# Patient Record
Sex: Female | Born: 1970 | Race: White | Hispanic: No | Marital: Married | Smoking: Current every day smoker
Health system: Southern US, Community
[De-identification: ages and names within clinical notes are randomized; demographics above are authoritative.]

## PROBLEM LIST (undated history)

## (undated) DIAGNOSIS — F329 Major depressive disorder, single episode, unspecified: Secondary | ICD-10-CM

## (undated) DIAGNOSIS — K219 Gastro-esophageal reflux disease without esophagitis: Secondary | ICD-10-CM

## (undated) DIAGNOSIS — J45909 Unspecified asthma, uncomplicated: Secondary | ICD-10-CM

## (undated) DIAGNOSIS — Z720 Tobacco use: Secondary | ICD-10-CM

## (undated) DIAGNOSIS — I10 Essential (primary) hypertension: Secondary | ICD-10-CM

## (undated) DIAGNOSIS — D649 Anemia, unspecified: Secondary | ICD-10-CM

## (undated) DIAGNOSIS — F419 Anxiety disorder, unspecified: Secondary | ICD-10-CM

## (undated) DIAGNOSIS — F32A Depression, unspecified: Secondary | ICD-10-CM

## (undated) HISTORY — DX: Anemia, unspecified: D64.9

## (undated) HISTORY — DX: Essential (primary) hypertension: I10

## (undated) HISTORY — DX: Tobacco use: Z72.0

---

## 2003-09-30 HISTORY — PX: OTHER SURGICAL HISTORY: SHX169

## 2007-04-22 ENCOUNTER — Ambulatory Visit: Payer: Self-pay | Admitting: Family Medicine

## 2007-04-22 DIAGNOSIS — R946 Abnormal results of thyroid function studies: Secondary | ICD-10-CM | POA: Insufficient documentation

## 2007-04-23 ENCOUNTER — Encounter: Payer: Self-pay | Admitting: Family Medicine

## 2007-04-24 ENCOUNTER — Emergency Department (HOSPITAL_COMMUNITY): Admission: EM | Admit: 2007-04-24 | Discharge: 2007-04-24 | Payer: Self-pay | Admitting: Emergency Medicine

## 2007-05-06 ENCOUNTER — Encounter: Payer: Self-pay | Admitting: Family Medicine

## 2007-05-07 ENCOUNTER — Ambulatory Visit: Payer: Self-pay | Admitting: Family Medicine

## 2007-05-08 ENCOUNTER — Encounter: Payer: Self-pay | Admitting: Family Medicine

## 2007-05-12 ENCOUNTER — Encounter: Payer: Self-pay | Admitting: Family Medicine

## 2007-05-12 LAB — CONVERTED CEMR LAB
AST: 14 units/L (ref 0–37)
BUN: 13 mg/dL (ref 6–23)
CO2: 22 meq/L (ref 19–32)
Calcium: 9 mg/dL (ref 8.4–10.5)
Chloride: 107 meq/L (ref 96–112)
Cholesterol: 170 mg/dL (ref 0–200)
Creatinine, Ser: 0.77 mg/dL (ref 0.40–1.20)
Glucose, Bld: 93 mg/dL (ref 70–99)
HDL goal, serum: 40 mg/dL
HDL: 40 mg/dL (ref >39)
Total CHOL/HDL Ratio: 4.3
Triglycerides: 192 mg/dL — ABNORMAL HIGH (ref <150)

## 2007-05-14 ENCOUNTER — Encounter: Payer: Self-pay | Admitting: Family Medicine

## 2007-05-14 ENCOUNTER — Other Ambulatory Visit: Admission: RE | Admit: 2007-05-14 | Discharge: 2007-05-14 | Payer: Self-pay | Admitting: Family Medicine

## 2007-05-14 ENCOUNTER — Ambulatory Visit: Payer: Self-pay | Admitting: Family Medicine

## 2007-05-15 ENCOUNTER — Encounter: Payer: Self-pay | Admitting: Family Medicine

## 2007-05-22 ENCOUNTER — Encounter: Payer: Self-pay | Admitting: Family Medicine

## 2007-07-01 ENCOUNTER — Ambulatory Visit: Payer: Self-pay | Admitting: Family Medicine

## 2007-07-01 DIAGNOSIS — F17200 Nicotine dependence, unspecified, uncomplicated: Secondary | ICD-10-CM | POA: Insufficient documentation

## 2007-07-01 DIAGNOSIS — K219 Gastro-esophageal reflux disease without esophagitis: Secondary | ICD-10-CM | POA: Insufficient documentation

## 2007-07-07 ENCOUNTER — Telehealth: Payer: Self-pay | Admitting: Family Medicine

## 2007-07-22 ENCOUNTER — Encounter: Admission: RE | Admit: 2007-07-22 | Discharge: 2007-07-22 | Payer: Self-pay | Admitting: Family Medicine

## 2007-07-22 ENCOUNTER — Ambulatory Visit: Payer: Self-pay | Admitting: Family Medicine

## 2007-07-22 DIAGNOSIS — M549 Dorsalgia, unspecified: Secondary | ICD-10-CM | POA: Insufficient documentation

## 2007-07-22 DIAGNOSIS — K644 Residual hemorrhoidal skin tags: Secondary | ICD-10-CM | POA: Insufficient documentation

## 2007-08-26 ENCOUNTER — Ambulatory Visit: Payer: Self-pay | Admitting: Family Medicine

## 2007-10-07 ENCOUNTER — Telehealth: Payer: Self-pay | Admitting: Family Medicine

## 2007-11-04 ENCOUNTER — Ambulatory Visit: Payer: Self-pay | Admitting: Family Medicine

## 2007-11-04 DIAGNOSIS — R5383 Other fatigue: Secondary | ICD-10-CM | POA: Insufficient documentation

## 2007-11-04 DIAGNOSIS — R5381 Other malaise: Secondary | ICD-10-CM | POA: Insufficient documentation

## 2007-11-04 DIAGNOSIS — E781 Pure hyperglyceridemia: Secondary | ICD-10-CM | POA: Insufficient documentation

## 2007-12-09 ENCOUNTER — Ambulatory Visit: Payer: Self-pay | Admitting: Family Medicine

## 2007-12-24 ENCOUNTER — Encounter: Payer: Self-pay | Admitting: Family Medicine

## 2007-12-26 LAB — CONVERTED CEMR LAB
Eosinophils Absolute: 0.3 10*3/uL (ref 0.0–0.7)
Eosinophils Relative: 4 % (ref 0–5)
HCT: 35.9 % — ABNORMAL LOW (ref 36.0–46.0)
Lymphocytes Relative: 28 % (ref 12–46)
Lymphs Abs: 2.2 10*3/uL (ref 0.7–4.0)
MCV: 84.3 fL (ref 78.0–100.0)
Monocytes Relative: 11 % (ref 3–12)
RBC: 4.26 M/uL (ref 3.87–5.11)
WBC: 7.8 10*3/uL (ref 4.0–10.5)

## 2008-02-03 ENCOUNTER — Ambulatory Visit: Payer: Self-pay | Admitting: Family Medicine

## 2008-02-06 ENCOUNTER — Encounter: Payer: Self-pay | Admitting: Family Medicine

## 2008-02-16 ENCOUNTER — Telehealth: Payer: Self-pay | Admitting: Family Medicine

## 2008-03-25 ENCOUNTER — Ambulatory Visit: Payer: Self-pay | Admitting: Family Medicine

## 2008-05-25 ENCOUNTER — Ambulatory Visit: Payer: Self-pay | Admitting: Family Medicine

## 2008-05-25 DIAGNOSIS — R7309 Other abnormal glucose: Secondary | ICD-10-CM | POA: Insufficient documentation

## 2008-05-25 LAB — CONVERTED CEMR LAB: Hgb A1c MFr Bld: 5.9 %

## 2008-06-07 ENCOUNTER — Telehealth: Payer: Self-pay | Admitting: Family Medicine

## 2008-06-09 ENCOUNTER — Ambulatory Visit: Payer: Self-pay | Admitting: Family Medicine

## 2008-06-09 LAB — CONVERTED CEMR LAB
Bilirubin Urine: NEGATIVE
Blood in Urine, dipstick: NEGATIVE
Glucose, Urine, Semiquant: 250
Nitrite: NEGATIVE
Specific Gravity, Urine: 1.02
Urobilinogen, UA: 0.2

## 2008-06-21 ENCOUNTER — Telehealth: Payer: Self-pay | Admitting: Family Medicine

## 2008-08-19 ENCOUNTER — Encounter: Payer: Self-pay | Admitting: Family Medicine

## 2008-08-19 ENCOUNTER — Other Ambulatory Visit: Admission: RE | Admit: 2008-08-19 | Discharge: 2008-08-19 | Payer: Self-pay | Admitting: Family Medicine

## 2008-08-19 ENCOUNTER — Ambulatory Visit: Payer: Self-pay | Admitting: Family Medicine

## 2008-08-19 LAB — CONVERTED CEMR LAB: Beta hcg, urine, semiquantitative: NEGATIVE

## 2008-08-19 LAB — HM PAP SMEAR

## 2008-08-24 ENCOUNTER — Telehealth: Payer: Self-pay | Admitting: Family Medicine

## 2008-10-28 ENCOUNTER — Ambulatory Visit: Payer: Self-pay | Admitting: Family Medicine

## 2008-12-09 ENCOUNTER — Ambulatory Visit: Payer: Self-pay | Admitting: Family Medicine

## 2008-12-09 ENCOUNTER — Telehealth (INDEPENDENT_AMBULATORY_CARE_PROVIDER_SITE_OTHER): Payer: Self-pay | Admitting: *Deleted

## 2008-12-09 LAB — CONVERTED CEMR LAB
Ketones, urine, test strip: NEGATIVE
Nitrite: NEGATIVE
Protein, U semiquant: NEGATIVE
WBC Urine, dipstick: NEGATIVE
pH: 6

## 2008-12-14 ENCOUNTER — Telehealth: Payer: Self-pay | Admitting: Family Medicine

## 2008-12-22 ENCOUNTER — Ambulatory Visit: Payer: Self-pay | Admitting: Family Medicine

## 2008-12-22 LAB — CONVERTED CEMR LAB
Bilirubin Urine: NEGATIVE
Nitrite: NEGATIVE
Protein, U semiquant: NEGATIVE
Urobilinogen, UA: 0.2
WBC Urine, dipstick: NEGATIVE
pH: 5.5

## 2008-12-23 ENCOUNTER — Encounter: Payer: Self-pay | Admitting: Family Medicine

## 2008-12-26 LAB — CONVERTED CEMR LAB
ALT: 14 units/L (ref 0–35)
BUN: 10 mg/dL (ref 6–23)
CO2: 21 meq/L (ref 19–32)
Calcium: 9.3 mg/dL (ref 8.4–10.5)
Chloride: 107 meq/L (ref 96–112)
Cholesterol: 169 mg/dL (ref 0–200)
Creatinine, Ser: 0.76 mg/dL (ref 0.40–1.20)
Glucose, Bld: 96 mg/dL (ref 70–99)
HDL: 33 mg/dL — ABNORMAL LOW (ref >39)
Hep B C IgM: NEGATIVE
RPR Ser Ql: REACTIVE — AB
T pallidum Antibodies (TP-PA): <0.1 (ref <1.0)
TSH: 1.039 microintl units/mL (ref 0.350–4.500)
Total Bilirubin: 0.4 mg/dL (ref 0.3–1.2)
Total CHOL/HDL Ratio: 5.1
Triglycerides: 190 mg/dL — ABNORMAL HIGH (ref <150)
VLDL: 38 mg/dL (ref 0–40)

## 2009-01-03 ENCOUNTER — Encounter: Admission: RE | Admit: 2009-01-03 | Discharge: 2009-01-03 | Payer: Self-pay | Admitting: Family Medicine

## 2009-01-10 ENCOUNTER — Telehealth: Payer: Self-pay | Admitting: Family Medicine

## 2009-04-20 ENCOUNTER — Ambulatory Visit: Payer: Self-pay | Admitting: Family Medicine

## 2009-04-20 LAB — CONVERTED CEMR LAB
Bilirubin Urine: NEGATIVE
Ketones, urine, test strip: NEGATIVE
Nitrite: NEGATIVE
Protein, U semiquant: NEGATIVE
pH: 6

## 2009-04-21 ENCOUNTER — Encounter: Payer: Self-pay | Admitting: Family Medicine

## 2009-05-09 ENCOUNTER — Ambulatory Visit: Payer: Self-pay | Admitting: Family Medicine

## 2009-05-09 LAB — CONVERTED CEMR LAB
Bilirubin Urine: NEGATIVE
Blood in Urine, dipstick: NEGATIVE
Glucose, Urine, Semiquant: NEGATIVE
Specific Gravity, Urine: 1.02
pH: 6.5

## 2009-05-10 ENCOUNTER — Encounter: Payer: Self-pay | Admitting: Family Medicine

## 2009-05-10 LAB — CONVERTED CEMR LAB
Clue Cells Wet Prep HPF POC: NONE SEEN
Trich, Wet Prep: NONE SEEN

## 2009-06-10 ENCOUNTER — Ambulatory Visit: Payer: Self-pay | Admitting: Family Medicine

## 2009-06-10 LAB — CONVERTED CEMR LAB
Nitrite: NEGATIVE
Protein, U semiquant: NEGATIVE
Urobilinogen, UA: 0.2
WBC Urine, dipstick: NEGATIVE

## 2009-07-04 ENCOUNTER — Telehealth: Payer: Self-pay | Admitting: Family Medicine

## 2009-07-07 ENCOUNTER — Ambulatory Visit (HOSPITAL_COMMUNITY): Admission: RE | Admit: 2009-07-07 | Discharge: 2009-07-07 | Payer: Self-pay | Admitting: Psychiatry

## 2009-07-12 ENCOUNTER — Telehealth: Payer: Self-pay | Admitting: Family

## 2009-07-13 ENCOUNTER — Ambulatory Visit (HOSPITAL_COMMUNITY): Payer: Self-pay | Admitting: Psychology

## 2009-07-22 ENCOUNTER — Ambulatory Visit: Payer: Self-pay | Admitting: Family Medicine

## 2009-07-22 ENCOUNTER — Encounter (INDEPENDENT_AMBULATORY_CARE_PROVIDER_SITE_OTHER): Payer: Self-pay | Admitting: *Deleted

## 2009-07-22 DIAGNOSIS — F332 Major depressive disorder, recurrent severe without psychotic features: Secondary | ICD-10-CM | POA: Insufficient documentation

## 2009-07-24 ENCOUNTER — Encounter: Payer: Self-pay | Admitting: Family Medicine

## 2009-07-27 ENCOUNTER — Encounter: Payer: Self-pay | Admitting: Family Medicine

## 2009-07-27 ENCOUNTER — Telehealth: Payer: Self-pay | Admitting: Family Medicine

## 2009-07-28 ENCOUNTER — Encounter: Payer: Self-pay | Admitting: Family Medicine

## 2009-08-03 ENCOUNTER — Telehealth (INDEPENDENT_AMBULATORY_CARE_PROVIDER_SITE_OTHER): Payer: Self-pay | Admitting: *Deleted

## 2009-08-09 ENCOUNTER — Ambulatory Visit (HOSPITAL_COMMUNITY): Payer: Self-pay | Admitting: Psychology

## 2009-08-22 ENCOUNTER — Ambulatory Visit: Payer: Self-pay | Admitting: Family Medicine

## 2009-08-23 ENCOUNTER — Ambulatory Visit (HOSPITAL_COMMUNITY): Payer: Self-pay | Admitting: Psychology

## 2009-09-06 ENCOUNTER — Ambulatory Visit (HOSPITAL_COMMUNITY): Payer: Self-pay | Admitting: Psychology

## 2009-09-09 ENCOUNTER — Encounter (INDEPENDENT_AMBULATORY_CARE_PROVIDER_SITE_OTHER): Payer: Self-pay | Admitting: *Deleted

## 2009-09-09 ENCOUNTER — Telehealth: Payer: Self-pay | Admitting: Family Medicine

## 2009-09-19 ENCOUNTER — Ambulatory Visit (HOSPITAL_COMMUNITY): Payer: Self-pay | Admitting: Psychology

## 2009-10-05 ENCOUNTER — Ambulatory Visit (HOSPITAL_COMMUNITY): Payer: Self-pay | Admitting: Psychiatry

## 2009-10-10 ENCOUNTER — Ambulatory Visit (HOSPITAL_COMMUNITY): Payer: Self-pay | Admitting: Psychology

## 2009-10-21 ENCOUNTER — Ambulatory Visit: Payer: Self-pay | Admitting: Family Medicine

## 2009-11-14 ENCOUNTER — Ambulatory Visit (HOSPITAL_COMMUNITY): Payer: Self-pay | Admitting: Psychology

## 2009-11-14 ENCOUNTER — Ambulatory Visit: Payer: Self-pay | Admitting: Family Medicine

## 2009-11-14 DIAGNOSIS — I10 Essential (primary) hypertension: Secondary | ICD-10-CM | POA: Insufficient documentation

## 2009-11-14 LAB — CONVERTED CEMR LAB
Nitrite: NEGATIVE
Protein, U semiquant: NEGATIVE
Urobilinogen, UA: 0.2
WBC Urine, dipstick: NEGATIVE
pH: 5.5

## 2009-11-15 ENCOUNTER — Encounter: Payer: Self-pay | Admitting: Family Medicine

## 2009-11-15 LAB — CONVERTED CEMR LAB

## 2009-12-16 ENCOUNTER — Ambulatory Visit (HOSPITAL_COMMUNITY): Payer: Self-pay | Admitting: Psychiatry

## 2010-01-16 ENCOUNTER — Ambulatory Visit (HOSPITAL_COMMUNITY): Payer: Self-pay | Admitting: Psychology

## 2010-01-17 ENCOUNTER — Ambulatory Visit: Payer: Self-pay | Admitting: Family Medicine

## 2010-02-14 ENCOUNTER — Ambulatory Visit (HOSPITAL_COMMUNITY): Admit: 2010-02-14 | Payer: Self-pay | Admitting: Psychology

## 2010-02-16 ENCOUNTER — Ambulatory Visit
Admission: RE | Admit: 2010-02-16 | Discharge: 2010-02-16 | Payer: Self-pay | Source: Home / Self Care | Admitting: Emergency Medicine

## 2010-02-17 ENCOUNTER — Ambulatory Visit (HOSPITAL_COMMUNITY)
Admission: RE | Admit: 2010-02-17 | Discharge: 2010-02-17 | Payer: Self-pay | Source: Home / Self Care | Attending: Psychiatry | Admitting: Psychiatry

## 2010-02-17 ENCOUNTER — Encounter: Payer: Self-pay | Admitting: Emergency Medicine

## 2010-02-19 ENCOUNTER — Encounter: Payer: Self-pay | Admitting: Family Medicine

## 2010-02-28 NOTE — Progress Notes (Signed)
Summary: pt needs a prior auth  Phone Note Call from Patient   Caller: Patient Summary of Call: Pt states that her her Effexor XR needs a prior Authorization [facility] [place] [facility] [address] Initial call taken by: Michaelle Copas,  August 03, 2009 3:44 PM  Follow-up for Phone Call        This was already completed  last week by Selena Batten Follow-up by: Payton Spark CMA,  August 04, 2009 1:49 PM

## 2010-02-28 NOTE — Consult Note (Signed)
Summary: [facility]  [facility]   Imported By: Lanelle Bal 08/18/2009 09:12:30  _____________________________________________________________________  External Attachment:    Type:   Image     Comment:   External Document

## 2010-02-28 NOTE — Letter (Signed)
Summary: Depression Questionnaire  Depression Questionnaire   Imported By: Lanelle Bal 08/29/2009 11:33:34  _____________________________________________________________________  External Attachment:    Type:   Image     Comment:   External Document

## 2010-02-28 NOTE — Progress Notes (Signed)
Summary: Question  Phone Note Call from Patient   Caller: Patient Summary of Call: Dr.Metheney Patient saw Dr.Bowen while Dr.Metheney was out, Patient was out on FMLA/short term dis and she need a Note to return back to work ..she is not sure if she need an appt or can she just get a note    [phone] Initial call taken by: Vanessa Swaziland,  September 09, 2009 1:08 PM  Follow-up for Phone Call        You can do a note. Just double check the date with Dr. Senaida Lange note. Does need a f/u at somepoint if not already scheduled.  Follow-up by: Nani Gasser MD,  September 09, 2009 1:53 PM  Additional Follow-up for Phone Call Additional follow up Details #1::        Letter done. Pt notified can pick up. Additional Follow-up by: Kathlene November,  September 09, 2009 2:04 PM

## 2010-02-28 NOTE — Letter (Signed)
Summary: Primary Care Consult Scheduled Letter  [facility] [place]  [address]   [place], [place] [address]   Phone: [phone]  Fax: [phone]      07/22/2009 MRN: [id number]  Brooke Kaiser [address] [place], [place]  [address]    Dear Brooke Kaiser,    We have scheduled an appointment for you.  At the recommendation of Dr.Bowen, we have scheduled you a consult with [facility] on June 30th  at 9:45.  Their address is [address] [place], [place] [address]. The office phone number is [phone].  If this appointment day and time is not convenient for you, please feel free to call the office of the doctor you are being referred to at the number listed above and reschedule the appointment.     It is important for you to keep your scheduled appointments. We are here to make sure you are given good patient care.    Thank you, Michaelle Copas #[phone] Patient Care Coordinator [facility] Kathryne Sharper

## 2010-02-28 NOTE — Assessment & Plan Note (Signed)
Summary: HTN, dysuria   Vital Signs:  Patient profile:   40 year old female Height:      68 inches Weight:      214 pounds Pulse rate:   84 / minute BP sitting:   146 / 88  (right arm) Cuff size:   regular  Vitals Entered By: Avon Gully CMA, Duncan Dull) (November 14, 2009 1:14 PM) CC: f/u bp at work it was 154/65 and has been around 142-145/75-80,also has burning with urination x several days and white thick discharge   Primary Care Shadasia [place]:  Nani Gasser MD  CC:  f/u bp at work it was 154/65 and has been around 142-145/75-80 and also has burning with urination x several days and white thick discharge.  History of Present Illness: f/u bp at work it was 154/65 and has been around 142-145/75-80,also has burning with urination x several days and white thick discharge  Current Medications (verified): 1)  Alprazolam 0.5 Mg Tabs (Alprazolam) .... Take 1 Tablet By Mouth Once A Day As Needed Anxiety 2)  Proair Hfa 108 (90 Base) Mcg/act  Aers (Albuterol Sulfate) .... 2-4 Puffs Inhaled Two Times A Day As Needed 3)  Pantoprazole Sodium 40 Mg Tbec (Pantoprazole Sodium) .... Take 1 Tablet By Mouth Once A Day 4)  Effexor Xr 150 Mg Xr24h-Cap (Venlafaxine Hcl) .... Take 1 Tablet By Mouth Once A Day  Allergies (verified): 1)  ! Codeine 2)  ! Augmentin  Comments:  Nurse/Medical Assistant: The patient's medications and allergies were reviewed with the patient and were updated in the Medication and Allergy Lists. Avon Gully CMA, Duncan Dull) (November 14, 2009 1:18 PM)  Physical Exam  General:  Well-developed,well-nourished,in no acute distress; alert,appropriate and cooperative throughout examination.  Lungs:  Normal respiratory effort, chest expands symmetrically. Lungs are clear to auscultation, no crackles or wheezes. Heart:  Normal rate and regular rhythm. S1 and S2 normal without gallop, murmur, click, rub or other extra sounds. Abdomen:  Umbilical peircing.  Skin:  no rashes.     Cervical Nodes:  No lymphadenopathy noted Psych:  Cognition and judgment appear intact. Alert and cooperative with normal attention span and concentration. No apparent delusions, illusions, hallucinations   Impression & Recommendations:  Problem # 1:  HYPERTENSION, BENIGN (ICD-401.1) She is working on quitting smoking. Start BP medication. Warned of potential SE.  Call if any concerns.  F/U in 6 weeks.  Her updated medication list for this problem includes:    Hydrochlorothiazide 25 Mg Tabs (Hydrochlorothiazide) .Marland Kitchen... Take 1 tablet by mouth once a day  Problem # 2:  DYSURIA (ICD-788.1) U/A is neg but started taking cranberry so will send a culture.   Will send wet prep as well since noticed a thick vaginal d/c.  Orders: T-Urine Culture (Spectrum Order) [id number]) T-Wet Prep for Christoper Allegra, Clue Cells [id number]) UA Dipstick w/o Micro (automated)  (81003)  Complete Medication List: 1)  Alprazolam 0.5 Mg Tabs (Alprazolam) .... Take 1 tablet by mouth once a day as needed anxiety 2)  Proair Hfa 108 (90 Base) Mcg/act Aers (Albuterol sulfate) .... 2-4 puffs inhaled two times a day as needed 3)  Pantoprazole Sodium 40 Mg Tbec (Pantoprazole sodium) .... Take 1 tablet by mouth once a day 4)  Effexor Xr 150 Mg Xr24h-cap (Venlafaxine hcl) .... Take 1 tablet by mouth once a day 5)  Hydrochlorothiazide 25 Mg Tabs (Hydrochlorothiazide) .... Take 1 tablet by mouth once a day  Patient Instructions: 1)  Google the DASH diet ( .nih/gov website) 2)  Regular exercse  ( days a week) and weight loss and smoking cessation will help the blood pressure. Also avoid caffeine.  3)  Please schedule a follow-up appointment in 6 weeks.  Prescriptions: HYDROCHLOROTHIAZIDE 25 MG TABS (HYDROCHLOROTHIAZIDE) Take 1 tablet by mouth once a day  #30 x 1   Entered and Authorized by:   Nani Gasser MD   Signed by:   Nani Gasser MD on 11/14/2009   Method used:   [facility]        [facility]  [place] [address]       [address]       [place], [place]  [address]       Ph: [phone] or [phone]       Fax: [phone]   RxID:   [id number]    Orders Added: 1)  T-Urine Culture (Spectrum Order) [[id number]] 2)  T-Wet Prep for Christoper Allegra, Clue Cells [phone] 3)  UA Dipstick w/o Micro (automated)  [81003] 4)  Est. Patient Level III [[address]]    Laboratory Results   Urine Tests  Date/Time Received: 11/14/09 Date/Time Reported: 11/14/09  Routine Urinalysis   Color: yellow Appearance: Clear Glucose: negative   (Normal Range: Negative) Bilirubin: negative   (Normal Range: Negative) Ketone: negative   (Normal Range: Negative) Spec. Gravity: >=1.030   (Normal Range: 1.003-1.035) Blood: negative   (Normal Range: Negative) pH: 5.5   (Normal Range: 5.0-8.0) Protein: negative   (Normal Range: Negative) Urobilinogen: 0.2   (Normal Range: 0-1) Nitrite: negative   (Normal Range: Negative) Leukocyte Esterace: negative   (Normal Range: Negative)

## 2010-02-28 NOTE — Letter (Signed)
Summary: Generic Letter  [facility] [place]  [address]   [place], [place] [address]   Phone: [phone]  Fax: [phone]    07/24/2009  Brooke Kaiser [address] [place], [place]  [address]  To Whom It May Concern,  Brooke Kaiser will be expected to be out of work from June 13th, 2011 until August 13th, 2011 due to severe depression with self mutilating behavior.  She has started the recovery process with psychiatric counseling and medications.  Based on clinical data and how this patient presents, I expect her to take two months to show clinical remission in order to return to work.        Sincerely,     Seymour Bars DO

## 2010-02-28 NOTE — Progress Notes (Signed)
Summary: referral  Phone Note Call from Patient   Caller: Patient Summary of Call: Patient said she need a Podiatry referral Initial call taken by: Vanessa Swaziland,  July 12, 2009 2:38 PM  Follow-up for Phone Call        Could you please ask patient to let us know why she needs referral? Follow-up by: Lemont Fillers FNP,  July 12, 2009 3:38 PM  Additional Follow-up for Phone Call Additional follow up Details #1::        [facility] for pt to return call. KJ LPN Additional Follow-up by: Kathlene November,  July 12, 2009 3:52 PM    Additional Follow-up for Phone Call Additional follow up Details #2::    Pt has not returned call. Will file in chart and await for pt to call if still wants a referral Follow-up by: Kathlene November,  July 14, 2009 8:55 AM

## 2010-02-28 NOTE — Assessment & Plan Note (Signed)
Summary: f/u mood   Vital Signs:  Patient profile:   40 year old female Height:      68 inches Weight:      210 pounds BMI:     32.05 O2 Sat:      99 % on Room air Pulse rate:   61 / minute BP sitting:   117 / 75  (left arm) Cuff size:   large  Vitals Entered By: Payton Spark CMA (July 22, 2009 9:43 AM)  O2 Flow:  Room air CC: Depression and anxiety. Also c/o painful wart on bottom of R foot.    Primary Care Provider:  Nani Gasser MD  CC:  Depression and anxiety. Also c/o painful wart on bottom of R foot. Brooke Kaiser  History of Present Illness: 40 yo WF presents for a painful wart on the bottom of her R foot that is getting worse.  She has not seen a podiatrist yet.  She reports having an emotional meltdown at work and doing some self harm.  She was admitted to [facility].  She was taking Cymbalta a year ago but stopped it due to cost about a year ago.  She takes Xanax 1-2 x a day as needed.    She reports cutting herself and was only at [facility] for a few hrs.  She was set up for outpt help but she did not go due to cost.  She has set up her own counseling that is covered thru Aug.  She would like to get back to work in Aug.    She has a roommate now that she is happy with.   June 10th -- went to [facility]    Current Medications (verified): 1)  Alprazolam 0.5 Mg Tabs (Alprazolam) .... Take 1 Tablet By Mouth Once A Day As Needed Anxiety 2)  Proair Hfa 108 (90 Base) Mcg/act  Aers (Albuterol Sulfate) .... 2-4 Puffs Inhaled Two Times A Day As Needed 3)  Pantoprazole Sodium 40 Mg Tbec (Pantoprazole Sodium) .... Take 1 Tablet By Mouth Once A Day  Allergies (verified): 1)  ! Codeine 2)  ! Augmentin  Review of Systems Psych:  Complains of anxiety, depression, easily angered, and irritability; denies alternate hallucination ( auditory/visual), suicidal thoughts/plans, thoughts of violence, unusual visions or sounds, and thoughts /plans of harming others.  Physical  Exam  General:  alert, well-developed, well-nourished, and well-hydrated.   Head:  normocephalic and atraumatic.   Mouth:  pharynx pink and moist.   Neck:  no masses.   Lungs:  Normal respiratory effort, chest expands symmetrically. Lungs are clear to auscultation, no crackles or wheezes. Heart:  Normal rate and regular rhythm. S1 and S2 normal without gallop, murmur, click, rub or other extra sounds. Skin:  large corn on the bottom of the R foot with callus over the medal arch Psych:  poor eye contact and labile affect.     Impression & Recommendations:  Problem # 1:  DEPRESSION, SEVERE (ICD-311) Reviewed her history of having a 'meltdown' with a cutting episode to her L wrist on the 10th of June.  She has been out of work since the 13th and spent a few hrs at [employer].  She is set up with counseling which she started on Monday, has some time off work and is in a better roomate relationship now.  She is not abusing her Xanax (1 per day).  Admits that she needs to get back on meds.  Will start generic effexor since  cost is an issue.  Call if ANY worsening in mood.  RTC in1 month and will do a PHQ-9. Her updated medication list for this problem includes:    Alprazolam 0.5 Mg Tabs (Alprazolam) .Brooke Kaiser... Take 1 tablet by mouth once a day as needed anxiety    Venlafaxine Hcl 37.5 Mg Tabs (Venlafaxine hcl) .Brooke Kaiser... 1 tab by mouth two times a day x 1 wk then increase to 2 tabs by mouth bid  Complete Medication List: 1)  Alprazolam 0.5 Mg Tabs (Alprazolam) .... Take 1 tablet by mouth once a day as needed anxiety 2)  Proair Hfa 108 (90 Base) Mcg/act Aers (Albuterol sulfate) .... 2-4 puffs inhaled two times a day as needed 3)  Pantoprazole Sodium 40 Mg Tbec (Pantoprazole sodium) .... Take 1 tablet by mouth once a day 4)  Venlafaxine Hcl 37.5 Mg Tabs (Venlafaxine hcl) .Brooke Kaiser.. 1 tab by mouth two times a day x 1 wk then increase to 2 tabs by mouth bid  Other Orders: Podiatry Referral (Podiatry)  Patient  Instructions: 1)  Start generic effexor - take 1 tab twice a day for 1 wk then go up to 2 tabs by mouth twice a day.  This is for depression and anxiety. 2)  Use Alprazolam no more than 1 tab each day. 3)  Continue counseling. 4)  Call if any worsening in mood. 5)  Will complete your FMLA next wk.  Will call you to pick up. 6)  I will get you in with podiatry for your corn. 7)  Follow up in 1 month for mood. Prescriptions: ALPRAZOLAM 0.5 MG TABS (ALPRAZOLAM) Take 1 tablet by mouth once a day as needed anxiety  #30 x 0   Entered and Authorized by:   Seymour Bars DO   Signed by:   Seymour Bars DO on 07/22/2009   Method used:   Printed then [facility]ed [facility] ...       [facility] [address] (retail)       [address]       [place], [place]  [address]       Ph: [phone] or [phone]       Fax: [phone]   RxID:   [id number] VENLAFAXINE HCL 37.5 MG TABS (VENLAFAXINE HCL) 1 tab by mouth two times a day x 1 wk then increase to 2 tabs by mouth bid  #120 x 0   Entered and Authorized by:   Seymour Bars DO   Signed by:   Seymour Bars DO on 07/22/2009   Method used:   [facility] to        [facility] [phone][facility] (retail)       [address]       [place], [place]  [address]       Ph: [phone] or [phone]       Fax: [phone]   RxID:   [id number]   Appended Document: f/u mood

## 2010-02-28 NOTE — Assessment & Plan Note (Signed)
Summary: SOB/DIZZY/CHEST PAIN   Vital Signs:  Patient Profile:   40 Years Old Female CC:      dizziness, CP intermittent, SOB X 1 week Height:     68 inches Weight:      221 pounds O2 Sat:      98 % O2 treatment:    Room Air Temp:     97.5 degrees F oral Pulse rate:   70 / minute Pulse rhythm:   regular Resp:     14 per minute BP sitting:   138 / 89  (right arm) Cuff size:   large  Pt. in pain?   yes    Location:   chest    Intensity:   6    Type:       sharp  Vitals Entered By: Lajean Saver, RN  (Jun 10, 2009 3:38 PM)                   Updated Prior Medication List: ALPRAZOLAM 0.5 MG TABS (ALPRAZOLAM) Take 1 tablet by mouth once a day as needed anxiety PROAIR HFA 108 (90 BASE) MCG/ACT  AERS (ALBUTEROL SULFATE) 2-4 puffs inhaled two times a day as needed PANTOPRAZOLE SODIUM 40 MG TBEC (PANTOPRAZOLE SODIUM) Take 1 tablet by mouth once a day  Current Allergies (reviewed today): ! CODEINE ! AUGMENTINHistory of Present Illness Chief Complaint: dizziness, CP intermittent, SOB X 1 week History of Present Illness: Subjective:  Patient complains of feeling dizzy today, described as a spinning sensation.  No neuro symptoms.  No fevers, chills, and sweats  She states that she has had 3 episodes of left lower chest/flank pain during the past 3 weeks.  Each episode consisted of a series of sharp stabbing pains lasting several seconds, and the entire series of pains lasted about 30 minutes.  She was at rest when these episodes occurred.  She has felt fatigued.  No respiratory, GI, or GU symptoms.  No rash  REVIEW OF SYSTEMS Constitutional Symptoms      Denies fever, chills, night sweats, weight loss, weight gain, and fatigue.      Comments: dizziness Eyes       Denies change in vision, eye pain, eye discharge, glasses, contact lenses, and eye surgery. Ear/Nose/Throat/Mouth       Denies hearing loss/aids, change in hearing, ear pain, ear discharge, dizziness, frequent runny nose,  frequent nose bleeds, sinus problems, sore throat, hoarseness, and tooth pain or bleeding.  Respiratory       Complains of shortness of breath.      Denies dry cough, productive cough, wheezing, asthma, bronchitis, and emphysema/COPD.  Cardiovascular       Complains of chest pain.      Denies murmurs and tires easily with exhertion.      Comments: sharp/intermittent, under left breast   Gastrointestinal       Denies stomach pain, nausea/vomiting, diarrhea, constipation, blood in bowel movements, and indigestion. Genitourniary       Denies painful urination, kidney stones, and loss of urinary control. Neurological       Denies paralysis, seizures, and fainting/blackouts. Musculoskeletal       Complains of muscle pain.      Denies joint pain, joint stiffness, decreased range of motion, redness, swelling, muscle weakness, and gout.      Comments: dull left shoulder Skin       Denies bruising, unusual mles/lumps or sores, and hair/skin or nail changes.  Psych       Denies  mood changes, temper/anger issues, anxiety/stress, speech problems, depression, and sleep problems. Other Comments: symtpoms intermittent X 1 week, 6 years ago had tumor removed form left chest   Past History:  Past Medical History: ABnormal thyroid test  Past Surgical History: Reviewed history from 07/22/2007 and no changes required. 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   Aug 2005  Family History: Reviewed history from 04/20/2009 and no changes required. Father with alcoholism Mother with Breast Cancer at 56 (premenopausal), colon polyps Father with depression GM wiht thyroid d/o M Uncle with cancerous colon polyps.  GM with heart dz.   Social History: Reviewed history from 05/07/2007 and no changes required. 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  Lives with her sister and her 4 kids parttime.   Current Smoker Alcohol use-yes Drug  use-yes Regular exercise-no   Objective:  No acute distress  Eyes:  Pupils are equal, round, and reactive to light and accomdation.  Extraocular movement is intact.  Conjunctivae are not inflamed.  Ears:  Canals normal.  Tympanic membranes normal.   Nose:  Normal septum.  Normal turbinates, mildly congested.   No sinus tenderness present.  Pharynx:  Normal  Neck:  Supple.  No adenopathy is present.  No thyromegaly is present  Lungs:  Clear to auscultation.  Breath sounds are equal.  Heart:  Regular rate and rhythm without murmurs, rubs, or gallops.  Chest:  No tenderness to palpation Abdomen:  Nontender without masses or hepatosplenomegaly.  Bowel sounds are present.  No CVA or flank tenderness.  Extremities:  No edema.  Pedal pulses are full and equal.  Skin:  No rash urinalysis (dipstick):  negative EKG:  Normal Chest X-ray:  Negative Assessment New Problems: CHEST PAIN, LEFT (ICD-786.50) BENIGN PAROXYSMAL POSITIONAL VERTIGO (ICD-386.11)  Suspect musculoskeletal pain.  ? Early shingles.  Vertigo  Plan New Medications/Changes: MECLIZINE HCL 25 MG TABS (MECLIZINE HCL) One by mouth two times a day to three times a day as needed for dizziness  #15 x 1, 06/10/2009, Donna Christen MD  New Orders: Urinalysis [81003-65000] T-Chest x-ray, 2 views [71020] New Patient Level III [[address]] EKG w/ Interpretation [93000] Planning Comments:   Begin meclizine. Follow-up with PCP if pain increases or increased symptoms develop. Call if rash develops at pain site (start Valtrex).   The patient and/or caregiver has been counseled thoroughly with regard to medications prescribed including dosage, schedule, interactions, rationale for use, and possible side effects and they verbalize understanding.  Diagnoses and expected course of recovery discussed and will return if not improved as expected or if the condition worsens. Patient and/or caregiver verbalized understanding.   Prescriptions: MECLIZINE HCL 25 MG TABS (MECLIZINE HCL) One by mouth two times a day to three times a day as needed for dizziness  #15 x 1   Entered and Authorized by:   Donna Christen MD   Signed by:   Donna Christen MD on 06/10/2009   Method used:   Print then Give to Patient   RxID:   [id number]   Laboratory Results   Urine Tests  Date/Time Received: Jun 10, 2009 4:21 PM  Date/Time Reported: Jun 10, 2009 4:21 PM   Routine Urinalysis   Color: yellow Appearance: Clear Glucose: negative   (Normal Range: Negative) Bilirubin: negative   (Normal Range: Negative) Ketone: negative   (Normal Range: Negative) Spec. Gravity: 1.015   (Normal Range: 1.003-1.035) Blood: negative   (Normal Range:  Negative) pH: 6.5   (Normal Range: 5.0-8.0) Protein: negative   (Normal Range: Negative) Urobilinogen: 0.2   (Normal Range: 0-1) Nitrite: negative   (Normal Range: Negative) Leukocyte Esterace: negative   (Normal Range: Negative)

## 2010-02-28 NOTE — Medication Information (Signed)
Summary: Approval for Venlafaxine/[employer]  Approval for Venlafaxine/[employer]   Imported By: Lanelle Bal 08/04/2009 13:22:10  _____________________________________________________________________  External Attachment:    Type:   Image     Comment:   External Document
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
## 2010-02-28 NOTE — Assessment & Plan Note (Signed)
Summary: LUMP ON HEEL & URINARY PROB   Vital Signs:  Patient profile:   40 year old female Height:      68 inches Weight:      222 pounds BMI:     33.88 Pulse rate:   86 / minute BP sitting:   128 / 73  (left arm) Cuff size:   large  Vitals Entered By: Kathlene November (April 20, 2009 9:54 AM) CC: urinary frequency, dysuria and odor to urine for 2 weeks now. Also has lump on right heel   Primary Care Provider:  Linford Arnold, C  CC:  urinary frequency and dysuria and odor to urine for 2 weeks now. Also has lump on right heel.  History of Present Illness: urinary frequency, dysuria and odor to urine for 2 weeks now. No new back pain. NO fever.  Occ pain in the LLQ pain.  Feels her urine smells. Was on ABX about a month ago, amox for an abscessed tooth.   Also has lump on right heel.  Noticed it about 2 weeks ago.  ABout 1 month ago hit his foot on metal bar.  WAs bruised for a couple of days.  Hurt for only about 4 days. Then 2 weeks ago noticed some tenderness again.    Current Medications (verified): 1)  Alprazolam 0.5 Mg Tabs (Alprazolam) .... Take 1 Tablet By Mouth Once A Day As Needed Anxiety 2)  Proair Hfa 108 (90 Base) Mcg/act  Aers (Albuterol Sulfate) .... 2-4 Puffs Inhaled Two Times A Day As Needed  Allergies (verified): 1)  ! Codeine 2)  ! Augmentin  Comments:  Nurse/Medical Assistant: The patient's medications and allergies were reviewed with the patient and were updated in the Medication and Allergy Lists. Kathlene November (April 20, 2009 9:55 AM)  Family History: Father with alcoholism Mother with Breast Cancer at 64 (premenopausal), colon polyps Father with depression GM wiht thyroid d/o M Uncle with cancerous colon polyps.  GM with heart dz.   Physical Exam  General:  Well-developed,well-nourished,in no acute distress; alert,appropriate and cooperative throughout examination Skin:  Right heel with a  corn. No sign of infection or inflamation.    Impression &  Recommendations:  Problem # 1:  FREQUENCY, URINARY (ICD-788.41) UA only + for blood so will send for culture.     Orders: UA Dipstick w/o Micro (automated)  (81003) T-Urine Culture (Spectrum Order) [id number])  Problem # 2:  BREAST CANCER, FAMILY HX (ICD-V16.3) Start yearly screening at 55 (this year) and will find out if moms BrCa was postmenopausal.  Mom was premenopausal at diagnosis at 37.   Problem # 3:  CORNS AND CALLOSITIES (ICD-700) Discussed OTC treatments with corn pads and topical medication. If not improving please let me know.   Complete Medication List: 1)  Alprazolam 0.5 Mg Tabs (Alprazolam) .... Take 1 tablet by mouth once a day as needed anxiety 2)  Proair Hfa 108 (90 Base) Mcg/act Aers (Albuterol sulfate) .... 2-4 puffs inhaled two times a day as needed 3)  Pantoprazole Sodium 40 Mg Tbec (Pantoprazole sodium) .... Take 1 tablet by mouth once a day Prescriptions: PANTOPRAZOLE SODIUM 40 MG TBEC (PANTOPRAZOLE SODIUM) Take 1 tablet by mouth once a day  #30 x 4   Entered and Authorized by:   Nani Gasser MD   Signed by:   Nani Gasser MD on 04/20/2009   Method used:   [facility]        [facility]  [place] [address]  [address]       [place], [place]  [address]       Ph: [phone] or [phone]       Fax: [phone]   RxID:   [id number]   Laboratory Results   Urine Tests  Date/Time Received: 04/20/2009 Date/Time Reported: 04/20/2009  Routine Urinalysis   Color: yellow Appearance: Clear Glucose: negative   (Normal Range: Negative) Bilirubin: negative   (Normal Range: Negative) Ketone: negative   (Normal Range: Negative) Spec. Gravity: 1.020   (Normal Range: 1.003-1.035) Blood: trace-intact   (Normal Range: Negative) pH: 6.0   (Normal Range: 5.0-8.0) Protein: negative   (Normal Range: Negative) Urobilinogen: 0.2   (Normal Range: 0-1) Nitrite: negative   (Normal Range: Negative) Leukocyte Esterace: negative   (Normal  Range: Negative)              Prevention & Chronic Care Immunizations   Influenza vaccine: Fluvax 3+  (11/04/2007)   Influenza vaccine deferral: patient declined  (12/09/2008)   Influenza vaccine due: 11/03/2008    Tetanus booster: 12/09/2008: Tdap   Tetanus booster due: 12/10/2018    Pneumococcal vaccine: Not documented  Other Screening   Pap smear: Normal  (08/24/2008)   Pap smear due: 08/2010   Smoking status: current  (04/22/2007)  Lipids   Total Cholesterol: 169  (12/23/2008)   LDL: 98  (12/23/2008)   LDL Direct: Not documented   HDL: 33  (12/23/2008)   Triglycerides: 190  (12/23/2008)    SGOT (AST): 14  (12/23/2008)   SGPT (ALT): 14  (12/23/2008)   Alkaline phosphatase: 51  (12/23/2008)   Total bilirubin: 0.4  (12/23/2008)  Self-Management Support :    Lipid self-management support: Not documented
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
## 2010-02-28 NOTE — Assessment & Plan Note (Signed)
Summary: Urinary freq   Vital Signs:  Patient profile:   40 year old female Height:      68 inches Weight:      219 pounds Temp:     97.8 degrees F oral Pulse rate:   63 / minute BP sitting:   113 / 73  (left arm) Cuff size:   large  Vitals Entered By: Kathlene November (May 09, 2009 9:09 AM) CC: urinary frequency, odor to urine, some itching, left sided flank and back pain, nocturia   Primary Care Provider:  Linford Arnold, C  CC:  urinary frequency, odor to urine, some itching, left sided flank and back pain, and nocturia.  History of Present Illness: Some Pelvic pressure. Just completed her menses. Having frequency and burning.  No change in vaginal discharge. Deneis any vaginal sxs.   Urine has odor.  Heavy caffine intake and soda intake.  Feels well hydrated overall. Has been trying to drink more water than usual.    Current Medications (verified): 1)  Alprazolam 0.5 Mg Tabs (Alprazolam) .... Take 1 Tablet By Mouth Once A Day As Needed Anxiety 2)  Proair Hfa 108 (90 Base) Mcg/act  Aers (Albuterol Sulfate) .... 2-4 Puffs Inhaled Two Times A Day As Needed 3)  Pantoprazole Sodium 40 Mg Tbec (Pantoprazole Sodium) .... Take 1 Tablet By Mouth Once A Day  Allergies (verified): 1)  ! Codeine 2)  ! Augmentin  Comments:  Nurse/Medical Assistant: The patient's medications and allergies were reviewed with the patient and were updated in the Medication and Allergy Lists. Kathlene November (May 09, 2009 9:10 AM)  Physical Exam  General:  Well-developed,well-nourished,in no acute distress; alert,appropriate and cooperative throughout examination   Impression & Recommendations:  Problem # 1:  FREQUENCY, URINARY (ICD-788.41)  Will send another culture. Last cx was neg and I did not traet.  If neg then will refer to Urology for possible IC evaluation. Dsicussed potential dx of IC.   Will go ahead and traet wtih UTI, though UA is clear today. Pt to let me know if she feels better.  She had a  pelvic US in December that showed a cyst on her left ovary.   Orders: UA Dipstick w/o Micro (automated)  (81003) T-Urine Culture (Spectrum Order) [id number])  Complete Medication List: 1)  Alprazolam 0.5 Mg Tabs (Alprazolam) .... Take 1 tablet by mouth once a day as needed anxiety 2)  Proair Hfa 108 (90 Base) Mcg/act Aers (Albuterol sulfate) .... 2-4 puffs inhaled two times a day as needed 3)  Pantoprazole Sodium 40 Mg Tbec (Pantoprazole sodium) .... Take 1 tablet by mouth once a day 4)  Ciprofloxacin Hcl 500 Mg Tabs (Ciprofloxacin hcl) .... Take 1 tablet by mouth two times a day for 3 days Prescriptions: CIPROFLOXACIN HCL 500 MG TABS (CIPROFLOXACIN HCL) Take 1 tablet by mouth two times a day for 3 days  #6 x 0   Entered and Authorized by:   Nani Gasser MD   Signed by:   Nani Gasser MD on 05/09/2009   Method used:   [facility]        [facility]  [place] [address]       [address]       [place], [place]  [address]       Ph: [phone] or [phone]       Fax: [phone]   RxID:   [id number]   Laboratory Results   Urine Tests  Date/Time Received: 05/09/2009 Date/Time Reported: 05/09/2009  Routine Urinalysis   Color: yellow Appearance: Clear Glucose: negative   (Normal Range: Negative) Bilirubin: negative   (Normal Range: Negative) Ketone: negative   (Normal Range: Negative) Spec. Gravity: 1.020   (Normal Range: 1.003-1.035) Blood: negative   (Normal Range: Negative) pH: 6.5   (Normal Range: 5.0-8.0) Protein: negative   (Normal Range: Negative) Urobilinogen: 0.2   (Normal Range: 0-1) Nitrite: negative   (Normal Range: Negative) Leukocyte Esterace: negative   (Normal Range: Negative)     Blood Tests   Date/Time Received: 05/09/2009 Date/Time Reported: 05/09/2009       Appended Document: Urinary freq

## 2010-02-28 NOTE — Assessment & Plan Note (Signed)
Summary: URI   Vital Signs:  Patient profile:   40 year old female Height:      68 inches Weight:      209 pounds Temp:     98.9 degrees F Pulse rate:   64 / minute BP sitting:   154 / 86  (right arm) Cuff size:   regular  Vitals Entered By: Avon Gully CMA, Duncan Dull) (October 21, 2009 2:11 PM) CC: congestion,productive cough,dizziness   Primary Care Provider:  Nani Gasser MD  CC:  congestion, productive cough, and dizziness.  History of Present Illness: congestion,productive cough,dizziness x 7days. Started with vomiting and fever. Fever now resolved but the cough is getting worse. Taking Dayquail, IBU and sudafed.  No GI sxs.  Phlegm is brown and green. Is a smoker. Started using her inhaler this week. Al little SOB the last 3 days.  No pressure. Nasal cogestion, daily HA. ANt cerv LN are tender.   Current Medications (verified): 1)  Alprazolam 0.5 Mg Tabs (Alprazolam) .... Take 1 Tablet By Mouth Once A Day As Needed Anxiety 2)  Proair Hfa 108 (90 Base) Mcg/act  Aers (Albuterol Sulfate) .... 2-4 Puffs Inhaled Two Times A Day As Needed 3)  Pantoprazole Sodium 40 Mg Tbec (Pantoprazole Sodium) .... Take 1 Tablet By Mouth Once A Day 4)  Effexor Xr 150 Mg Xr24h-Cap (Venlafaxine Hcl) .... Take 1 Tablet By Mouth Once A Day  Allergies (verified): 1)  ! Codeine 2)  ! Augmentin  Comments:  Nurse/Medical Assistant: The patient's medications were reviewed with the patient's parent and were updated in the Medication List. Avon Gully CMA, Duncan Dull) (October 21, 2009 2:12 PM)  Physical Exam  General:  Well-developed,well-nourished,in no acute distress; alert,appropriate and cooperative throughout examination Head:  Normocephalic and atraumatic without obvious abnormalities. No apparent alopecia or balding. Eyes:  No corneal or conjunctival inflammation noted. EOMI. Perrla.  Ears:  External ear exam shows no significant lesions or deformities.  Otoscopic examination  reveals clear canals, tympanic membranes are intact bilaterally without bulging, retraction, inflammation or discharge. Hearing is grossly normal bilaterally. Nose:  External nasal examination shows no deformity or inflammation. Nasal mucosa are pink and moist without lesions or exudates. Mouth:  Oral mucosa and oropharynx without lesions or exudates.  Teeth in good repair. Neck:  No deformities, masses, or tenderness noted. Lungs:  Normal respiratory effort, chest expands symmetrically. Lungs are clear to auscultation, no crackles or wheezes. Heart:  Normal rate and regular rhythm. S1 and S2 normal without gallop, murmur, click, rub or other extra sounds. Skin:  no rashes.   Cervical Nodes:  no anterior cervical adenopathy.   Psych:  Cognition and judgment appear intact. Alert and cooperative with normal attention span and concentration. No apparent delusions, illusions, hallucinations   Impression & Recommendations:  Problem # 1:  URI (ICD-465.9) Likely viral. Use her inhaler as needed. Call if worsens or if not better next week.  F/U in 2 weeks for nurse BP check since BP high today. May be effects from cold medications.   Complete Medication List: 1)  Alprazolam 0.5 Mg Tabs (Alprazolam) .... Take 1 tablet by mouth once a day as needed anxiety 2)  Proair Hfa 108 (90 Base) Mcg/act Aers (Albuterol sulfate) .... 2-4 puffs inhaled two times a day as needed 3)  Pantoprazole Sodium 40 Mg Tbec (Pantoprazole sodium) .... Take 1 tablet by mouth once a day 4)  Effexor Xr 150 Mg Xr24h-cap (Venlafaxine hcl) .... Take 1 tablet by mouth once a day  Patient Instructions: 1)  Call me on Monday if you are not better.

## 2010-02-28 NOTE — Progress Notes (Signed)
Summary: not to be released from jury duty  Phone Note Call from Patient   Caller: Patient Summary of Call: [phone]  patient said she has [employer] Duty and she said due to her meds she is taking she feel she shouldnt do it...patient needs a note Initial call taken by: Vanessa Swaziland,  July 04, 2009 8:13 AM  Follow-up for Phone Call        I don't have a reason to write her out.   Follow-up by: Seymour Bars DO,  July 04, 2009 8:16 AM     Appended Document: not to be released from jury duty Pt notified of MD instructions. kJ LPN

## 2010-02-28 NOTE — Letter (Signed)
Summary: Generic Letter  [facility]  [address], [address]   [place], [place] [address]   Phone: [phone]  Fax: [phone]    09/09/2009  [facility] KNIERI[facility] [address] [place], [place]  [address]  To Whom it May Concern;  Brooke Kaiser is able to return to work on 09/12/2009 to full duty. Please feel free to call if have any additional questions.           Sincerely,   Nani Gasser, MD

## 2010-02-28 NOTE — Letter (Signed)
Summary: Out of Work  [facility]  [address]   [place], [place] [address]   Phone: [phone]  Fax: [phone]    October 21, 2009   Employee:  Brooke Kaiser    To Whom It May Concern:   For Medical reasons, please excuse the above named employee from work for the following dates:  Start:   10-20-2009  End:   10-22-2009  If you need additional information, please feel free to contact our office.         Sincerely,    Nani Gasser MD

## 2010-03-02 NOTE — Assessment & Plan Note (Signed)
Summary: Sinusitis/Bronchitis   Vital Signs:  Patient profile:   40 year old female Height:      68 inches Weight:      225 pounds Pulse rate:   84 / minute BP sitting:   140 / 88  (right arm) Cuff size:   regular  Vitals Entered By: Avon Gully CMA, Duncan Dull) (January 17, 2010 2:54 PM)  Serial Vital Signs/Assessments:  Comments: 2:56 PM post med peak flows  [phone] pt is in the green zone By: Avon Gully CMA, (AAMA)   CC: cough x 2 days, pain under left breast from coughing?,pain in nech that radiated to back of head when bending down   Primary Care Provider:  Nani Gasser MD  CC:  cough x 2 days, pain under left breast from coughing?, and pain in nech that radiated to back of head when bending down.  History of Present Illness: cough x several  days, pain under left breast from coughing?,pain in neck that radiated to back of head when bending down. Started with some diarrhea and nausea about 10 days ago and then about a week ago started having nasal congestion and now with cough and congestion. Cough for about 4-5 days. Thinks had a fever initially. Taking theraflu and Airborne and her inhaler.  Some urine laking with cough. Coug is sometime dry and somtimes wet.  Nasal d/c is green/yellow. Still smoking. Constant pain on the left lower ribs, anterior. Not worse with deep breaths. Notices it hte last 2 days. Using inhaler three times a day   Current Medications (verified): 1)  Alprazolam 0.5 Mg Tabs (Alprazolam) .... Take 1 Tablet By Mouth Once A Day As Needed Anxiety 2)  Proair Hfa 108 (90 Base) Mcg/act  Aers (Albuterol Sulfate) .... 2-4 Puffs Inhaled Two Times A Day As Needed 3)  Pantoprazole Sodium 40 Mg Tbec (Pantoprazole Sodium) .... Take 1 Tablet By Mouth Once A Day 4)  Effexor Xr 150 Mg Xr24h-Cap (Venlafaxine Hcl) .... Take 1 Tablet By Mouth Once A Day  Allergies (verified): 1)  ! Codeine 2)  ! Augmentin  Comments:  Nurse/Medical Assistant: The  patient's medications and allergies were reviewed with the patient and were updated in the Medication and Allergy Lists. 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  Lives with her sister and her 4 kids parttime.   Current Smoker Alcohol use-yes Drug use-yes Regular exercise-no  Physical Exam  General:  Well-developed,well-nourished,in no acute distress; alert,appropriate and cooperative throughout examination Head:  Normocephalic and atraumatic without obvious abnormalities. No apparent alopecia or balding. Eyes:  No corneal or conjunctival inflammation noted. EOMI. Perrla.  Ears:  External ear exam shows no significant lesions or deformities.  Otoscopic examination reveals clear canals, tympanic membranes are intact bilaterally without bulging, retraction, inflammation or discharge. Hearing is grossly normal bilaterally. Nose:  External nasal examination shows no deformity or inflammation.  Mouth:  Oral mucosa and oropharynx without lesions or exudates.  Teeth in good repair. Neck:  No deformities, masses, or tenderness noted. Lungs:  Coarse BS bilat. Some expiratory wheeze at the left lung base.  Heart:  Normal rate and regular rhythm. S1 and S2 normal without gallop, murmur, click, rub or other extra sounds. Skin:  no rashes.   Cervical Nodes:  No lymphadenopathy noted Psych:  Cognition and judgment appear intact. Alert and cooperative with normal attention span and concentration. No apparent delusions, illusions, hallucinations   Impression & Recommendations:  Problem # 1:  SINUSITIS - ACUTE-NOS (ICD-461.9)  Her updated medication list for this problem includes:    Doxycycline Hyclate 100 Mg Tabs  (Doxycycline hyclate) .Marland Kitchen... Take 1 tablet by mouth two times a day for 10 days  Instructed on treatment. Call if symptoms persist or worsen.  Make sure usign inhaler as needed. Added an inhaled steroid for better control. Call if cough is keeping her up at night.   Problem # 2:  ACUTE BRONCHITIS (ICD-466.0)  Instructed on treatment. Call if symptoms persist or worsen.  Make sure usign inhaler as needed. Added an inhaled steroid for better control. Call if cough is keeping her up at night.  Her updated medication list for this problem includes:    Proair Hfa 108 (90 Base) Mcg/act Aers (Albuterol sulfate) .Marland Kitchen... 2-4 puffs inhaled two times a day as needed    Doxycycline Hyclate 100 Mg Tabs (Doxycycline hyclate) .Marland Kitchen... Take 1 tablet by mouth two times a day for 10 days    Qvar 40 Mcg/act Aers (Beclomethasone dipropionate) .Marland Kitchen... 2 puffs inhaled two times a day  Orders: Peak Flow Rate (94150)  Complete Medication List: 1)  Alprazolam 0.5 Mg Tabs (Alprazolam) .... Take 1 tablet by mouth once a day as needed anxiety 2)  Proair Hfa 108 (90 Base) Mcg/act Aers (Albuterol sulfate) .... 2-4 puffs inhaled two times a day as needed 3)  Pantoprazole Sodium 40 Mg Tbec (Pantoprazole sodium) .... Take 1 tablet by mouth once a day 4)  Effexor Xr 150 Mg Xr24h-cap (Venlafaxine hcl) .... Take 1 tablet by mouth once a day 5)  Doxycycline Hyclate 100 Mg Tabs (Doxycycline hyclate) .... Take 1 tablet by mouth two times a day for 10 days 6)  Qvar 40 Mcg/act Aers (Beclomethasone dipropionate) .... 2 puffs inhaled two times a day  Patient Instructions: 1)  Complete the antibiotic.  2)  If not better in one week then call us.  Prescriptions: QVAR 40 MCG/ACT AERS (BECLOMETHASONE DIPROPIONATE) 2 puffs inhaled two times a day  #1 x 1   Entered and Authorized by:   Nani Gasser MD   Signed by:   Nani Gasser MD on 01/17/2010   Method used:   Electronically to        [facility] [address] (retail)        [address]       [place], [place]  [address]       Ph: [phone] or [phone]       Fax: [phone]   RxID:   [id number] DOXYCYCLINE HYCLATE 100 MG TABS (DOXYCYCLINE HYCLATE) Take 1 tablet by mouth two times a day for 10 days  #20 x 0   Entered and Authorized by:   Nani Gasser MD   Signed by:   Nani Gasser MD on 01/17/2010   Method used:   [facility]ronically to        [facility]  [place] [address] (retail)       [address]       [place], [place]  [address]       Ph: [phone] or [phone]       Fax: [phone]   RxID:   [id number]    Orders Added: 1)  Est. Patient Level IV [  [address]214] 2)  Peak Flow Rate [94150]

## 2010-03-02 NOTE — Letter (Signed)
Summary: OUT OF WORK NOTE  OUT OF WORK NOTE   Imported By: Shelbie Proctor 02/17/2010 14:20:47  _____________________________________________________________________  External Attachment:    Type:   Image     Comment:   External Document

## 2010-03-02 NOTE — Assessment & Plan Note (Signed)
Summary: BAD COUGH/TJ room 4   Vital Signs:  Patient Profile:   40 Years Old Female CC:      Cough Height:     68 inches Weight:      228.25 pounds O2 Sat:      97 % O2 treatment:    Room Air Temp:     98.5 degrees F oral Pulse rate:   80 / minute Resp:     16 per minute BP sitting:   124 / 83  (left arm) Cuff size:   large                  Updated Prior Medication List: ALPRAZOLAM 0.5 MG TABS (ALPRAZOLAM) Take 1 tablet by mouth once a day as needed anxiety PROAIR HFA 108 (90 BASE) MCG/ACT  AERS (ALBUTEROL SULFATE) 2-4 puffs inhaled two times a day as needed PANTOPRAZOLE SODIUM 40 MG TBEC (PANTOPRAZOLE SODIUM) Take 1 tablet by mouth once a day EFFEXOR XR 150 MG XR24H-CAP (VENLAFAXINE HCL) Take 1 tablet by mouth once a day QVAR 40 MCG/ACT AERS (BECLOMETHASONE DIPROPIONATE) 2 puffs inhaled two times a day  Current Allergies (reviewed today): ! CODEINE ! AUGMENTINHistory of Present Illness Chief Complaint: Cough History of Present Illness: Pt complains 2 mo. of cough and congestion. Saw Dr Joneen Caraway 2 wks ago, rx'd doxy and steroid inhaler, which helped some. Pt not using albuterol inh recently.---C/O worsening cough, productive of yellow-green sputum for 2 days. She continues to smoke. No sore throat. No dyspnea. No chest pain.  +wheezing.  No nausea No vomiting. No fever, No chills   REVIEW OF SYSTEMS Constitutional Symptoms      Denies fever, chills, night sweats, weight loss, weight gain, and fatigue.  Eyes       Denies change in vision, eye pain, eye discharge, glasses, contact lenses, and eye surgery. Ear/Nose/Throat/Mouth       Complains of hoarseness.      Denies hearing loss/aids, change in hearing, ear pain, ear discharge, dizziness, frequent runny nose, frequent nose bleeds, sinus problems, sore throat, and tooth pain or bleeding.  Respiratory       Complains of productive cough, wheezing, and shortness of breath.      Denies dry cough, asthma, bronchitis,  and emphysema/COPD.  Cardiovascular       Denies murmurs, chest pain, and tires easily with exhertion.    Gastrointestinal       Complains of diarrhea.      Denies stomach pain, nausea/vomiting, constipation, blood in bowel movements, and indigestion. Genitourniary       Denies painful urination, kidney stones, and loss of urinary control. Neurological       Denies paralysis, seizures, and fainting/blackouts. Musculoskeletal       Denies muscle pain, joint pain, joint stiffness, decreased range of motion, redness, swelling, muscle weakness, and gout.  Skin       Denies bruising, unusual mles/lumps or sores, and hair/skin or nail changes.  Psych       Denies mood changes, temper/anger issues, anxiety/stress, speech problems, depression, and sleep problems. Other Comments: pt c/o she had bronchitis x 2wks ago and saw dr Linford Arnold and was prescribed Doxy. she states that she still has the productive cough( green/yellow/brown phlegm).   Past History:  Past Medical History: Reviewed history from 06/10/2009 and no changes required. ABnormal thyroid test  Past Surgical History: Reviewed history from 07/22/2007 and no changes required. Pt had a benign tumor removed from her Pleural sac on the left  lung.   Aug 2005  Family History: Reviewed history from 04/20/2009 and no changes required. Father with alcoholism Mother with Breast Cancer at 69 (premenopausal), colon polyps Father with depression GM wiht thyroid d/o M Uncle with cancerous colon polyps.  GM with heart dz.   Social History: Reviewed history from 05/07/2007 and no changes required. 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  Lives with her sister and her 4 kids parttime.   Current Smoker Alcohol use-yes Drug use-yes Regular exercise-no Physical Exam General appearance: well developed, well nourished, no acute distress Head: normocephalic, atraumatic Eyes: conjunctivae and lids  normal Ears: normal, no lesions or deformities Nasal: mucosa pink, nonedematous, no septal deviation, turbinates normal Oral/Pharynx: tongue normal, posterior pharynx without erythema or exudate Neck: neck supple,  trachea midline, no masses Chest/Lungs: scattered rhonchi, mild late exp wheezes. No rales. BS's = bilat. Heart: regular rate and  rhythm, no murmur Extremities: normal extremities Assessment New Problems: BRONCHITIS, ACUTE WITH BRONCHOSPASM (ICD-466.0)   Patient Education: Patient and/or caregiver instructed in the following: rest, fluids, quit smoking. Demonstrates willingness to comply.  Plan New Medications/Changes: ZITHROMAX Z-PAK 250 MG TABS (AZITHROMYCIN)  #1 x 0, 02/16/2010, Lajean Manes MD PREDNISONE (PAK) 10 MG TABS (PREDNISONE)  #1 x 0, 02/16/2010, Lajean Manes MD  New Orders: Est. Patient Level IV [20254] Planning Comments:   continue steroid inh. Use albuterol as rescue inhaler as needed. Pt declined any INH Tx here at this time.Risks, benefits, alternatives discussed. Pt voiced understanding and agreement.  Follow Up: Follow up in 2-3 days if no improvement, Follow up with Primary Physician Follow Up: 7-10 days  The patient and/or caregiver has been counseled thoroughly with regard to medications prescribed including dosage, schedule, interactions, rationale for use, and possible side effects and they verbalize understanding.  Diagnoses and expected course of recovery discussed and will return if not improved as expected or if the condition worsens. Patient and/or caregiver verbalized understanding.  Prescriptions: ZITHROMAX Z-PAK 250 MG TABS (AZITHROMYCIN)   #1 x 0   Entered and Authorized by:   Lajean Manes MD   Signed by:   Lajean Manes MD on 02/16/2010   Method used:   Handwritten   RxID:   [id number] PREDNISONE (PAK) 10 MG TABS (PREDNISONE)   #1 x 0   Entered and Authorized by:   Lajean Manes MD   Signed by:   Lajean Manes MD on 02/16/2010    Method used:   Handwritten   RxID:   [id number]   Orders Added: 1)  Est. Patient Level IV [62703]

## 2010-03-02 NOTE — Letter (Signed)
Summary: Out of Work  [facility]  [address]   [place], [place] [address]   Phone: [phone]  Fax: [phone]    January 17, 2010   Employee:  JAYLEAN BUENAVENTURA    To Whom It May Concern:   For Medical reasons, please excuse the above named employee from work for the following dates:  Start:   01-17-2010  End:   01-19-2010  If you need additional information, please feel free to contact our office.         Sincerely,    Nani Gasser MD

## 2010-03-03 ENCOUNTER — Telehealth: Payer: Self-pay | Admitting: Family Medicine

## 2010-03-04 ENCOUNTER — Ambulatory Visit (INDEPENDENT_AMBULATORY_CARE_PROVIDER_SITE_OTHER): Payer: 59 | Admitting: Family Medicine

## 2010-03-04 ENCOUNTER — Encounter: Payer: Self-pay | Admitting: Family Medicine

## 2010-03-04 DIAGNOSIS — K0401 Reversible pulpitis: Secondary | ICD-10-CM

## 2010-03-06 ENCOUNTER — Telehealth: Payer: Self-pay | Admitting: Family Medicine

## 2010-03-08 NOTE — Progress Notes (Signed)
Summary: [facility]-med reaction  Phone Note Call from Patient Call back at Home Phone [phone]   Caller: Patient Reason for Call: Acute Illness Summary of Call: pt began taking Lamictal last week-was given by psychiatrist--and is now having face swelling.  It is one-sided and swollen to the point that her eye is almost shut.  She denies any difficulty breathing.  She also states her gums feel raw.  Pt last took med at 11 pm last night.   Pt would like to know if OK to take benadryl.  Pt has not contacted psychiatrist.  Encouraged pt to do so as we are not prescribing MD.  Can pt stop this med or does she need special instruction from psych? Initial call taken by: Francee Piccolo CMA Duncan Dull),  March 03, 2010 10:04 AM  Follow-up for Phone Call        I would say STOP the Lamictal and contact the psychiatrist today. Follow-up by: Seymour Bars DO,  March 03, 2010 10:16 AM     Appended Document: [facility]-med reaction notified to STOP lamictal and call psychiatrist for further advise and instructions via voicemail per pt request.

## 2010-03-08 NOTE — Assessment & Plan Note (Signed)
Summary: FACE IS SWOLLEN ON RT SIDE Room 4   Vital Signs:  Patient Profile:   40 Years Old Female CC:      Rt side of face swollen, feels a knot  x 4 days, gums are raw Height:     68 inches Weight:      225 pounds O2 Sat:      98 % O2 treatment:    Room Air Temp:     99.0 degrees F oral Pulse rate:   92 / minute Pulse rhythm:   regular Resp:     16 per minute BP sitting:   130 / 82  (left arm) Cuff size:   large  Vitals Entered By: Emilio Math (March 04, 2010 10:03 AM)              Vision Screening: Left eye w/o correction: 20 / 25 Right Eye w/o correction: 20 / 25 Both eyes w/o correction:  20/ 25        Vision Entered By: Emilio Math (March 04, 2010 10:09 AM)    Current Allergies (reviewed today): ! CODEINE ! AUGMENTINHistory of Present Illness Chief Complaint: Rt side of face swollen, feels a knot  x 4 days, gums are raw History of Present Illness:  Subjective:  Patient complains of 4 day history of gradual swelling in her right cheek without pain.  Over the past 2 days she has developed soreness in her right upper gum without toothache.  No fevers, chills, and sweats.  No sinus congestion.  No sore throat.  She has not visited her dentist in about a year.  Current Meds ALPRAZOLAM 0.5 MG TABS (ALPRAZOLAM) Take 1 tablet by mouth once a day as needed anxiety PROAIR HFA 108 (90 BASE) MCG/ACT  AERS (ALBUTEROL SULFATE) 2-4 puffs inhaled two times a day as needed PANTOPRAZOLE SODIUM 40 MG TBEC (PANTOPRAZOLE SODIUM) Take 1 tablet by mouth once a day EFFEXOR XR 150 MG XR24H-CAP (VENLAFAXINE HCL) Take 1 tablet by mouth once a day QVAR 40 MCG/ACT AERS (BECLOMETHASONE DIPROPIONATE) 2 puffs inhaled two times a day LAMICTAL 100 MG TABS (LAMOTRIGINE)  PENICILLIN V POTASSIUM 500 MG TABS (PENICILLIN V POTASSIUM) 1 by mouth q6hr  REVIEW OF SYSTEMS Constitutional Symptoms      Denies fever, chills, night sweats, weight loss, weight gain, and fatigue.  Eyes   Complains of eye pain.      Denies change in vision, eye discharge, glasses, contact lenses, and eye surgery. Ear/Nose/Throat/Mouth       Complains of sinus problems.      Denies hearing loss/aids, change in hearing, ear pain, ear discharge, dizziness, frequent runny nose, frequent nose bleeds, sore throat, hoarseness, and tooth pain or bleeding.  Respiratory       Denies dry cough, productive cough, wheezing, shortness of breath, asthma, bronchitis, and emphysema/COPD.  Cardiovascular       Denies murmurs, chest pain, and tires easily with exhertion.    Gastrointestinal       Denies stomach pain, nausea/vomiting, diarrhea, constipation, blood in bowel movements, and indigestion. Genitourniary       Denies painful urination, kidney stones, and loss of urinary control. Neurological       Denies paralysis, seizures, and fainting/blackouts. Musculoskeletal       Denies muscle pain, joint pain, joint stiffness, decreased range of motion, redness, swelling, muscle weakness, and gout.  Skin       Complains of unusual moles/lumps or sores.      Denies  bruising and hair/skin or nail changes.  Psych       Denies mood changes, temper/anger issues, anxiety/stress, speech problems, depression, and sleep problems.  Past History:  Past Medical History: Reviewed history from 06/10/2009 and no changes required. ABnormal thyroid test  Past Surgical History: Reviewed history from 07/22/2007 and no changes required. 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   Aug 2005  Family History: Reviewed history from 04/20/2009 and no changes required. Father with alcoholism Mother with Breast Cancer at 15 (premenopausal), colon polyps Father with depression GM wiht thyroid d/o M Uncle with cancerous colon polyps.  GM with heart dz.   Social History: Reviewed history from 05/07/2007 and no changes required. 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  Lives with her sister and her 4 kids parttime.   Current Smoker Alcohol use-yes Drug use-yes Regular exercise-no   Objective:  No acute distress  Eyes:  Pupils are equal, round, and reactive to light and accomdation.  Extraocular movement is intact.  Conjunctivae are not inflamed.  Ears:  Canals normal.  Tympanic membranes normal.   Nose:  No sinus congestion.  ? right maxillary sinus tenderness Mouth:  Caries present.  Right upper gingiva tender but not swollen; not friable.  Tooth #5 in right upper jaw is partly fractured Pharynx:  Normal  Face: Mild swelling right cheek but no soft tissue tenderness, warmth, or fluctuance. Neck:  Supple.  No adenopathy is present.   Lungs:  Clear to auscultation.  Breath sounds are equal.  Heart:  Regular rate and rhythm without murmurs, rubs, or gallops.  Assessment New Problems: PULPITIS (ICD-522.0)   Plan New Medications/Changes: PENICILLIN V POTASSIUM 500 MG TABS (PENICILLIN V POTASSIUM) 1 by mouth q6hr  #40 x 0, 03/04/2010, Donna Christen MD  New Orders: Est. Patient Level III [phone] Planning Comments:   Begin penicillin.  Continue Ibuprofen 200mg , 4 tabs every 8 hours with food  Follow-up with dentist as soon as possible.  Return for increased pain, swelling, fever, etc.   The patient and/or caregiver has been counseled thoroughly with regard to medications prescribed including dosage, schedule, interactions, rationale for use, and possible side effects and they verbalize understanding.  Diagnoses and expected course of recovery discussed and will return if not improved as expected or if the condition worsens. Patient and/or caregiver verbalized understanding.  Prescriptions: PENICILLIN V POTASSIUM 500 MG TABS (PENICILLIN V POTASSIUM) 1 by mouth q6hr  #40 x 0   Entered and Authorized by:   Donna Christen MD   Signed by:   Donna Christen MD on 03/04/2010   Method used:   Print then Give to Patient   RxID:    [id number]   Orders Added: 1)  Est. Patient Level III [86578]

## 2010-03-16 NOTE — Progress Notes (Signed)
Summary: Infected tooth  Phone Note Call from Patient Call back at Home Phone [phone] [place]     Caller: Patient Summary of Call: Pt wanted to let Dr Linford Arnold know that her face was swollen due to an infected tooth. She got antibiotic from Urgent Care & she will follow up with her dentist Initial call taken by: Lannette Donath,  March 06, 2010 1:25 PM    Callback from Urgent Care: No answer. Message left with reason for followup call. Reminded patient to visit dentist ASAP. Lajean Saver RN  March 06, 2010 1:37 PM

## 2010-03-24 ENCOUNTER — Encounter (INDEPENDENT_AMBULATORY_CARE_PROVIDER_SITE_OTHER): Payer: 59 | Admitting: Psychiatry

## 2010-03-24 DIAGNOSIS — F411 Generalized anxiety disorder: Secondary | ICD-10-CM

## 2010-03-24 DIAGNOSIS — F339 Major depressive disorder, recurrent, unspecified: Secondary | ICD-10-CM

## 2010-03-28 ENCOUNTER — Encounter (INDEPENDENT_AMBULATORY_CARE_PROVIDER_SITE_OTHER): Payer: 59 | Admitting: Psychology

## 2010-03-28 ENCOUNTER — Ambulatory Visit (INDEPENDENT_AMBULATORY_CARE_PROVIDER_SITE_OTHER): Payer: 59 | Admitting: Family Medicine

## 2010-03-28 ENCOUNTER — Encounter: Payer: Self-pay | Admitting: Family Medicine

## 2010-03-28 DIAGNOSIS — R5383 Other fatigue: Secondary | ICD-10-CM

## 2010-03-28 DIAGNOSIS — R5381 Other malaise: Secondary | ICD-10-CM

## 2010-03-28 DIAGNOSIS — L821 Other seborrheic keratosis: Secondary | ICD-10-CM

## 2010-03-28 DIAGNOSIS — F319 Bipolar disorder, unspecified: Secondary | ICD-10-CM

## 2010-03-28 LAB — CONVERTED CEMR LAB: Heterophile Ab Screen: NEGATIVE

## 2010-03-29 ENCOUNTER — Encounter: Payer: Self-pay | Admitting: Family Medicine

## 2010-03-29 DIAGNOSIS — D649 Anemia, unspecified: Secondary | ICD-10-CM | POA: Insufficient documentation

## 2010-03-29 LAB — CONVERTED CEMR LAB
Basophils Absolute: 0 10*3/uL (ref 0.0–0.1)
Hemoglobin: 11 g/dL — ABNORMAL LOW (ref 12.0–15.0)
Lymphocytes Relative: 27 % (ref 12–46)
Monocytes Absolute: 1.3 10*3/uL — ABNORMAL HIGH (ref 0.1–1.0)
Neutro Abs: 6.3 10*3/uL (ref 1.7–7.7)
RBC: 4.27 M/uL (ref 3.87–5.11)
RDW: 16.9 % — ABNORMAL HIGH (ref 11.5–15.5)
TSH: 0.973 microintl units/mL (ref 0.350–4.500)
WBC: 10.9 10*3/uL — ABNORMAL HIGH (ref 4.0–10.5)

## 2010-03-30 ENCOUNTER — Encounter: Payer: Self-pay | Admitting: Family Medicine

## 2010-03-31 LAB — CONVERTED CEMR LAB
Folate: 10.6 ng/mL
TIBC: 443 ug/dL (ref 250–470)
UIBC: 375 ug/dL

## 2010-04-06 NOTE — Assessment & Plan Note (Signed)
Summary: fatigue, skin, BP   Vital Signs:  Patient profile:   40 year old female Height:      68 inches Weight:      238 pounds Pulse rate:   85 / minute BP sitting:   142 / 88  (right arm) Cuff size:   large  Vitals Entered By: Avon Gully CMA, Duncan Dull) (March 28, 2010 2:29 PM) CC: fatigue x 2 weeks,roomate has mono,check mole on rt cheek   Primary Care Provider:  Nani Gasser MD  CC:  fatigue x 2 weeks, roomate has mono, and check mole on rt cheek.  History of Present Illness: fatigue x 2 weeks,roomate has mono,check mole on rt cheek.  Mild ST initally but veyr mild. Has soma inital sinus drainge but minor as well.  Has felt realy run down and just slept the whole weekend and says this is not like her. Has felt really groggy.  No rash.  No sweats or chills.  No GI sxs.    Mole one right side of left face and says noticed it about 1.5 years ago. has been getting larger. pain or irritation.  Current Medications (verified): 1)  Alprazolam 0.5 Mg Tabs (Alprazolam) .... Take 1 Tablet By Mouth Once A Day As Needed Anxiety 2)  Proair Hfa 108 (90 Base) Mcg/act  Aers (Albuterol Sulfate) .... 2-4 Puffs Inhaled Two Times A Day As Needed 3)  Pantoprazole Sodium 40 Mg Tbec (Pantoprazole Sodium) .... Take 1 Tablet By Mouth Once A Day 4)  Effexor Xr 150 Mg Xr24h-Cap (Venlafaxine Hcl) .... Take 1 Tablet By Mouth Once A Day 5)  Qvar 40 Mcg/act Aers (Beclomethasone Dipropionate) .... 2 Puffs Inhaled Two Times A Day 6)  Lamictal 100 Mg Tabs (Lamotrigine)  Allergies (verified): 1)  ! Codeine 2)  ! Augmentin  Comments:  Nurse/Medical Assistant: The patient's medications and allergies were reviewed with the patient and were updated in the Medication and Allergy Lists. Avon Gully CMA, Duncan Dull) (March 28, 2010 2:31 PM)  Physical Exam  General:  Well-developed,well-nourished,in no acute distress; alert,appropriate and cooperative throughout examination Head:  Normocephalic  and atraumatic without obvious abnormalities. No apparent alopecia or balding. Eyes:  No corneal or conjunctival inflammation noted. EOMI. Perrla. Ears:  External ear exam shows no significant lesions or deformities.  Otoscopic examination reveals clear canals, tympanic membranes are intact bilaterally without bulging, retraction, inflammation or discharge. Hearing is grossly normal bilaterally. Nose:  External nasal examination shows no deformity or inflammation.  Mouth:  Oral mucosa and oropharynx without lesions or exudates.  Teeth in good repair. Neck:  No deformities, masses, or tenderness noted. Lungs:  Normal respiratory effort, chest expands symmetrically. Lungs are clear to auscultation, no crackles or wheezes. Heart:  Normal rate and regular rhythm. S1 and S2 normal without gallop, murmur, click, rub or other extra sounds. Skin:  left side of facial cheeck with a seborrheic keraotis with wart-like features. No bleeding or crusting.  Cervical Nodes:  No lymphadenopathy noted Psych:  Cognition and judgment appear intact. Alert and cooperative with normal attention span and concentration. No apparent delusions, illusions, hallucinations   Impression & Recommendations:  Problem # 1:  FATIGUE (ICD-780.79)  Will check for mono since has a + exposure. No sign ST or rash. No LN on exam.  the rapid mono test was negative.  An old check a CBC and a TSH.  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 Recommend cryotherapy. patient tolerated procedure well.  If recurs and consider biopsy to rule out squamous cell..  Orders: Cryotheraphy (1st lesion) (17000)  Problem # 3:  HYPERTENSION, BENIGN (ICD-401.1)  BP is up likely secondary to weight gain. Will start ACE. Dicussed potential SE. F/U for rechekc in one month. If gets weight and diet back under control may be able  to discontinue the medicaiton.   Her updated medication list for this problem includes:    Lisinopril 10 Mg Tabs (Lisinopril) .Marland Kitchen... Take 1 tablet by mouth once a day  Complete Medication List: 1)  Alprazolam 0.5 Mg Tabs (Alprazolam) .... Take 1 tablet by mouth once a day as needed anxiety 2)  Proair Hfa 108 (90 Base) Mcg/act Aers (Albuterol sulfate) .... 2-4 puffs inhaled two times a day as needed 3)  Pantoprazole Sodium 40 Mg Tbec (Pantoprazole sodium) .... Take 1 tablet by mouth once a day 4)  Effexor Xr 150 Mg Xr24h-cap (Venlafaxine hcl) .... Take 1 tablet by mouth once a day 5)  Qvar 40 Mcg/act Aers (Beclomethasone dipropionate) .... 2 puffs inhaled two times a day 6)  Lamictal 100 Mg Tabs (Lamotrigine) 7)  Lisinopril 10 Mg Tabs (Lisinopril) .... Take 1 tablet by mouth once a day  Patient Instructions: 1)  Will start lisinorpil once a day for blood pressure. Take in the AM and then follow up with me in one month to recheck. IN the meantime work on exercise and diet and weight loss.  If BP and weight is coming down we might be able to stop the medication.  Prescriptions: LISINOPRIL 10 MG TABS (LISINOPRIL) Take 1 tablet by mouth once a day  #30 x 1   Entered and Authorized by:   Nani Gasser MD   Signed by:   Nani Gasser MD on 03/28/2010   Method used:   Electronically to        [facility]  [place] [address] (retail)       [address] [place] [address]       [place], [place]  [address]       Ph: [phone] or [phone]       Fax: [phone]   RxID:   [id number]    Orders Added: 1)  Fingerstick [36416] 2)  Monospot [89381] 3)  T-CBC w/Diff [[id number]] 4)  T-TSH [[id number]] 5)  Est. Patient Level III [61443] 6)  Cryotheraphy (1st lesion) [17000]     Procedure Note Last Tetanus: Tdap (12/09/2008)  Mole Biopsy/Removal: Onset of lesion: 1.5 years ago Indication: changing lesion  Procedure # 1: cryotherapy    Size (in cm): 0.3 x 0.5    Region: let facial  cheek  Laboratory Results   Blood Tests   Date/Time Received: 03/28/10 Date/Time Reported: 03/28/10   Mono: negative

## 2010-04-07 ENCOUNTER — Ambulatory Visit (INDEPENDENT_AMBULATORY_CARE_PROVIDER_SITE_OTHER): Payer: 59 | Admitting: Family Medicine

## 2010-04-07 ENCOUNTER — Encounter: Payer: Self-pay | Admitting: Family Medicine

## 2010-04-07 ENCOUNTER — Ambulatory Visit (INDEPENDENT_AMBULATORY_CARE_PROVIDER_SITE_OTHER): Payer: 59 | Admitting: Emergency Medicine

## 2010-04-07 ENCOUNTER — Encounter: Payer: Self-pay | Admitting: Emergency Medicine

## 2010-04-07 DIAGNOSIS — L03119 Cellulitis of unspecified part of limb: Secondary | ICD-10-CM | POA: Insufficient documentation

## 2010-04-07 DIAGNOSIS — L02419 Cutaneous abscess of limb, unspecified: Secondary | ICD-10-CM

## 2010-04-07 DIAGNOSIS — I10 Essential (primary) hypertension: Secondary | ICD-10-CM | POA: Insufficient documentation

## 2010-04-08 ENCOUNTER — Encounter: Payer: Self-pay | Admitting: Emergency Medicine

## 2010-04-10 ENCOUNTER — Telehealth (INDEPENDENT_AMBULATORY_CARE_PROVIDER_SITE_OTHER): Payer: Self-pay | Admitting: *Deleted

## 2010-04-11 ENCOUNTER — Encounter (INDEPENDENT_AMBULATORY_CARE_PROVIDER_SITE_OTHER): Payer: 59 | Admitting: Psychology

## 2010-04-11 DIAGNOSIS — F319 Bipolar disorder, unspecified: Secondary | ICD-10-CM

## 2010-04-11 NOTE — Assessment & Plan Note (Signed)
Summary: SKIN RASH/WSE (rm 3)   Vital Signs:  Patient Profile:   40 Years Old Female CC:      sores on left lower leg x 4-5days Height:     68 inches Weight:      237 pounds O2 Sat:      99 % O2 treatment:    Room Air Temp:     98.7 degrees F oral Pulse rate:   87 / minute Resp:     18 per minute BP sitting:   128 / 77  (left arm) Cuff size:     large  Vitals Entered By: Lajean Saver RN (April 07, 2010 12:41 PM)                  Updated Prior Medication List: ALPRAZOLAM 0.5 MG TABS (ALPRAZOLAM) Take 1 tablet by mouth once a day as needed anxiety PROAIR HFA 108 (90 BASE) MCG/ACT  AERS (ALBUTEROL SULFATE) 2-4 puffs inhaled two times a day as needed PANTOPRAZOLE SODIUM 40 MG TBEC (PANTOPRAZOLE SODIUM) Take 1 tablet by mouth once a day EFFEXOR XR 150 MG XR24H-CAP (VENLAFAXINE HCL) Take 1 tablet by mouth once a day QVAR 40 MCG/ACT AERS (BECLOMETHASONE DIPROPIONATE) 2 puffs inhaled two times a day LAMICTAL 100 MG TABS (LAMOTRIGINE)  LISINOPRIL 10 MG TABS (LISINOPRIL) Take 1 tablet by mouth once a day  Current Allergies (reviewed today): ! CODEINE ! AUGMENTINHistory of Present Illness Chief Complaint: sores on left lower leg x 4-5days History of Present Illness: Sores on L lower leg for 4-5 days.  She doesn't recall any bites or trauma.  Red, slightly painful, a little swollen.  The nurse at her work looked at it and thought it was MRSA. No F/C/N/V.  No previous lesions.  No new soaps, shampoos, detergents, pets.  No other lesions on the rest of her body.  REVIEW OF SYSTEMS Constitutional Symptoms      Denies fever, chills, night sweats, weight loss, weight gain, and fatigue.  Eyes       Denies change in vision, eye pain, eye discharge, glasses, contact lenses, and eye surgery. Ear/Nose/Throat/Mouth       Denies hearing loss/aids, change in hearing, ear pain, ear discharge, dizziness, frequent runny nose, frequent nose bleeds, sinus problems, sore throat, hoarseness, and tooth  pain or bleeding.  Respiratory       Denies dry cough, productive cough, wheezing, shortness of breath, asthma, bronchitis, and emphysema/COPD.  Cardiovascular       Denies murmurs, chest pain, and tires easily with exhertion.    Gastrointestinal       Denies stomach pain, nausea/vomiting, diarrhea, constipation, blood in bowel movements, and indigestion. Genitourniary       Denies painful urination, blood or discharge from vagina, kidney stones, and loss of urinary control. Neurological       Denies paralysis, seizures, and fainting/blackouts. Musculoskeletal       Denies muscle pain, joint pain, joint stiffness, decreased range of motion, redness, swelling, muscle weakness, and gout.  Skin       Complains of unusual moles/lumps or sores.      Denies bruising and hair/skin or nail changes.      Comments: left lower leg Psych       Denies mood changes, temper/anger issues, anxiety/stress, speech problems, depression, and sleep problems. Other Comments: patient c/o sore to left lower leg x 4-5 days. Little drainage. Painful. No fever   Past History:  Past Medical History: ABnormal thyroid test Hypertension anemic  Past Surgical History: Reviewed history from 07/22/2007 and no changes required. 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   Aug 2005  Family History: Reviewed history from 04/20/2009 and no changes required. Father with alcoholism Mother with Breast Cancer at 70 (premenopausal), colon polyps Father with depression GM wiht thyroid d/o M Uncle with cancerous colon polyps.  GM with heart dz.   Social History: Reviewed history from 05/07/2007 and no changes required. 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  Lives with her sister and her 4 kids parttime.   Current Smoker Alcohol use-yes Drug use-yes Regular exercise-no Physical Exam General appearance: well developed, well nourished, no acute  distress MSE: oriented to time, place, and person Left anterior shin.  Two areas where is appears that she had been bitten by a bug or stuck with a pricker.  Mild surrounding erythema up to 1cm from the wound, larger inferiorly. Mild induration, no fluctuance.  No warmth, no drainage, no bleeding.  Slight tenderness. Assessment New Problems: CELLULITIS AND ABSCESS OF LEG EXCEPT FOOT (ICD-682.6) HYPERTENSION (ICD-401.9)   Patient Education: Patient and/or caregiver instructed in the following: Ibuprofen prn.  Plan New Medications/Changes: DOXYCYCLINE HYCLATE 100 MG CAPS (DOXYCYCLINE HYCLATE) 1 by mouth two times a day for 10 days  #20 x 0, 04/07/2010, Hoyt Koch MD  New Orders: Est. Patient Level IV [59563] T-Culture, Wound [[id number]] Planning Comments:   Superficial wound culture taken, but likely nothing will return since no drainage.  Appears to be small staph infection.  Will treat with Doxy.  Warm compresses and keep covered and clean. DDx includes erythema nodosum secondary to possible recent EBV infection.  However no systemic symptoms and not really nodular at this point.  If not improving with antibiotics, make an appt with your PCP to discuss further workup.   The patient and/or caregiver has been counseled thoroughly with regard to medications prescribed including dosage, schedule, interactions, rationale for use, and possible side effects and they verbalize understanding.  Diagnoses and expected course of recovery discussed and will return if not improved as expected or if the condition worsens. Patient and/or caregiver verbalized understanding.  Prescriptions: DOXYCYCLINE HYCLATE 100 MG CAPS (DOXYCYCLINE HYCLATE) 1 by mouth two times a day for 10 days  #20 x 0   Entered and Authorized by:   Hoyt Koch MD   Signed by:   Hoyt Koch MD on 04/07/2010   Method used:   Print then Give to Patient   RxID:   [id number]   Orders Added: 1)  Est.  Patient Level IV [60630] 2)  T-Culture, Wound [[id number]]

## 2010-04-11 NOTE — Letter (Signed)
Summary: Out of Work  [facility]  [address]   [place], [place] [address]   Phone: [phone]  Fax: [phone]    April 07, 2010   Employee:  ANUSHRI CASALINO    To Whom It May Concern:   If the above patient keeps the leg lesion covered, she may return to work on Monday. She will no longer be contagious.          Sincerely,    Hoyt Koch MD

## 2010-04-11 NOTE — Letter (Signed)
Summary: Out of Work  [facility]  [address]   [place], [place] [address]   Phone: [phone]  Fax: [phone]    April 07, 2010   Employee:  Brooke Kaiser    To Whom It May Concern:   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    If you need additional information, please feel free to contact our office.         Sincerely,    Nani Gasser MD

## 2010-04-11 NOTE — Assessment & Plan Note (Signed)
Summary: ?MRSA Lower left leg - jr   Vital Signs:  Patient profile:   40 year old female Height:      68 inches Weight:      238 pounds Pulse rate:   67 / minute BP sitting:   139 / 88  (right arm) Cuff size:   large  Vitals Entered By: Avon Gully CMA, Duncan Dull) (April 07, 2010 1:53 PM) CC: just seen at urgent care today possible MRSA, took a wound culture, wanted a second opinion   Primary Care Provider:  Nani Gasser MD  CC:  just seen at urgent care today possible MRSA, took a wound culture, and wanted a second opinion.  History of Present Illness:  patient has 4 erythematous papules on her left lower leg near her ankle that have been there for several days. In she had a nurse look at it at work he felt like it could be MRSA so she came to urgent care earlier today. she said she was told it could be stopped or MRSA and she was confused by this and wanted me to see her today. she says she has been covering the wound and has not noticed any drainage or pus. It is tender to touch. she has not had any prior history of MRSA or abscess ease.  Current Medications (verified): 1)  Alprazolam 0.5 Mg Tabs (Alprazolam) .... Take 1 Tablet By Mouth Once A Day As Needed Anxiety 2)  Proair Hfa 108 (90 Base) Mcg/act  Aers (Albuterol Sulfate) .... 2-4 Puffs Inhaled Two Times A Day As Needed 3)  Pantoprazole Sodium 40 Mg Tbec (Pantoprazole Sodium) .... Take 1 Tablet By Mouth Once A Day 4)  Effexor Xr 150 Mg Xr24h-Cap (Venlafaxine Hcl) .... Take 1 Tablet By Mouth Once A Day 5)  Qvar 40 Mcg/act Aers (Beclomethasone Dipropionate) .... 2 Puffs Inhaled Two Times A Day 6)  Lamictal 100 Mg Tabs (Lamotrigine) 7)  Lisinopril 10 Mg Tabs (Lisinopril) .... Take 1 Tablet By Mouth Once A Day 8)  Doxycycline Hyclate 100 Mg Caps (Doxycycline Hyclate) .Marland Kitchen.. 1 By Mouth Two Times A Day For 10 Days  Allergies (verified): 1)  ! Codeine 2)  ! Augmentin  Comments:  Nurse/Medical Assistant: The patient's  medications and allergies were reviewed with the patient and were updated in the Medication and Allergy Lists. Avon Gully CMA, Duncan Dull) (April 07, 2010 1:54 PM)  Physical Exam  General:  Well-developed,well-nourished,in no acute distress; alert,appropriate and cooperative throughout examination Skin:   left lower leg near her ankle with 4 erythematous papules. There is some edema in the area as well around her ankle. the lesions are tender to touch. No pustules.   Impression & Recommendations:  Problem # 1:  CELLULITIS AND ABSCESS OF LEG EXCEPT FOOT (ICD-682.6)  it is likely that she does have a infection with staph but it is unclear if it is MRSA at this point. I explained the difference to her. I let her know that she was covered for MRSA infection by taking the doxycycline. She is to keep the wound covered and when she's been on antibiotics for 48 hours she can return to work as long as the lesions continue to be covered. I did give her a note saying it is safe for her return to work on Monday. Hopefully the culture will be back by then and  if we need to adjust antibiotics we can. If the lesion suddenly get larger or more painful over the weekend she  is to seek emergency care. Her updated medication list for this problem includes:    Doxycycline Hyclate 100 Mg Caps (Doxycycline hyclate) .Marland Kitchen... 1 by mouth two times a day for 10 days  Complete Medication List: 1)  Alprazolam 0.5 Mg Tabs (Alprazolam) .... Take 1 tablet by mouth once a day as needed anxiety 2)  Proair Hfa 108 (90 Base) Mcg/act Aers (Albuterol sulfate) .... 2-4 puffs inhaled two times a day as needed 3)  Pantoprazole Sodium 40 Mg Tbec (Pantoprazole sodium) .... Take 1 tablet by mouth once a day 4)  Effexor Xr 150 Mg Xr24h-cap (Venlafaxine hcl) .... Take 1 tablet by mouth once a day 5)  Qvar 40 Mcg/act Aers (Beclomethasone dipropionate) .... 2 puffs inhaled two times a day 6)  Lamictal 100 Mg Tabs (Lamotrigine) 7)  Lisinopril  10 Mg Tabs (Lisinopril) .... Take 1 tablet by mouth once a day 8)  Doxycycline Hyclate 100 Mg Caps (Doxycycline hyclate) .Marland Kitchen.. 1 by mouth two times a day for 10 days   Orders Added: 1)  Est. Patient Level III [21308]

## 2010-04-11 NOTE — Letter (Signed)
Summary: Out of Work  [facility]  [address]   [place], [place] [address]   Phone: [phone]  Fax: [phone]    April 07, 2010   Employee:  Brooke Kaiser    To Whom It May Concern:   For Medical reasons, please excuse the above named employee from work for the following dates:  April 07, 2010           Sincerely,    Hoyt Koch MD

## 2010-04-18 NOTE — Progress Notes (Signed)
  Phone Note Outgoing Call Call back at [facility] Phone [phone]   Call placed by: Lajean Saver RN,  April 10, 2010 3:21 PM Call placed to: Patient Action Taken: Phone Call Completed Summary of Call: Callback: Patietn given wound culture results. She reports the sore on her leg is the same. I advised her to take 7 days of the antibiotic. If still unchanged or worse see PCP or dermatologist.

## 2010-04-24 ENCOUNTER — Other Ambulatory Visit: Payer: Self-pay | Admitting: Family Medicine

## 2010-04-24 MED ORDER — PANTOPRAZOLE SODIUM 40 MG PO TBEC: 40.0000 mg | DELAYED_RELEASE_TABLET | Freq: Every day | ORAL | Status: DC

## 2010-04-25 ENCOUNTER — Ambulatory Visit: Payer: 59 | Admitting: Family Medicine

## 2010-05-02 ENCOUNTER — Encounter (HOSPITAL_COMMUNITY): Payer: 59 | Admitting: Psychology

## 2010-05-04 ENCOUNTER — Encounter: Payer: Self-pay | Admitting: Family Medicine

## 2010-05-10 ENCOUNTER — Ambulatory Visit: Payer: 59 | Admitting: Family Medicine

## 2010-05-11 ENCOUNTER — Encounter (HOSPITAL_COMMUNITY): Payer: 59 | Admitting: Psychology

## 2010-05-16 ENCOUNTER — Other Ambulatory Visit: Payer: Self-pay | Admitting: Family Medicine

## 2010-05-16 ENCOUNTER — Encounter (HOSPITAL_COMMUNITY): Payer: 59 | Admitting: Psychology

## 2010-05-17 ENCOUNTER — Encounter (HOSPITAL_COMMUNITY): Payer: 59 | Admitting: Psychology

## 2010-05-23 ENCOUNTER — Ambulatory Visit (INDEPENDENT_AMBULATORY_CARE_PROVIDER_SITE_OTHER): Payer: 59 | Admitting: Family Medicine

## 2010-05-23 ENCOUNTER — Telehealth: Payer: Self-pay | Admitting: Family Medicine

## 2010-05-23 ENCOUNTER — Encounter (INDEPENDENT_AMBULATORY_CARE_PROVIDER_SITE_OTHER): Payer: 59 | Admitting: Psychiatry

## 2010-05-23 ENCOUNTER — Encounter: Payer: Self-pay | Admitting: Family Medicine

## 2010-05-23 ENCOUNTER — Ambulatory Visit
Admission: RE | Admit: 2010-05-23 | Discharge: 2010-05-23 | Disposition: A | Discharge status: Home / Self Care | Payer: 59 | Source: Ambulatory Visit | Attending: Family Medicine | Admitting: Family Medicine

## 2010-05-23 VITALS — BP 123/86 | HR 85 | Temp 98.6°F | Ht 68.0 in | Wt 237.0 lb

## 2010-05-23 DIAGNOSIS — F339 Major depressive disorder, recurrent, unspecified: Secondary | ICD-10-CM

## 2010-05-23 DIAGNOSIS — F411 Generalized anxiety disorder: Secondary | ICD-10-CM

## 2010-05-23 DIAGNOSIS — I10 Essential (primary) hypertension: Secondary | ICD-10-CM

## 2010-05-23 DIAGNOSIS — R059 Cough, unspecified: Secondary | ICD-10-CM

## 2010-05-23 DIAGNOSIS — R05 Cough: Secondary | ICD-10-CM

## 2010-05-23 MED ORDER — LOSARTAN POTASSIUM 50 MG PO TABS: 50.0000 mg | ORAL_TABLET | Freq: Every day | ORAL | Status: DC

## 2010-05-23 NOTE — Patient Instructions (Signed)
We will call you with xray results.  If xray is negative, then see me in one month for your cough

## 2010-05-23 NOTE — Assessment & Plan Note (Addendum)
BP looks great. Will change to ARB b/o cough that may be ACEi related.  Will check bmp today.

## 2010-05-23 NOTE — Progress Notes (Signed)
  Subjective:    Patient ID: Brooke Kaiser, female    DOB: March 07, 1970, 40 y.o.   MRN: [id number]  HPI BP has been looking great.  Cough started about 2 months ago about the time started lisinopril. Now cough is more productive. Took mucinex but no relief.  Still smoking.  Says has coughed so hard she has had muscle spasm around her lungs.  She has to wear a pad because it is causing stress incontinence. No fever. No sinus congestion or runny nose. Has had some itchy eyes more over the last 3 weeks. Has been using her steroid inhaler.  No allergy meds. NO fever or cold sxs.    Review of Systems     Objective:   Physical Exam  Constitutional: She appears well-developed and well-nourished.  HENT:  Head: Normocephalic and atraumatic.  Right Ear: External ear normal.  Left Ear: External ear normal.  Nose: Nose normal.  Mouth/Throat: Oropharynx is clear and moist.  Eyes: Conjunctivae and EOM are normal. Pupils are equal, round, and reactive to light.  Neck: Neck supple. No thyromegaly present.  Cardiovascular: Normal rate and regular rhythm.   Murmur heard.      2/6 SEM best heard at the left sternal border.   Pulmonary/Chest: Effort normal and breath sounds normal.  Lymphadenopathy:    She has no cervical adenopathy.  Skin: Skin is warm and dry.  Psychiatric: She has a normal mood and affect.          Assessment & Plan:  Chronic cough - Will get CXR to rule out infection, thought likely either viral or from her ACEi.  If CXR then will see if cough improves off the ACE over 3 weeks.  Will change to ARB.

## 2010-05-23 NOTE — Telephone Encounter (Signed)
NOrmal CXR.  See if changing the BP pill improves your cough over the next 3 weeks.

## 2010-05-24 NOTE — Telephone Encounter (Signed)
Pt notififed

## 2010-06-13 ENCOUNTER — Other Ambulatory Visit: Payer: Self-pay | Admitting: *Deleted

## 2010-06-13 MED ORDER — ALBUTEROL SULFATE HFA 108 (90 BASE) MCG/ACT IN AERS: 4.0000 | INHALATION_SPRAY | Freq: Two times a day (BID) | RESPIRATORY_TRACT | Status: DC | PRN

## 2010-06-14 ENCOUNTER — Encounter (HOSPITAL_COMMUNITY): Payer: 59 | Admitting: Psychology

## 2010-06-16 ENCOUNTER — Encounter (HOSPITAL_COMMUNITY): Payer: 59 | Admitting: Psychology

## 2010-06-20 ENCOUNTER — Ambulatory Visit (INDEPENDENT_AMBULATORY_CARE_PROVIDER_SITE_OTHER): Payer: 59 | Admitting: Family Medicine

## 2010-06-20 DIAGNOSIS — J329 Chronic sinusitis, unspecified: Secondary | ICD-10-CM

## 2010-06-20 DIAGNOSIS — I10 Essential (primary) hypertension: Secondary | ICD-10-CM

## 2010-06-20 DIAGNOSIS — J45909 Unspecified asthma, uncomplicated: Secondary | ICD-10-CM

## 2010-06-20 DIAGNOSIS — K219 Gastro-esophageal reflux disease without esophagitis: Secondary | ICD-10-CM

## 2010-06-20 MED ORDER — ALBUTEROL SULFATE HFA 108 (90 BASE) MCG/ACT IN AERS: 4.0000 | INHALATION_SPRAY | Freq: Two times a day (BID) | RESPIRATORY_TRACT | Status: DC | PRN

## 2010-06-20 MED ORDER — SULFAMETHOXAZOLE-TMP DS 800-160 MG PO TABS: 1.0000 | ORAL_TABLET | Freq: Two times a day (BID) | ORAL | Status: AC

## 2010-06-20 MED ORDER — LOSARTAN POTASSIUM 100 MG PO TABS: 100.0000 mg | ORAL_TABLET | Freq: Every day | ORAL | Status: DC

## 2010-06-20 NOTE — Patient Instructions (Addendum)
Avoid greasy, spicey, and acidic foods. Stop smoking No carbonated drinks or caffeine.   Will try dexilant instead of protonix.  Call if sinus infection is not better in 10 days.

## 2010-06-20 NOTE — Progress Notes (Signed)
  Subjective:    Patient ID: Brooke Kaiser, female    DOB: [birth year], 40 y.o.   MRN: [id number]  HPI  Her initial chronic cough improved after changing her to an ARB.Tolerating the new med well. No SE.   Cough for 3 weeks, nasal drainage and severe HA, frontal.  Post nasal drip, green.  No fever.  NO ear pain. Cough is productive.  No meds current for symptomatic care.  No SOB, thought she continues to smoke. She says her cough was better when she stopped smoking for 2 days. Also notes a pain in her right ant chest with cough.  She is having break through sxs on her protonix.  She says can take 2-3  A day and says it doesn't seem to help.  Getting brash. No fever.  Says having wet burps esp at night.   Review of Systems     Objective:   Physical Exam  Constitutional: She appears well-developed and well-nourished.  HENT:  Head: Normocephalic and atraumatic.  Right Ear: External ear normal.  Left Ear: External ear normal.  Nose: Nose normal.  Mouth/Throat: Oropharynx is clear and moist.       TM and canals are norma. bilaterally  Neck: Neck supple. No thyromegaly present.  Cardiovascular: Normal rate, regular rhythm and normal heart sounds.   Pulmonary/Chest: Effort normal and breath sounds normal.  Lymphadenopathy:    She has no cervical adenopathy.  Skin: Skin is warm and dry.  Psychiatric: She has a normal mood and affect.          Assessment & Plan:  Sinsusits - Rx with bactrim. She is allergic to augmentin.  Call if not better in 10 days  GERD - Severe with breakthrough sxs on protonix. Will start dexilant. If not better in 1 week in addition to making dietary changes and stopping her soda in particular then call the office and will refer her to GI for further eval. Reviewed reflux hygiene with her.

## 2010-06-20 NOTE — Assessment & Plan Note (Signed)
Increase losartan for better control and f/u for recheck in1 mo.

## 2010-07-07 ENCOUNTER — Encounter (INDEPENDENT_AMBULATORY_CARE_PROVIDER_SITE_OTHER): Payer: 59 | Admitting: Psychology

## 2010-07-07 DIAGNOSIS — F331 Major depressive disorder, recurrent, moderate: Secondary | ICD-10-CM

## 2010-07-07 DIAGNOSIS — F411 Generalized anxiety disorder: Secondary | ICD-10-CM

## 2010-07-11 ENCOUNTER — Ambulatory Visit
Admission: RE | Admit: 2010-07-11 | Discharge: 2010-07-11 | Disposition: A | Discharge status: Home / Self Care | Payer: 59 | Source: Ambulatory Visit | Attending: Family Medicine | Admitting: Family Medicine

## 2010-07-11 ENCOUNTER — Encounter: Payer: Self-pay | Admitting: Family Medicine

## 2010-07-11 ENCOUNTER — Ambulatory Visit (INDEPENDENT_AMBULATORY_CARE_PROVIDER_SITE_OTHER): Payer: 59 | Admitting: Family Medicine

## 2010-07-11 ENCOUNTER — Telehealth: Payer: Self-pay | Admitting: Family Medicine

## 2010-07-11 VITALS — BP 112/52 | HR 77 | Temp 98.1°F | Ht 68.0 in | Wt 241.0 lb

## 2010-07-11 DIAGNOSIS — R0781 Pleurodynia: Secondary | ICD-10-CM

## 2010-07-11 DIAGNOSIS — R053 Chronic cough: Secondary | ICD-10-CM

## 2010-07-11 DIAGNOSIS — R05 Cough: Secondary | ICD-10-CM

## 2010-07-11 DIAGNOSIS — R079 Chest pain, unspecified: Secondary | ICD-10-CM

## 2010-07-11 DIAGNOSIS — I10 Essential (primary) hypertension: Secondary | ICD-10-CM

## 2010-07-11 DIAGNOSIS — R059 Cough, unspecified: Secondary | ICD-10-CM

## 2010-07-11 MED ORDER — LOSARTAN POTASSIUM 100 MG PO TABS: 50.0000 mg | ORAL_TABLET | Freq: Every day | ORAL | Status: DC

## 2010-07-11 NOTE — Patient Instructions (Signed)
We will call you with the x-ray results. 

## 2010-07-11 NOTE — Assessment & Plan Note (Signed)
BP alsmost low today. Will cut pill in half and have her f/u in 6 weeks.Continue the good lifestyle changes.

## 2010-07-11 NOTE — Telephone Encounter (Signed)
Pt calls for result of CXR. 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

## 2010-07-11 NOTE — Progress Notes (Signed)
  Subjective:    Patient ID: Brooke Kaiser, female    DOB: 26-Jan-1971, 40 y.o.   MRN: [id number]  HPI Still has a slight cough. Feels a pullings sensation when she tries to sit up. Will have to brace her side when she coughs or sneezes.  No trauma. Pain starts on the right side.  No dysuria or hematuria.  No fever. Overall her cough is better after switching from an ACE inhibitor to an ARB.Marland Kitchen She is still smoking. Cough is mostly dry but occasionally productive. No nausea or sweats. Pain is aggravated with movement or twisting. It is not aggravated with eating.   Review of Systems     Objective:   Physical Exam  Constitutional: She appears well-developed and well-nourished.  Cardiovascular: Normal rate, regular rhythm and normal heart sounds.   Pulmonary/Chest: Effort normal and breath sounds normal. She exhibits tenderness.       She has some tenderness over the 12th rib on her right side. No epigastric or right upper quadrant tenderness.  Skin: Skin is warm and dry.  Psychiatric: She has a normal mood and affect.          Assessment & Plan:  Cough-I do think she is improved overall. She does continue to smoke says is certainly an exacerbating factor. If the cough doesn't resolve in next 1-2 months and we can consider getting repeat chest x-ray. She does have a history of benign tumor removed from her right lung. Also consider getting chest CT if the cough doesn't resolve. Also encouraged her to quit smoking.  Right rib pain-I will get an x-ray today to rule out a fracture. It is very unusual but I have seen patients get a rib fracture from a persistent cough. If this is negative then also could be missed a subtle strain and also from coughing. Certainly she can try taking anti-inflammatory and see if this helps.

## 2010-07-14 ENCOUNTER — Telehealth: Payer: Self-pay | Admitting: Family Medicine

## 2010-07-14 ENCOUNTER — Encounter (HOSPITAL_COMMUNITY): Payer: 59 | Admitting: Psychology

## 2010-07-14 MED ORDER — DEXLANSOPRAZOLE 60 MG PO CPDR: 60.0000 mg | DELAYED_RELEASE_CAPSULE | Freq: Every day | ORAL | Status: DC

## 2010-07-14 MED ORDER — CYCLOBENZAPRINE HCL 10 MG PO TABS: 10.0000 mg | ORAL_TABLET | Freq: Every evening | ORAL | Status: DC | PRN

## 2010-07-14 NOTE — Telephone Encounter (Signed)
OK, rx have been sent.

## 2010-07-14 NOTE — Telephone Encounter (Signed)
Patient called left message request to have dexilant refill called in to pharmacy ([facility]) Pt states that works very well and she is down to last two pills and she still has coupon for that med. Patient also request to have a muscle relaxer called for her tightness and pain in her chest [facility] to [facility] [place]. Pt request to know if that can be called in today if possible

## 2010-07-17 NOTE — Telephone Encounter (Signed)
Pt notified the Dexilant RX was sent to her pharmacy last Friday 07-14-10. Jarvis Newcomer, LPN [facility]

## 2010-07-21 ENCOUNTER — Encounter (INDEPENDENT_AMBULATORY_CARE_PROVIDER_SITE_OTHER): Payer: 59 | Admitting: Psychiatry

## 2010-07-21 ENCOUNTER — Encounter (HOSPITAL_COMMUNITY): Payer: Self-pay

## 2010-07-21 DIAGNOSIS — F339 Major depressive disorder, recurrent, unspecified: Secondary | ICD-10-CM

## 2010-07-21 DIAGNOSIS — F411 Generalized anxiety disorder: Secondary | ICD-10-CM

## 2010-07-24 ENCOUNTER — Encounter (HOSPITAL_COMMUNITY): Payer: 59 | Admitting: Psychology

## 2010-08-04 ENCOUNTER — Encounter (INDEPENDENT_AMBULATORY_CARE_PROVIDER_SITE_OTHER): Payer: BC Managed Care – PPO | Admitting: Psychology

## 2010-08-04 DIAGNOSIS — F331 Major depressive disorder, recurrent, moderate: Secondary | ICD-10-CM

## 2010-08-04 DIAGNOSIS — F411 Generalized anxiety disorder: Secondary | ICD-10-CM

## 2010-08-15 ENCOUNTER — Ambulatory Visit: Payer: 59 | Admitting: Family Medicine

## 2010-08-21 ENCOUNTER — Telehealth: Payer: Self-pay | Admitting: Family Medicine

## 2010-08-21 NOTE — Telephone Encounter (Signed)
Closed

## 2010-08-22 ENCOUNTER — Encounter (HOSPITAL_COMMUNITY): Payer: BC Managed Care – PPO | Admitting: Psychology

## 2010-08-25 ENCOUNTER — Other Ambulatory Visit: Payer: Self-pay | Admitting: Family Medicine

## 2010-09-04 ENCOUNTER — Other Ambulatory Visit: Payer: Self-pay | Admitting: Family Medicine

## 2010-09-04 ENCOUNTER — Encounter: Payer: Self-pay | Admitting: Family Medicine

## 2010-09-04 ENCOUNTER — Ambulatory Visit (INDEPENDENT_AMBULATORY_CARE_PROVIDER_SITE_OTHER): Payer: BC Managed Care – PPO | Admitting: Family Medicine

## 2010-09-04 ENCOUNTER — Telehealth: Payer: Self-pay | Admitting: Family Medicine

## 2010-09-04 ENCOUNTER — Ambulatory Visit
Admission: RE | Admit: 2010-09-04 | Discharge: 2010-09-04 | Disposition: A | Discharge status: Home / Self Care | Payer: BC Managed Care – PPO | Source: Ambulatory Visit | Attending: Family Medicine | Admitting: Family Medicine

## 2010-09-04 VITALS — BP 120/71 | HR 76 | Ht 68.0 in | Wt 240.0 lb

## 2010-09-04 DIAGNOSIS — W5501XA Bitten by cat, initial encounter: Secondary | ICD-10-CM

## 2010-09-04 DIAGNOSIS — R0781 Pleurodynia: Secondary | ICD-10-CM

## 2010-09-04 DIAGNOSIS — IMO0001 Reserved for inherently not codable concepts without codable children: Secondary | ICD-10-CM

## 2010-09-04 DIAGNOSIS — I10 Essential (primary) hypertension: Secondary | ICD-10-CM

## 2010-09-04 DIAGNOSIS — R079 Chest pain, unspecified: Secondary | ICD-10-CM

## 2010-09-04 MED ORDER — DOXYCYCLINE HYCLATE 100 MG PO TABS: 100.0000 mg | ORAL_TABLET | Freq: Two times a day (BID) | ORAL | Status: AC

## 2010-09-04 MED ORDER — IBUPROFEN 800 MG PO TABS: 800.0000 mg | ORAL_TABLET | Freq: Three times a day (TID) | ORAL | Status: AC | PRN

## 2010-09-04 NOTE — Telephone Encounter (Signed)
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

## 2010-09-04 NOTE — Progress Notes (Signed)
  Subjective:    Patient ID: Brooke Kaiser, female    DOB: 1970/09/11, 40 y.o.   MRN: [id number]  Back Pain Pertinent negatives include no chest pain.  Hypertension This is a chronic problem. The current episode started more than 1 year ago. The problem has been resolved since onset. The problem is controlled. Pertinent negatives include no chest pain or shortness of breath. There are no associated agents to hypertension. Risk factors for coronary artery disease include no known risk factors. Past treatments include angiotensin blockers. There are no compliance problems.    Cat bite on left arm about 1 week ago. Had a lump and that has improved. Did squeeze some pus out on the 2nd day. Used neosporin a couple of days. This is her cat and her cats have shots.    Feel on her back when helping a friend getting limbs down from a tree.  Now having pain on her right ribs, some muscle soreness.  Was getting some better and then sneezed and everything got worse again.  Using IBu - not helping at all. Painful to take a deep breath.  Tried heating pad and icy hot last night. Can't lay on her stomach.  Pain is mostly over the distal ribs under the axilla and under the right breast.   Review of Systems  Respiratory: Negative for shortness of breath.   Cardiovascular: Negative for chest pain.  Musculoskeletal: Positive for back pain.       Objective:   Physical Exam  Constitutional: She is oriented to person, place, and time. She appears well-developed and well-nourished.  HENT:  Head: Normocephalic and atraumatic.  Cardiovascular: Normal rate, regular rhythm and normal heart sounds.   Pulmonary/Chest: Effort normal and breath sounds normal.       Very tender over the distal rib below theaxilla and esp under the right breast.    Musculoskeletal: She exhibits edema.  Neurological: She is alert and oriented to person, place, and time.  Skin:       No bruising or rash under on the skin of her RT torso.  On her left forarm has 2 well healed puncture wounds that  Are not drianage. There is some firmness underneath each lesion. No erythema or sign of cellulitis.    Psychiatric: She has a normal mood and affect.          Assessment & Plan:  Cat bite - will tx  With doxy since allergic to augmentin. Call if swelling not completley resolved.   Rib Pain- Acute. Will get xrays to eval for rib facture. Can use IBU 800mg  tid prn for now until get xray results back.

## 2010-09-04 NOTE — Assessment & Plan Note (Addendum)
WEll controlled. Doing well back on the 100mg  losartan. F/u 33mo. Due for CMP and lipids. Given lab slip today.

## 2010-09-04 NOTE — Telephone Encounter (Signed)
Call pt: they saw a area on the 8th rib that looks like possible fracture.  Though it wasn't 100% clear.  May take 8-12 weeks to heal.  Avoid laying on that side if can and can use the IBU up to TID prn.

## 2010-09-04 NOTE — Telephone Encounter (Signed)
Pt notified. 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 Pt wants to know if this should be avoided and if she can have a note excusing her. Also pt wants to know if she can have a refill of flexeril

## 2010-09-05 ENCOUNTER — Encounter: Payer: Self-pay | Admitting: *Deleted

## 2010-09-05 ENCOUNTER — Other Ambulatory Visit: Payer: Self-pay | Admitting: *Deleted

## 2010-09-05 MED ORDER — CYCLOBENZAPRINE HCL 10 MG PO TABS: 10.0000 mg | ORAL_TABLET | Freq: Three times a day (TID) | ORAL | Status: DC | PRN

## 2010-09-06 ENCOUNTER — Encounter (INDEPENDENT_AMBULATORY_CARE_PROVIDER_SITE_OTHER): Payer: BC Managed Care – PPO | Admitting: Psychology

## 2010-09-06 DIAGNOSIS — F331 Major depressive disorder, recurrent, moderate: Secondary | ICD-10-CM

## 2010-09-06 DIAGNOSIS — F411 Generalized anxiety disorder: Secondary | ICD-10-CM

## 2010-09-21 ENCOUNTER — Encounter (HOSPITAL_COMMUNITY): Payer: BC Managed Care – PPO | Admitting: Psychology

## 2010-09-22 ENCOUNTER — Encounter (HOSPITAL_COMMUNITY): Payer: BC Managed Care – PPO | Admitting: Psychology

## 2010-10-12 ENCOUNTER — Other Ambulatory Visit: Payer: Self-pay | Admitting: Family Medicine

## 2010-10-16 ENCOUNTER — Encounter (INDEPENDENT_AMBULATORY_CARE_PROVIDER_SITE_OTHER): Payer: BC Managed Care – PPO | Admitting: Psychology

## 2010-10-16 DIAGNOSIS — F331 Major depressive disorder, recurrent, moderate: Secondary | ICD-10-CM

## 2010-10-20 ENCOUNTER — Encounter (INDEPENDENT_AMBULATORY_CARE_PROVIDER_SITE_OTHER): Payer: BC Managed Care – PPO | Admitting: Psychiatry

## 2010-10-20 DIAGNOSIS — F319 Bipolar disorder, unspecified: Secondary | ICD-10-CM

## 2010-10-27 ENCOUNTER — Other Ambulatory Visit: Payer: Self-pay | Admitting: Family Medicine

## 2010-10-27 MED ORDER — LOSARTAN POTASSIUM 100 MG PO TABS: 100.0000 mg | ORAL_TABLET | Freq: Every day | ORAL | Status: DC

## 2010-10-27 NOTE — Telephone Encounter (Signed)
Pt called for refill of her cozarr 100mg . Plan:  Refilled for #30/5 refills and [facility] to [facility]. Jarvis Newcomer, LPN [facility]

## 2010-11-08 ENCOUNTER — Encounter (HOSPITAL_COMMUNITY): Payer: BC Managed Care – PPO | Admitting: Psychology

## 2010-11-22 ENCOUNTER — Other Ambulatory Visit: Payer: Self-pay | Admitting: Family Medicine

## 2010-11-23 ENCOUNTER — Ambulatory Visit: Payer: BC Managed Care – PPO | Admitting: Family Medicine

## 2010-11-28 ENCOUNTER — Encounter (HOSPITAL_COMMUNITY): Payer: Self-pay

## 2010-11-30 ENCOUNTER — Encounter (INDEPENDENT_AMBULATORY_CARE_PROVIDER_SITE_OTHER): Payer: BC Managed Care – PPO | Admitting: Psychology

## 2010-11-30 DIAGNOSIS — F411 Generalized anxiety disorder: Secondary | ICD-10-CM

## 2010-11-30 DIAGNOSIS — F331 Major depressive disorder, recurrent, moderate: Secondary | ICD-10-CM

## 2010-12-11 ENCOUNTER — Encounter: Payer: Self-pay | Admitting: *Deleted

## 2010-12-11 ENCOUNTER — Emergency Department
Admission: EM | Admit: 2010-12-11 | Discharge: 2010-12-11 | Disposition: A | Discharge status: Home / Self Care | Payer: BC Managed Care – PPO | Source: Home / Self Care | Attending: Emergency Medicine | Admitting: Emergency Medicine

## 2010-12-11 DIAGNOSIS — R05 Cough: Secondary | ICD-10-CM

## 2010-12-11 DIAGNOSIS — R059 Cough, unspecified: Secondary | ICD-10-CM

## 2010-12-11 HISTORY — DX: Major depressive disorder, single episode, unspecified: F32.9

## 2010-12-11 HISTORY — DX: Depression, unspecified: F32.A

## 2010-12-11 HISTORY — DX: Anxiety disorder, unspecified: F41.9

## 2010-12-11 MED ORDER — AZITHROMYCIN 250 MG PO TABS: ORAL_TABLET | ORAL | Status: AC

## 2010-12-11 MED ORDER — PREDNISONE (PAK) 10 MG PO TABS: 10.0000 mg | ORAL_TABLET | Freq: Every day | ORAL | Status: AC

## 2010-12-11 NOTE — ED Notes (Signed)
Pt c/o productive cough x 5 days, worse x 2 days. She has taken theraflu with no relief. No fever.

## 2010-12-11 NOTE — ED Provider Notes (Signed)
History     CSN: [id number] Arrival date & time: 12/11/2010  9:54 AM   First MD Initiated Contact with Patient 12/11/10 [phone]      Chief Complaint  Patient presents with  . Cough    (Consider location/radiation/quality/duration/timing/severity/associated sxs/prior treatment) HPI Brooke Kaiser is a 40 y.o. female who complains of onset of cold symptoms for 5 days.  They are using albuterol which helps a little bit. No sore throat + cough No pleuritic pain +/- wheezing +nasal congestion + post-nasal drainage + sinus pain/pressure + chest congestion No itchy/red eyes No earache No hemoptysis No SOB No chills/sweats No fever No nausea No vomiting No abdominal pain No diarrhea No skin rashes No fatigue No myalgias No headache    Past Medical History  Diagnosis Date  . Abnormal thyroid blood test   . Hypertension   . Anemia   . Depression   . Anxiety     Past Surgical History  Procedure Date  . 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 Cancer Mother 69    Breast  . Colon polyps Mother   . Other Mother     colon polyps  . Alcohol abuse Father   . Depression Father   . Suicidality Father     commited suicide  . Colon polyps Maternal Aunt     cancerous colon polyps  . Cancer Maternal Uncle     cancerous colon polyps  . Heart disease Other   . Other Other     thyroid disorder    History  Substance Use Topics  . Smoking status: Current Everyday Smoker -- 1.0 packs/day  . Smokeless tobacco: Not on file  . Alcohol Use: Yes    OB History    Grav Para Term Preterm Abortions TAB SAB Ect Mult Living                  Review of Systems  Allergies  Codeine and JXB:JYNWGNFAOZH+YQMVHQION+GEXBMWUXLK acid+aspartame  Home Medications   Current Outpatient Rx  Name Route Sig Dispense Refill  . ALBUTEROL SULFATE HFA 108 (90 BASE) MCG/ACT IN AERS Inhalation Inhale 4 puffs into the lungs 2  (two) times daily as needed. 2-4 puffs inhaled bid prn 8.5 g 2  . ALPRAZOLAM 0.5 MG PO TABS Oral Take 0.5 mg by mouth daily as needed. For anxiety     . BECLOMETHASONE DIPROPIONATE 40 MCG/ACT IN AERS Inhalation Inhale 2 puffs into the lungs 2 (two) times daily.      . CYCLOBENZAPRINE HCL 10 MG PO TABS  TAKE 1 TABLET BY MOUTH EVERY 8 HOURS AS NEEDED FOR MUSCLE SPASMS 15 tablet 0  . DEXILANT 60 MG PO CPDR  TAKE ONE CAPSULE BY MOUTH EVERY DAY 30 capsule 2  . LAMOTRIGINE 100 MG PO TABS Oral Take 100 mg by mouth.      Marland Kitchen LAMOTRIGINE 150 MG PO TABS Oral Take 150 mg by mouth daily.      Marland Kitchen LOSARTAN POTASSIUM 100 MG PO TABS Oral Take 1 tablet (100 mg total) by mouth daily. 30 tablet 5  . PANTOPRAZOLE SODIUM 40 MG PO TBEC Oral Take 1 tablet (40 mg total) by mouth daily. 30 tablet 2  . VENLAFAXINE HCL 150 MG PO CP24 Oral Take 150 mg by mouth daily.      . VENLAFAXINE HCL 75 MG PO CP24 Oral Take 75 mg by mouth daily. TAKE 3 DAILY  BP 116/75  Pulse 86  Temp(Src) 99.4 F (37.4 C) (Oral)  Resp 16  Ht 5' 8.5" (1.74 m)  Wt 249 lb 8 oz (113.172 kg)  BMI 37.38 kg/m2  SpO2 97%  LMP 11/20/2010  Physical Exam  Nursing note and vitals reviewed. Constitutional: She is oriented to person, place, and time. She appears well-developed and well-nourished.  HENT:  Head: Normocephalic and atraumatic.  Right Ear: Tympanic membrane, external ear and ear canal normal.  Left Ear: Tympanic membrane, external ear and ear canal normal.  Nose: Mucosal edema and rhinorrhea present.  Mouth/Throat: Posterior oropharyngeal erythema present. No oropharyngeal exudate or posterior oropharyngeal edema.  Neck: Neck supple.  Cardiovascular: Regular rhythm and normal heart sounds.   Pulmonary/Chest: Effort normal. No respiratory distress. She has wheezes (Mild wheezes bilateral).  Neurological: She is alert and oriented to person, place, and time.  Skin: Skin is warm and dry.  Psychiatric: She has a normal mood and affect.  Her speech is normal.    ED Course  Procedures (including critical care time)  Labs Reviewed - No data to display No results found.   No diagnosis found.    MDM   Viral asthmatic bronchitis  1)  Take the prescribed antibiotic as instructed.  Hold for a few days, take prednisone first. 2)  Use nasal saline solution (over the counter) at least 3 times a day. 3)  Use over the counter decongestants like Zyrtec-D every 12 hours as needed to help with congestion.  If you have hypertension, do not take medicines with sudafed.  4)  Can take tylenol every 6 hours or motrin every 8 hours for pain or fever. 5)  Follow up with your primary doctor if no improvement in 5-7 days, sooner if increasing pain, fever, or new symptoms.       Lily Kocher, MD 12/11/10 1003

## 2010-12-29 ENCOUNTER — Encounter (HOSPITAL_COMMUNITY): Payer: BC Managed Care – PPO | Admitting: Psychiatry

## 2011-01-11 ENCOUNTER — Encounter: Payer: Self-pay | Admitting: Family Medicine

## 2011-01-15 ENCOUNTER — Encounter: Payer: Self-pay | Admitting: Family Medicine

## 2011-01-15 ENCOUNTER — Ambulatory Visit (INDEPENDENT_AMBULATORY_CARE_PROVIDER_SITE_OTHER): Payer: BC Managed Care – PPO | Admitting: Psychiatry

## 2011-01-15 ENCOUNTER — Ambulatory Visit (INDEPENDENT_AMBULATORY_CARE_PROVIDER_SITE_OTHER): Payer: BC Managed Care – PPO | Admitting: Family Medicine

## 2011-01-15 ENCOUNTER — Encounter (HOSPITAL_COMMUNITY): Payer: Self-pay | Admitting: Psychiatry

## 2011-01-15 VITALS — BP 102/64 | Ht 68.5 in | Wt 252.0 lb

## 2011-01-15 VITALS — BP 136/77 | HR 93 | Wt 252.0 lb

## 2011-01-15 DIAGNOSIS — Z23 Encounter for immunization: Secondary | ICD-10-CM

## 2011-01-15 DIAGNOSIS — F329 Major depressive disorder, single episode, unspecified: Secondary | ICD-10-CM

## 2011-01-15 DIAGNOSIS — M549 Dorsalgia, unspecified: Secondary | ICD-10-CM

## 2011-01-15 DIAGNOSIS — F411 Generalized anxiety disorder: Secondary | ICD-10-CM

## 2011-01-15 NOTE — Patient Instructions (Signed)
Back Pain, Adult Low back pain is very common. About 1 in 5 people have back pain.The cause of low back pain is rarely dangerous. The pain often gets better over time.About half of people with a sudden onset of back pain feel better in just 2 weeks. About 8 in 10 people feel better by 6 weeks.  CAUSES Some common causes of back pain include:  Strain of the muscles or ligaments supporting the spine.   Wear and tear (degeneration) of the spinal discs.   Arthritis.   Direct injury to the back.  DIAGNOSIS Most of the time, the direct cause of low back pain is not known.However, back pain can be treated effectively even when the exact cause of the pain is unknown.Answering your caregiver's questions about your overall health and symptoms is one of the most accurate ways to make sure the cause of your pain is not dangerous. If your caregiver needs more information, he or she may order lab work or imaging tests (X-rays or MRIs).However, even if imaging tests show changes in your back, this usually does not require surgery. HOME CARE INSTRUCTIONS For many people, back pain returns.Since low back pain is rarely dangerous, it is often a condition that people can learn to manageon their own.   Remain active. It is stressful on the back to sit or stand in one place. Do not sit, drive, or stand in one place for more than 30 minutes at a time. Take short walks on level surfaces as soon as pain allows.Try to increase the length of time you walk each day.   Do not stay in bed.Resting more than 1 or 2 days can delay your recovery.   Do not avoid exercise or work.Your body is made to move.It is not dangerous to be active, even though your back may hurt.Your back will likely heal faster if you return to being active before your pain is gone.   Pay attention to your body when you bend and lift. Many people have less discomfortwhen lifting if they bend their knees, keep the load close to their  bodies,and avoid twisting. Often, the most comfortable positions are those that put less stress on your recovering back.   Find a comfortable position to sleep. Use a firm mattress and lie on your side with your knees slightly bent. If you lie on your back, put a pillow under your knees.   Only take over-the-counter or prescription medicines as directed by your caregiver. Over-the-counter medicines to reduce pain and inflammation are often the most helpful.Your caregiver may prescribe muscle relaxant drugs.These medicines help dull your pain so you can more quickly return to your normal activities and healthy exercise.   Put ice on the injured area.   Put ice in a plastic bag.   Place a towel between your skin and the bag.   Leave the ice on for 15 to 20 minutes, 3 to 4 times a day for the first 2 to 3 days. After that, ice and heat may be alternated to reduce pain and spasms.   Ask your caregiver about trying back exercises and gentle massage. This may be of some benefit.   Avoid feeling anxious or stressed.Stress increases muscle tension and can worsen back pain.It is important to recognize when you are anxious or stressed and learn ways to manage it.Exercise is a great option.  SEEK MEDICAL CARE IF:  You have pain that is not relieved with rest or medicine.   You have   pain that does not improve in 1 week.   You have new symptoms.   You are generally not feeling well.  SEEK IMMEDIATE MEDICAL CARE IF:   You have pain that radiates from your back into your legs.   You develop new bowel or bladder control problems.   You have unusual weakness or numbness in your arms or legs.   You develop nausea or vomiting.   You develop abdominal pain.   You feel faint.  Document Released: 01/15/2005 Document Revised: 09/27/2010 Document Reviewed: 06/05/2010 [employer] Patient Information 2012 [employer]. 

## 2011-01-15 NOTE — Progress Notes (Signed)
   [facility] Follow-up Outpatient Visit  Brooke Kaiser 08-Apr-1970   Subjective: The patient is a 40 year old female has been followed by [facility] since May of 2011. I have been treating her since September of 2011. She is currently diagnosed with major depressive disorder along with anxiety disorder NOS at last appointment I went up on her Lamictal from 100 mg daily to 150 mg daily. The patient thought she was more irritable on this dose and and backed down to 100 mg daily. The patient is taking spring semester off. She is looking into a 4 year college to attend. She still working 60 hours a week. She's got a new girlfriend, she stated her 56 year old roommate. She feels that mood wise she is doing well. She sleeps with occasional use of Xanax. She endorses increased appetite, and is a her highest weight currently.    Ceasar Mons Vitals:   01/15/11 1404  BP: 102/64    Mental Status Examination  Appearance: Casual Alert: Yes Attention: good  Cooperative: Yes Eye Contact: Good Speech: Regular rate rhythm and volume Psychomotor Activity: Normal Memory/Concentration: Intact Oriented: person, place, time/date and situation Mood: Euthymic Affect: Full Range Thought Processes and Associations: Logical Fund of Knowledge: Fair Thought Content: No suicidal or homicidal thoughts Insight: Fair Judgement: Fair  Diagnosis: Anxiety disorder NOS, major depressive disorder Treatment Plan: At this point we'll not change her medications. We will continue her on Lamictal 100 mg daily, Effexor XR 225 mg daily, and Xanax when necessary. Patient return to clinic in 3 months.  Jamse Mead, MD

## 2011-01-15 NOTE — Progress Notes (Signed)
  Subjective:    Patient ID: Brooke Kaiser, female    DOB: [birth year], 40 y.o.   MRN: [id number]  HPI Rt low back pain x 2 wk.  Burning, sharp and intermittant. Occ radiates into the thigh and then will created a throbbing from knee to ankle. ADLs make it worse such as mopping or sweeping. Best when lays down. Taking 8-12 IBU a ay for pain relief - helps some. No injuries or falls or trauma. + prior hx back pain. Tried flexeril and didn't help. HAS GAINED soe weight.    Review of Systems     Objective:   Physical Exam  Constitutional: She appears well-developed and well-nourished.       She is obese.  Musculoskeletal:       Normal flexion, extension, rotation right and left and side bending. She did have pain with rotating to the right.  She has a palpable lump on the right paraspinous muscles. It is larger than the similar area compared on her left. I is exquisitely tender with palpation. It feels to be a spasm. She has a negative straight leg raise bilaterally. Hip, knee, ankle strength is 5 over 5 bilaterally. Patellar reflexes 1+ bilaterally.  Skin: Skin is warm and dry.  Psychiatric: She has a normal mood and affect. Her behavior is normal.          Assessment & Plan:  Low back pain, right - Discussed stretches and continue IBU 800mg  tid with food and water.  Use tennis ball to massage the area.  Can overlap with tylenol for better pain control. If she is not getting better the next 3-4 weeks please call the office and consider formal physical therapy. We'll hold off on Flexeril since it does not seem to be helping her low back. I did encourage her to work on losing weight if this does exacerbate back pain.

## 2011-01-26 ENCOUNTER — Other Ambulatory Visit (HOSPITAL_COMMUNITY): Payer: Self-pay | Admitting: Psychiatry

## 2011-01-26 ENCOUNTER — Other Ambulatory Visit: Payer: Self-pay | Admitting: Family Medicine

## 2011-01-26 DIAGNOSIS — F411 Generalized anxiety disorder: Secondary | ICD-10-CM

## 2011-01-26 MED ORDER — VENLAFAXINE HCL ER 75 MG PO CP24: 225.0000 mg | ORAL_CAPSULE | Freq: Every day | ORAL | Status: DC

## 2011-01-26 MED ORDER — ALPRAZOLAM 0.5 MG PO TABS: 0.5000 mg | ORAL_TABLET | Freq: Every day | ORAL | Status: DC | PRN

## 2011-01-29 ENCOUNTER — Other Ambulatory Visit (HOSPITAL_COMMUNITY): Payer: Self-pay | Admitting: Psychiatry

## 2011-01-29 DIAGNOSIS — F411 Generalized anxiety disorder: Secondary | ICD-10-CM

## 2011-01-29 MED ORDER — VENLAFAXINE HCL ER 75 MG PO CP24: 225.0000 mg | ORAL_CAPSULE | Freq: Every day | ORAL | Status: DC

## 2011-01-30 LAB — COMPLETE METABOLIC PANEL WITH GFR
ALT: 18 U/L (ref 0–35)
CO2: 24 mEq/L (ref 19–32)
Calcium: 9 mg/dL (ref 8.4–10.5)
Chloride: 104 mEq/L (ref 96–112)
Creat: 0.72 mg/dL (ref 0.50–1.10)
GFR, Est African American: >89 mL/min (ref >60)
GFR, Est Non African American: >89 mL/min (ref >60)
Glucose, Bld: 103 mg/dL — ABNORMAL HIGH (ref 70–99)
Total Protein: 6.9 g/dL (ref 6.0–8.3)

## 2011-01-30 LAB — LIPID PANEL
LDL Cholesterol: 73 mg/dL (ref 0–99)
Triglycerides: 382 mg/dL — ABNORMAL HIGH (ref <150)

## 2011-03-05 ENCOUNTER — Other Ambulatory Visit (HOSPITAL_COMMUNITY): Payer: Self-pay | Admitting: Psychiatry

## 2011-03-05 DIAGNOSIS — F411 Generalized anxiety disorder: Secondary | ICD-10-CM

## 2011-03-05 DIAGNOSIS — F329 Major depressive disorder, single episode, unspecified: Secondary | ICD-10-CM

## 2011-03-05 MED ORDER — LAMOTRIGINE 100 MG PO TABS: 100.0000 mg | ORAL_TABLET | Freq: Every day | ORAL | Status: DC

## 2011-03-28 ENCOUNTER — Ambulatory Visit (HOSPITAL_COMMUNITY): Payer: BC Managed Care – PPO | Admitting: Psychology

## 2011-04-20 ENCOUNTER — Ambulatory Visit (HOSPITAL_COMMUNITY): Payer: Self-pay | Admitting: Psychiatry

## 2011-04-30 ENCOUNTER — Telehealth: Payer: Self-pay | Admitting: *Deleted

## 2011-04-30 ENCOUNTER — Other Ambulatory Visit: Payer: Self-pay | Admitting: *Deleted

## 2011-04-30 DIAGNOSIS — R899 Unspecified abnormal finding in specimens from other organs, systems and tissues: Secondary | ICD-10-CM

## 2011-05-04 ENCOUNTER — Other Ambulatory Visit: Payer: Self-pay | Admitting: Family Medicine

## 2011-05-04 LAB — LIPID PANEL
Total CHOL/HDL Ratio: 4.4 Ratio
VLDL: 36 mg/dL (ref 0–40)

## 2011-06-29 ENCOUNTER — Ambulatory Visit (HOSPITAL_COMMUNITY): Payer: Self-pay | Admitting: Psychiatry

## 2011-06-29 ENCOUNTER — Ambulatory Visit: Payer: Self-pay | Admitting: Family Medicine

## 2011-07-30 ENCOUNTER — Encounter (HOSPITAL_COMMUNITY): Payer: Self-pay | Admitting: Psychiatry

## 2011-07-30 ENCOUNTER — Ambulatory Visit (INDEPENDENT_AMBULATORY_CARE_PROVIDER_SITE_OTHER): Payer: BC Managed Care – PPO | Admitting: Psychiatry

## 2011-07-30 VITALS — BP 111/75 | Ht 68.5 in | Wt 242.0 lb

## 2011-07-30 DIAGNOSIS — F329 Major depressive disorder, single episode, unspecified: Secondary | ICD-10-CM

## 2011-07-30 DIAGNOSIS — F411 Generalized anxiety disorder: Secondary | ICD-10-CM

## 2011-07-30 NOTE — Progress Notes (Signed)
   [facility] Follow-up Outpatient Visit  Brooke Kaiser September 20, 1970  The patient is a 41 year old female who has been followed by [facility] since May of 2011. I have been treating her since September of 2011. She is currently diagnosed with major depressive disorder along with generalized anxiety disorder. This is her first appointment since December. The patient reports that she could not afford her co-pay. The patient is still working at [employer] from 4 PM until 11 PM. 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 She has purchased her first house. She was able to pay $75,000 for it. She has  already put on a new roof. She has been saving money so that she could pay her mortgage this summer. Her mortgage is less than her rent was. The patient is no longer seriously dating her girlfriend. They find that they keep things casual, their relationship is much better. The patient informs me today that she has 12 cats. They're all indoor animals. I was not aware of this. Her mother and grandmother live in [place], but will be staying with her 2 weeks out of the month. She feels that her mom feels that she make some poor decisions. The patient endorses good sleep and appetite. She still has occasional short spells of emotionality, but feels that this is a human reaction. She continues to be compliant with her medication. She feels that is doing well. She denies any true mood symptoms, or anxiety.   Ceasar Mons Vitals:   07/30/11 1319  BP: 111/75    Mental Status Examination  Appearance: Casual Alert: Yes Attention: good  Cooperative: Yes Eye Contact: Good Speech: Regular rate rhythm and volume Psychomotor Activity: Normal Memory/Concentration: Intact Oriented: person, place, time/date and situation Mood: Euthymic Affect: Full Range Thought Processes and Associations: Logical Fund of Knowledge: Fair Thought Content: No suicidal or homicidal  thoughts Insight: Fair Judgement: Fair  Diagnosis: Anxiety disorder NOS, major depressive disorder Treatment Plan: At this point we'll not change her medications. We will continue her on Lamictal 100 mg daily, Effexor XR 225 mg daily, and Xanax when necessary. Patient return to clinic in 3 months.  Jamse Mead, MD

## 2011-08-09 ENCOUNTER — Other Ambulatory Visit: Payer: Self-pay | Admitting: Family Medicine

## 2011-08-09 ENCOUNTER — Other Ambulatory Visit (HOSPITAL_COMMUNITY): Payer: Self-pay | Admitting: Psychiatry

## 2011-08-09 DIAGNOSIS — F329 Major depressive disorder, single episode, unspecified: Secondary | ICD-10-CM

## 2011-08-09 DIAGNOSIS — F411 Generalized anxiety disorder: Secondary | ICD-10-CM

## 2011-08-09 MED ORDER — LAMOTRIGINE 100 MG PO TABS: 100.0000 mg | ORAL_TABLET | Freq: Every day | ORAL | Status: DC

## 2011-08-09 MED ORDER — ALPRAZOLAM 0.5 MG PO TABS: 0.5000 mg | ORAL_TABLET | Freq: Every day | ORAL | Status: DC | PRN

## 2011-08-10 ENCOUNTER — Other Ambulatory Visit (HOSPITAL_COMMUNITY): Payer: Self-pay | Admitting: Psychiatry

## 2011-08-10 DIAGNOSIS — F411 Generalized anxiety disorder: Secondary | ICD-10-CM

## 2011-08-10 MED ORDER — VENLAFAXINE HCL ER 75 MG PO CP24: 225.0000 mg | ORAL_CAPSULE | Freq: Every day | ORAL | Status: DC

## 2011-08-14 ENCOUNTER — Other Ambulatory Visit (HOSPITAL_COMMUNITY): Payer: Self-pay | Admitting: Psychiatry

## 2011-09-21 ENCOUNTER — Ambulatory Visit: Payer: Self-pay | Admitting: Family Medicine

## 2011-09-26 ENCOUNTER — Other Ambulatory Visit: Payer: Self-pay | Admitting: Family Medicine

## 2011-10-16 ENCOUNTER — Encounter: Payer: Self-pay | Admitting: Sports Medicine

## 2011-10-16 ENCOUNTER — Ambulatory Visit (INDEPENDENT_AMBULATORY_CARE_PROVIDER_SITE_OTHER): Payer: 59 | Admitting: Sports Medicine

## 2011-10-16 VITALS — BP 134/73 | HR 85 | Temp 97.7°F | Resp 18 | Wt 243.0 lb

## 2011-10-16 DIAGNOSIS — J45909 Unspecified asthma, uncomplicated: Secondary | ICD-10-CM | POA: Insufficient documentation

## 2011-10-16 MED ORDER — AZITHROMYCIN 250 MG PO TABS: ORAL_TABLET | ORAL | Status: DC

## 2011-10-16 MED ORDER — HYDROCOD POLST-CPM POLST ER 10-8 MG PO CP12: 1.0000 | ORAL_CAPSULE | Freq: Three times a day (TID) | ORAL | Status: DC | PRN

## 2011-10-16 MED ORDER — PREDNISONE 50 MG PO TABS: 50.0000 mg | ORAL_TABLET | Freq: Every day | ORAL | Status: DC

## 2011-10-16 NOTE — Progress Notes (Signed)
Subjective:    CC: Cough, sore throat  HPI: Brooke Kaiser is a very pleasant 41 year old female who comes in with a one to two-day history of cough, and sore throat. The cough is productive of yellowish, and rusty colored sputum. She denies any pain in her chest, but notes a mild sore throat. She is having occasional chills, no shortness of breath. She does have a history of asthmatic bronchitis. The cough is worse at night, and does tend to keep her up.  Past medical history, Surgical history, Family history, Social history, Allergies, and medications have been entered into the medical record, reviewed, and no changes needed.   Review of Systems: No fevers, chills, night sweats, weight loss, chest pain, or shortness of breath.   Objective:    General: Well Developed, well nourished, and in no acute distress.  Neuro: Alert and oriented x3, extra-ocular muscles intact.  HEENT: Normocephalic, atraumatic, pupils equal round reactive to light, neck supple, no masses, thyroid nonpalpable. No tonsillar exudates, no pharyngeal erythema. No cervical lymphadenopathy. Skin: Warm and dry, no rashes. Cardiac: Regular rate and rhythm, no murmurs rubs or gallops.  Respiratory: Clear to auscultation bilaterally. Not using accessory muscles, speaking in full sentences.  Impression and Recommendations:

## 2011-10-16 NOTE — Assessment & Plan Note (Signed)
History of asthma so we'll treat aggressively. Centor score is zero, so no strep test. Prednisone, azithromycin, Tussionex. Patient notes that her allergy to codeine which consisted of nausea occurred immediately after getting her teeth pulled as a child. She does want to try a cough suppressant with hydrocodone in it. She'll come back to see [place] on an as-needed basis.

## 2011-10-30 ENCOUNTER — Ambulatory Visit (INDEPENDENT_AMBULATORY_CARE_PROVIDER_SITE_OTHER): Payer: 59 | Admitting: Family Medicine

## 2011-10-30 ENCOUNTER — Encounter: Payer: Self-pay | Admitting: Family Medicine

## 2011-10-30 VITALS — BP 142/80 | HR 92 | Wt 245.0 lb

## 2011-10-30 DIAGNOSIS — E669 Obesity, unspecified: Secondary | ICD-10-CM

## 2011-10-30 DIAGNOSIS — I10 Essential (primary) hypertension: Secondary | ICD-10-CM

## 2011-10-30 DIAGNOSIS — J45909 Unspecified asthma, uncomplicated: Secondary | ICD-10-CM

## 2011-10-30 DIAGNOSIS — F17200 Nicotine dependence, unspecified, uncomplicated: Secondary | ICD-10-CM

## 2011-10-30 DIAGNOSIS — R1012 Left upper quadrant pain: Secondary | ICD-10-CM

## 2011-10-30 DIAGNOSIS — Z720 Tobacco use: Secondary | ICD-10-CM

## 2011-10-30 MED ORDER — BECLOMETHASONE DIPROPIONATE 80 MCG/ACT IN AERS: 2.0000 | INHALATION_SPRAY | Freq: Two times a day (BID) | RESPIRATORY_TRACT | Status: DC

## 2011-10-30 NOTE — Progress Notes (Signed)
Subjective:    Patient ID: Brooke Kaiser, female    DOB: 1970-12-12, 41 y.o.   MRN: [id number]  HPI Noticed some knots in the LUQ about 8 months ago. Noticed  A few more.  Says they are uncomfortable at times but not sig painful.  NO GI sxs.  Noticed more SOB over the last couple of weeks.  Was sick and given a zpack a couple of weeks ago.  Says still has a cough. Still smokes.  Smoking 10 cig per day.  Plans on quitting in 2 weeks.  Diarrhea the last couple of days. No fever. Says she has more of generalized tenderness over the LUQ.  She says sometimes she notices it more when she stands up. No other worsening or alleviating symptoms. She was having use her albuterol 4 times a day when she was seen for recent illness. Now she is only using it twice a day.  She's interested in taking a medication for weight loss would like to discuss that today.  Review of Systems     Objective:   Physical Exam  Constitutional: She is oriented to person, place, and time. She appears well-developed and well-nourished.  HENT:  Head: Normocephalic and atraumatic.  Right Ear: External ear normal.  Left Ear: External ear normal.  Nose: Nose normal.  Mouth/Throat: Oropharynx is clear and moist.       TMs and canals are clear.   Eyes: Conjunctivae normal and EOM are normal. Pupils are equal, round, and reactive to light.  Neck: Neck supple. No thyromegaly present.  Cardiovascular: Normal rate, regular rhythm and normal heart sounds.   Pulmonary/Chest: Effort normal and breath sounds normal. She has no wheezes.  Abdominal: Soft. Bowel sounds are normal. She exhibits no distension. There is tenderness. There is no rebound and no guarding.       Some left epigastric tenderness  Lymphadenopathy:    She has no cervical adenopathy.  Neurological: She is alert and oriented to person, place, and time.  Skin: Skin is warm and dry.  Psychiatric: She has a normal mood and affect.          Assessment & Plan:    LUQ discomfrot- will check CBCw/ diff and amylase and lipase.  No red flag symptoms on exam. We'll make sure her lab works normal. No recent change in bowels. No fever.  Lipomas, small - Gave reassuranc.e . Call if they're getting worse or become more bothersome.  HTN- Uncontrolled.  Recheck today.  Still elevated. Work on exercise and diet and recheck in one month at followup. Continue current medications. Make sure working on low salt diet.  Abnormal weight gain- we discussed different options. She is most interested in a medication for weight loss. I discussed that her blood pressure needs to be well-controlled and she needs to have an active diet and exercise regimen. I recommend following up in one month after she comes up with a diet plan start an exercise routine and that her blood pressures well controlled we can discuss the pros and cons of taking the medication and potentially start it.  Flu vaccine given.   Tobacco abuse-encourage cessation. She says she plans on quitting in about 2 weeks.  Asthma-moderate/persistent-we will put her on inhaled corticosteroid. We will start with Qvar. I explained how we typically will continue this at least for couple months to calm down the inflammation her lungs and then hopefully wean her off so she can go back to just using her  air as needed.

## 2011-10-31 ENCOUNTER — Ambulatory Visit (HOSPITAL_COMMUNITY): Payer: Self-pay | Admitting: Psychiatry

## 2011-10-31 LAB — CBC AND DIFFERENTIAL
HCT: 29 % — AB (ref 36–46)
Hemoglobin: 8.6 g/dL — AB (ref 12.0–16.0)
Platelets: 406 10*3/uL — AB (ref 150–399)
WBC: 8.3 10^3/mL

## 2011-10-31 LAB — HEPATIC FUNCTION PANEL
AST: 12 U/L — AB (ref 13–35)
Bilirubin, Total: 0.3 mg/dL

## 2011-10-31 LAB — BASIC METABOLIC PANEL
Glucose: 195 mg/dL
Sodium: 136 mmol/L — AB (ref 137–147)

## 2011-11-07 ENCOUNTER — Telehealth: Payer: Self-pay | Admitting: Family Medicine

## 2011-11-07 DIAGNOSIS — D649 Anemia, unspecified: Secondary | ICD-10-CM

## 2011-11-07 DIAGNOSIS — K649 Unspecified hemorrhoids: Secondary | ICD-10-CM

## 2011-11-07 NOTE — Telephone Encounter (Signed)
Patient called and lmom stating she had blood work done a while back ago and nobody ever called her with results. Please call patient back as soon as you get a chance. Thanks, [employer]

## 2011-11-08 ENCOUNTER — Encounter: Payer: Self-pay | Admitting: *Deleted

## 2011-11-08 ENCOUNTER — Telehealth: Payer: Self-pay | Admitting: *Deleted

## 2011-11-08 LAB — AMYLASE
Amylase: 32 units/L (ref 25–110)
Calcium: 8.6 mg/dL
MCV: 72.8 fL — AB (ref 78–100)

## 2011-11-08 NOTE — Telephone Encounter (Signed)
Labs ordered on 10/1. We never got the results. Call lab and have them fax results.

## 2011-11-08 NOTE — Telephone Encounter (Signed)
To Dr. Metheney 

## 2011-11-08 NOTE — Telephone Encounter (Signed)
LM to call back.

## 2011-11-08 NOTE — Telephone Encounter (Signed)
If she is bleeding that much then I want her to see a colorectal surgeon. If ok with referral then let me know.

## 2011-11-08 NOTE — Telephone Encounter (Signed)
Patient aware of labs and will do additional labs on Monday States lots of rectal bleeding and thinks its hemorrhoids

## 2011-11-08 NOTE — Telephone Encounter (Signed)
Call pt: Her labs never came through that we had a call to get them. The results showed that her metabolic panel is okay except her glucose was high. Her white count was normal. No sign of infection. She is definitely anemic with a hemoglobin of 8.6. This is down from last time.  Is she having heavy periods or blood in the stool.  Her liver enzymes are normal. Her pancreas enzymes are normal. Please let her know that she'll have to go back to the lab so that we can check a ferritin and B12 level.

## 2011-11-09 NOTE — Telephone Encounter (Signed)
Yes she is ok with a referral and wants local area

## 2011-11-09 NOTE — Telephone Encounter (Signed)
Referral placed.

## 2011-11-10 ENCOUNTER — Other Ambulatory Visit: Payer: Self-pay | Admitting: Family Medicine

## 2011-11-13 ENCOUNTER — Telehealth: Payer: Self-pay | Admitting: *Deleted

## 2011-11-13 ENCOUNTER — Telehealth: Payer: Self-pay | Admitting: Family Medicine

## 2011-11-13 DIAGNOSIS — Z23 Encounter for immunization: Secondary | ICD-10-CM

## 2011-11-13 LAB — FERRITIN: Ferritin: 4 ng/mL — ABNORMAL LOW (ref 10–291)

## 2011-11-13 NOTE — Telephone Encounter (Signed)
See result notes. 

## 2011-11-13 NOTE — Telephone Encounter (Signed)
Patient called requesting labs from yesterday, given Ferritin and Iron levels are low.

## 2011-11-13 NOTE — Telephone Encounter (Signed)
error 

## 2011-11-14 ENCOUNTER — Telehealth: Payer: Self-pay | Admitting: *Deleted

## 2011-11-14 NOTE — Telephone Encounter (Signed)
Message copied by Florestine Avers on Wed Nov 14, 2011 12:21 PM ------      Message from: Nani Gasser D      Created: Tue Nov 13, 2011  5:22 PM       Call patient: There is tenderness low. This is a sign of low iron stores. Would like her start an over-the-counter daily iron supplement. Sometimes this can be constipating so she may want to take a daily stool softener with it. I would like to recheck her ferritin and hemoglobin level and reticulocyte count in 8 weeks.

## 2011-11-22 ENCOUNTER — Encounter (INDEPENDENT_AMBULATORY_CARE_PROVIDER_SITE_OTHER): Payer: Self-pay | Admitting: General Surgery

## 2011-11-22 ENCOUNTER — Ambulatory Visit (INDEPENDENT_AMBULATORY_CARE_PROVIDER_SITE_OTHER): Payer: 59 | Admitting: General Surgery

## 2011-11-22 VITALS — BP 126/80 | HR 76 | Temp 96.8°F | Resp 16 | Ht 69.0 in | Wt 248.2 lb

## 2011-11-22 DIAGNOSIS — K649 Unspecified hemorrhoids: Secondary | ICD-10-CM

## 2011-11-22 NOTE — Progress Notes (Signed)
Subjective:     Patient ID: Brooke Kaiser, female   DOB: 1970-06-27, 41 y.o.   MRN: [id number]  HPI The patient is a 41 year old female who is referred by Dr. Linford Arnold  For hemorrhoids as well as anemia. Patient states she's had prior bloody stool in the bowl for her last last 3 years. Pasty she does have pain at times however does have pain with bleeding into the bowl. Patient is a previous colonoscopy but did does ask whether that would be beneficial at this point. Patient states she does have an increased amount of flatus at times. Patient is a she's increased her fiber by eating beans at this point. She also states that drinking aloe vera juice does change her bowel habits.    Review of Systems  Constitutional: Negative.   HENT: Negative.   Eyes: Negative.   Respiratory: Negative.   Cardiovascular: Negative.   Gastrointestinal: Negative.   Neurological: Negative.        Objective:   Physical Exam  Constitutional: She is oriented to person, place, and time. She appears well-developed and well-nourished.  HENT:  Head: Normocephalic and atraumatic.  Eyes: Conjunctivae normal and EOM are normal. Pupils are equal, round, and reactive to light.  Neck: Normal range of motion. Neck supple.  Cardiovascular: Normal rate and regular rhythm.   Pulmonary/Chest: Effort normal and breath sounds normal.  Abdominal: Soft. Bowel sounds are normal.  Genitourinary:     Neurological: She is alert and oriented to person, place, and time.       Assessment:     This is a 41 year old female with internal and external hemorrhoid disease. Patient also with anemia and what was secondary to bleeding from hemorrhoids.    Plan:     1. We discussed the pathophysiology of hemorrhoids and the need for increased her fiber intake to help with having easier bowel movements.  We discussed possibility of colonoscopy as well as sigmoidoscopy. This likely referral PCP to a GI doctor  2. Was the patient back in  6 months for followup of her hemorrhoid disease.  Should patient have any questions or concerns prior to this time patient in cough or other followup.

## 2011-12-04 ENCOUNTER — Ambulatory Visit (INDEPENDENT_AMBULATORY_CARE_PROVIDER_SITE_OTHER): Payer: 59 | Admitting: Family Medicine

## 2011-12-04 ENCOUNTER — Encounter: Payer: Self-pay | Admitting: Family Medicine

## 2011-12-04 ENCOUNTER — Other Ambulatory Visit (HOSPITAL_COMMUNITY)
Admission: RE | Admit: 2011-12-04 | Discharge: 2011-12-04 | Disposition: A | Discharge status: Home / Self Care | Payer: Self-pay | Source: Ambulatory Visit | Attending: Family Medicine | Admitting: Family Medicine

## 2011-12-04 VITALS — BP 139/78 | HR 86 | Ht 68.5 in | Wt 245.0 lb

## 2011-12-04 DIAGNOSIS — Z01419 Encounter for gynecological examination (general) (routine) without abnormal findings: Secondary | ICD-10-CM

## 2011-12-04 DIAGNOSIS — Z113 Encounter for screening for infections with a predominantly sexual mode of transmission: Secondary | ICD-10-CM | POA: Insufficient documentation

## 2011-12-04 DIAGNOSIS — E781 Pure hyperglyceridemia: Secondary | ICD-10-CM

## 2011-12-04 DIAGNOSIS — Z23 Encounter for immunization: Secondary | ICD-10-CM

## 2011-12-04 DIAGNOSIS — R87810 Cervical high risk human papillomavirus (HPV) DNA test positive: Secondary | ICD-10-CM | POA: Insufficient documentation

## 2011-12-04 DIAGNOSIS — Z1151 Encounter for screening for human papillomavirus (HPV): Secondary | ICD-10-CM | POA: Insufficient documentation

## 2011-12-04 NOTE — Progress Notes (Signed)
  Subjective:     Brooke Kaiser is a 41 y.o. female and is here for a comprehensive physical exam. The patient reports problems - Periods have been irregular.  she was having she was going through menopause but started having her period again. She said her last one was a little longer than usual. She also woke up with some cramping and wonders if she might actually start to spot today. She denies any vaginal pain or discharge. She still occasionally has some left sided abdominal pain. Please see previous note.  History   Social History  . Marital Status: Single    Spouse Name: N/A    Number of Children: N/A  . Years of Education: N/A   Occupational History  . Not on file.   Social History Main Topics  . Smoking status: Current Every Day Smoker -- 1.0 packs/day  . Smokeless tobacco: Not on file  . Alcohol Use: Yes  . Drug Use: No  . Sexually Active: Not on file     Comment: prefers female sex partner   Other Topics Concern  . Not on file   Social History Narrative  . No narrative on file   Health Maintenance  Topic Date Due  . Influenza Vaccine  09/30/2011  . Pap Smear  12/04/2014  . Tetanus/tdap  12/10/2018    The following portions of the patient's history were reviewed and updated as appropriate: allergies, current medications, past family history, past medical history, past social history, past surgical history and problem list.  Review of Systems A comprehensive review of systems was negative.   Objective:    BP 139/78  Pulse 86  Ht 5' 8.5" (1.74 m)  Wt 245 lb (111.131 kg)  BMI 36.71 kg/m2 General appearance: alert, cooperative and appears stated age Head: Normocephalic, without obvious abnormality, atraumatic Eyes: conj clear, EOMi, PEERLA Ears: normal TM's and external ear canals both ears Nose: Nares normal. Septum midline. Mucosa normal. No drainage or sinus tenderness. Throat: lips, mucosa, and tongue normal; teeth and gums normal Neck: no adenopathy,  supple, symmetrical, trachea midline and thyroid not enlarged, symmetric, no tenderness/mass/nodules Back: symmetric, no curvature. ROM normal. No CVA tenderness. Lungs: clear to auscultation bilaterally Breasts: normal appearance, no masses or tenderness Heart: regular rate and rhythm, S1, S2 normal, no murmur, click, rub or gallop Abdomen: soft, non-tender; bowel sounds normal; no masses,  no organomegaly Pelvic: cervix normal in appearance, external genitalia normal, no adnexal masses or tenderness, no cervical motion tenderness, rectovaginal septum normal, uterus normal size, shape, and consistency and vagina normal without discharge Extremities: extremities normal, atraumatic, no cyanosis or edema Pulses: 2+ and symmetric Skin: Skin color, texture, turgor normal. No rashes or lesions Lymph nodes: Cervical, supraclavicular, and axillary nodes normal. Neurologic: Grossly normal    Assessment:    Healthy female exam.      Plan:     See After Visit Summary for Counseling Recommendations  Start a regular exercise program and make sure you are eating a healthy diet Try to eat 4 servings of dairy a day or take a calcium supplement (500mg  twice a day). Your vaccines are up to date.  Would like STD testing.

## 2011-12-04 NOTE — Patient Instructions (Addendum)
Start a regular exercise program and make sure you are eating a healthy diet Try to eat 4 servings of dairy a day

## 2011-12-12 NOTE — Telephone Encounter (Signed)
See other message

## 2012-02-15 ENCOUNTER — Ambulatory Visit (INDEPENDENT_AMBULATORY_CARE_PROVIDER_SITE_OTHER): Payer: 59 | Admitting: Psychiatry

## 2012-02-15 ENCOUNTER — Encounter (HOSPITAL_COMMUNITY): Payer: Self-pay | Admitting: Psychiatry

## 2012-02-15 VITALS — BP 110/72 | Ht 68.5 in | Wt 255.0 lb

## 2012-02-15 DIAGNOSIS — F331 Major depressive disorder, recurrent, moderate: Secondary | ICD-10-CM

## 2012-02-15 DIAGNOSIS — F411 Generalized anxiety disorder: Secondary | ICD-10-CM

## 2012-02-15 DIAGNOSIS — F329 Major depressive disorder, single episode, unspecified: Secondary | ICD-10-CM

## 2012-02-15 MED ORDER — VENLAFAXINE HCL ER 75 MG PO CP24: 225.0000 mg | ORAL_CAPSULE | Freq: Every day | ORAL | Status: DC

## 2012-02-15 MED ORDER — LAMOTRIGINE 100 MG PO TABS: 100.0000 mg | ORAL_TABLET | Freq: Every day | ORAL | Status: DC

## 2012-02-15 NOTE — Progress Notes (Signed)
   [facility] Follow-up Outpatient Visit  Brooke Kaiser 17-Jan-1971   Subjective: The patient is a 42 year old female who has been followed by [facility] since October of 2011. She is currently diagnosed with generalized anxiety disorder along with Maj. depressive disorder. She has been stable on Lamictal, Xanax, and Effexor XR. At her last appointment, I did not make any changes. I have not seen her since July. She presents today. She has been financially strapped. She reports that she overextended herself. She brought her house, and her truck. She is up to the roof on the house. She owes payments on all 3. She is approximately $200 behind monthly. Her sister about her $200 worth of oil which is helped. Her anxiety level has been increased because of financial issues. The patient continues to work 2 jobs. She is back with her girlfriend. They decided they need to communicate better. The patient may be restarting her Xanax. I have not fill it since July, and she has not used more than one bottle since then. There has been no cutting. She feels that emotionally, she is in a good place. She was she was more responsible financially. She has cut back on multiple resources such as turning off her cable. She is saving money where she can.  Filed Vitals:   02/15/12 1428  BP: 110/72    Mental Status Examination  Appearance: Casually dressed Alert: Yes Attention: good  Cooperative: Yes Eye Contact: Good Speech: Regular rate rhythm and volume Psychomotor Activity: Normal Memory/Concentration: Intact Oriented: person, place, time/date and situation Mood: Anxious Affect: Appropriate Thought Processes and Associations: Logical Fund of Knowledge: Fair Thought Content: No suicidal or homicidal thoughts Insight: Fair Judgement: Fair  Diagnosis: Generalized anxiety disorder, Maj. depressive disorder, recurrent, moderate  Treatment Plan: I will not make any changes  today. Patient may takes an axis necessary. I will see her back in 2 months. Patient may call with concerns.  Jamse Mead, MD

## 2012-02-17 ENCOUNTER — Other Ambulatory Visit (HOSPITAL_COMMUNITY): Payer: Self-pay | Admitting: Psychiatry

## 2012-02-17 ENCOUNTER — Other Ambulatory Visit: Payer: Self-pay | Admitting: Family Medicine

## 2012-02-20 ENCOUNTER — Other Ambulatory Visit (HOSPITAL_COMMUNITY): Payer: Self-pay | Admitting: Psychiatry

## 2012-02-20 ENCOUNTER — Other Ambulatory Visit: Payer: Self-pay | Admitting: Family Medicine

## 2012-02-20 DIAGNOSIS — F329 Major depressive disorder, single episode, unspecified: Secondary | ICD-10-CM

## 2012-02-20 DIAGNOSIS — F411 Generalized anxiety disorder: Secondary | ICD-10-CM

## 2012-02-20 MED ORDER — LAMOTRIGINE 100 MG PO TABS: 100.0000 mg | ORAL_TABLET | Freq: Every day | ORAL | Status: DC

## 2012-03-17 ENCOUNTER — Ambulatory Visit (INDEPENDENT_AMBULATORY_CARE_PROVIDER_SITE_OTHER): Payer: 59 | Admitting: Family Medicine

## 2012-03-17 ENCOUNTER — Ambulatory Visit (INDEPENDENT_AMBULATORY_CARE_PROVIDER_SITE_OTHER): Payer: 59

## 2012-03-17 ENCOUNTER — Encounter: Payer: Self-pay | Admitting: Family Medicine

## 2012-03-17 VITALS — BP 143/82 | HR 91 | Ht 68.6 in | Wt 252.0 lb

## 2012-03-17 DIAGNOSIS — M545 Low back pain, unspecified: Secondary | ICD-10-CM

## 2012-03-17 DIAGNOSIS — R209 Unspecified disturbances of skin sensation: Secondary | ICD-10-CM

## 2012-03-17 DIAGNOSIS — R202 Paresthesia of skin: Secondary | ICD-10-CM

## 2012-03-17 DIAGNOSIS — I10 Essential (primary) hypertension: Secondary | ICD-10-CM

## 2012-03-17 DIAGNOSIS — M2550 Pain in unspecified joint: Secondary | ICD-10-CM

## 2012-03-17 LAB — CBC WITH DIFFERENTIAL/PLATELET
Eosinophils Relative: 3 % (ref 0–5)
HCT: 36.8 % (ref 36.0–46.0)
Lymphocytes Relative: 28 % (ref 12–46)
Lymphs Abs: 2.8 10*3/uL (ref 0.7–4.0)
MCH: 26.2 pg (ref 26.0–34.0)
MCV: 79 fL (ref 78.0–100.0)
Monocytes Absolute: 1 10*3/uL (ref 0.1–1.0)
RBC: 4.66 MIL/uL (ref 3.87–5.11)
WBC: 10.2 10*3/uL (ref 4.0–10.5)

## 2012-03-17 LAB — COMPLETE METABOLIC PANEL WITH GFR
ALT: 19 U/L (ref 0–35)
BUN: 8 mg/dL (ref 6–23)
CO2: 25 mEq/L (ref 19–32)
Calcium: 8.9 mg/dL (ref 8.4–10.5)
Chloride: 104 mEq/L (ref 96–112)
Creat: 0.69 mg/dL (ref 0.50–1.10)
GFR, Est African American: >89 mL/min
GFR, Est Non African American: >89 mL/min
Glucose, Bld: 102 mg/dL — ABNORMAL HIGH (ref 70–99)

## 2012-03-17 LAB — RHEUMATOID FACTOR: Rhuematoid fact SerPl-aCnc: <10 IU/mL (ref <=14)

## 2012-03-17 LAB — TSH: TSH: 2.34 u[IU]/mL (ref 0.350–4.500)

## 2012-03-17 MED ORDER — LOSARTAN POTASSIUM-HCTZ 100-25 MG PO TABS: 1.0000 | ORAL_TABLET | Freq: Every day | ORAL | Status: DC

## 2012-03-17 NOTE — Progress Notes (Signed)
Subjective:    Patient ID: Brooke Kaiser, female    DOB: [birth year], 42 y.o.   MRN: [id number]  HPI Bodyaches x one year does not happen all the time has progressively gotten worse no particular time that it gets worse. more so in hands,knees,wrists ankles sharp burning pain. Has a lot of low back pain as well. Can last up to a week when flares. Does use IBU and sometime help. No swelling in hands abut sometimes in the feet. No fam hx of RA.  Has noticed a nodule on dorsum of PIP middle finger on the left hand.  Does get a lot of pain in hte base of her thumbs.  No fever.    Low back pain for years. Has been getting worse. Has gained some weight. No regular exercise. Says morning is worse for her.  Worse if working over a low tabel at [employer] at work.  Sweeping and mopping hurts as well. Ibuprofen does help sometimes. She says hematocrit and doesn't work she'll usually take the Flexeril and that does seem to help. No numbness or tingling shooting down into the legs. No radiation of pain. It's mostly bilateral Mueller back.    Review of Systems Occ HA and neck pain. Says stretches do help.   BP 143/82  Pulse 91  Ht 5' 8.6" (1.742 m)  Wt 252 lb (114.306 kg)  BMI 37.67 kg/m2  LMP 03/10/2012    Allergies  Allergen Reactions  . Amoxicillin-Pot Clavulanate     REACTION: N\T\V  . Codeine     Past Medical History  Diagnosis Date  . Abnormal thyroid blood test   . Hypertension   . Anemia   . Depression   . Anxiety     Past Surgical History  Procedure Laterality Date  . 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 Marital Status: Single    Spouse Name: N/A    Number of Children: N/A  . Years of Education: N/A   Occupational History  . Not on file.   Social History Main Topics  . Smoking status: Current Every Day Smoker -- 1.00 packs/day  . Smokeless tobacco: Not on file  . Alcohol Use: Yes  . Drug Use: No  .  Sexually Active: Not on file     Comment: prefers female sex partner   Other Topics Concern  . Not on file   Social History Narrative  . No narrative on file    Family History  Problem Relation Age of Onset  . Cancer Mother 62    Breast  . Colon polyps Mother   . Other Mother     colon polyps  . Alcohol abuse Father   . Depression Father   . Suicidality Father     commited suicide  . Colon polyps Maternal Aunt     cancerous colon polyps  . Cancer Maternal Uncle     cancerous colon polyps  . Heart disease Other   . Other Other     thyroid disorder    Outpatient Encounter Prescriptions as of 03/17/2012  Medication Sig Dispense Refill  . albuterol (PROAIR HFA) 108 (90 BASE) MCG/ACT inhaler Inhale 4 puffs into the lungs 2 (two) times daily as needed. 2-4 puffs inhaled bid prn  8.5 g  2  . ALPRAZolam (XANAX) 0.5 MG tablet Take 1 tablet (0.5 mg total) by mouth daily as needed. For anxiety  30 tablet  2  . beclomethasone (QVAR) 80 MCG/ACT inhaler Inhale 2 puffs into the lungs 2 (two) times daily.  1 Inhaler  1  . cyclobenzaprine (FLEXERIL) 10 MG tablet TAKE 1 TABLET BY MOUTH EVERY 8 HOURS AS NEEDED FOR MUSCLE SPASMS  15 tablet  0  . DEXILANT 60 MG capsule TAKE ONE CAPSULE BY MOUTH EVERY DAY  30 capsule  2  . lamoTRIgine (LAMICTAL) 100 MG tablet Take 1 tablet (100 mg total) by mouth daily.  30 tablet  5  . losartan-hydrochlorothiazide (HYZAAR) 100-25 MG per tablet Take 1 tablet by mouth daily.  30 tablet  2  . venlafaxine XR (EFFEXOR-XR) 75 MG 24 hr capsule Take 3 capsules (225 mg total) by mouth daily.  90 capsule  5  . [DISCONTINUED] DEXILANT 60 MG capsule TAKE ONE CAPSULE BY MOUTH EVERY DAY  30 capsule  2  . [DISCONTINUED] Hydrocod Polst-Chlorphen Polst (TUSSICAPS) 10-8 MG CP12 Take 1 tablet by mouth 3 (three) times daily as needed (cough). Will cause drowsiness.  30 each  0  . [DISCONTINUED] losartan (COZAAR) 100 MG tablet TAKE 1 TABLET BY MOUTH EVERY DAY  30 tablet  5  .  [DISCONTINUED] venlafaxine XR (EFFEXOR-XR) 75 MG 24 hr capsule TAKE 3 CAPSULES BY MOUTH EVERY DAY  90 capsule  5  . [DISCONTINUED] venlafaxine XR (EFFEXOR-XR) 75 MG 24 hr capsule TAKE 3 CAPSULES BY MOUTH EVERY DAY  90 capsule  5   No facility-administered encounter medications on file as of 03/17/2012.          Objective:   Physical Exam  Constitutional: She is oriented to person, place, and time. She appears well-developed and well-nourished.  HENT:  Head: Normocephalic and atraumatic.  Cardiovascular: Normal rate, regular rhythm and normal heart sounds.   Pulmonary/Chest: Effort normal and breath sounds normal.  Musculoskeletal:  Normal lumbar flexion, extension, rotation right and left, side bending. She is nontender over the lumbar spine it is tender over the bilateral SI joints. Negative straight leg raise. Hip, knee, ankle strength is 5 out of 5 bilaterally. Patellar reflexes 2+.  Neurological: She is alert and oriented to person, place, and time.  Skin: Skin is warm and dry.  Psychiatric: She has a normal mood and affect. Her behavior is normal.          Assessment & Plan:  Polyarthralgia - Suspect OA. The, we'll check lab work to evaluate further for possible autoimmune disorder such as rheumatoid arthritis. She has no significant swelling or edema on exam today. We'll check uric acid to evaluate for gout that her pain does not sound consistent with actual gout flares. We discussed working on weight loss in addition to cutting back on artificial sweeteners.  HTN- Uncontrolled.  Will add hctz to her losartan for better control. Followup in one month to recheck blood pressure. Check BMP at that time to make sure the potassium and kidney function are stable.  Low Back Pain - chronic, she mostly uses ibuprofen and occasional Flexeril. He would like to get an x-ray today since she has not had this done in years. She denies any sciatic-type symptoms.  We will call with results once  available. Suspect that she will likely benefit from physical therapy for more low back MSK strain.

## 2012-03-18 ENCOUNTER — Other Ambulatory Visit: Payer: Self-pay | Admitting: Family Medicine

## 2012-03-18 DIAGNOSIS — M545 Low back pain, unspecified: Secondary | ICD-10-CM

## 2012-03-18 LAB — ANA: Anti Nuclear Antibody(ANA): NEGATIVE

## 2012-03-28 ENCOUNTER — Emergency Department (INDEPENDENT_AMBULATORY_CARE_PROVIDER_SITE_OTHER): Payer: 59

## 2012-03-28 ENCOUNTER — Emergency Department
Admission: EM | Admit: 2012-03-28 | Discharge: 2012-03-28 | Disposition: A | Discharge status: Home / Self Care | Payer: 59 | Source: Home / Self Care | Attending: Family Medicine | Admitting: Family Medicine

## 2012-03-28 ENCOUNTER — Encounter: Payer: Self-pay | Admitting: *Deleted

## 2012-03-28 DIAGNOSIS — R0781 Pleurodynia: Secondary | ICD-10-CM

## 2012-03-28 DIAGNOSIS — J209 Acute bronchitis, unspecified: Secondary | ICD-10-CM

## 2012-03-28 DIAGNOSIS — R079 Chest pain, unspecified: Secondary | ICD-10-CM

## 2012-03-28 MED ORDER — PREDNISONE 20 MG PO TABS: 20.0000 mg | ORAL_TABLET | Freq: Two times a day (BID) | ORAL | Status: DC

## 2012-03-28 MED ORDER — BENZONATATE 200 MG PO CAPS: 200.0000 mg | ORAL_CAPSULE | Freq: Every day | ORAL | Status: DC

## 2012-03-28 MED ORDER — DOXYCYCLINE HYCLATE 100 MG PO CAPS: 100.0000 mg | ORAL_CAPSULE | Freq: Two times a day (BID) | ORAL | Status: DC

## 2012-03-28 MED ORDER — HYDROCODONE-ACETAMINOPHEN 5-325 MG PO TABS: 1.0000 | ORAL_TABLET | Freq: Four times a day (QID) | ORAL | Status: DC | PRN

## 2012-03-28 NOTE — ED Notes (Signed)
Brooke Kaiser c/o cough and sinus drainage. This AM during a "coughing spell" she felt a pop on her left side. She now has constant pain in her left side.

## 2012-03-28 NOTE — ED Provider Notes (Signed)
History     CSN: [id number]  Arrival date & time 03/28/12  1191   First MD Initiated Contact with Patient 03/28/12 [phone]      Chief Complaint  Patient presents with  . Cough  . Rib Pain        HPI Comments: Patient complains of developing a cold about 3 to 4 weeks ago with initial sore throat, sinus congestion, and cough.  The cough has persisted and gradually become worse over the past week.  She has been treated for asthma in the past, and has resumed use of her albuterol inhaler.  This morning while coughing, she developed a sudden sharp pain in her left lateral chest and upper lateral abdomen that has persisted.  No shortness of breath.  She often coughs until she gags.  Her Tdap is current.                                                                                                                                                                        Patient is a 42 y.o. female presenting with cough. The history is provided by the patient.  Cough Cough characteristics:  Productive and paroxysmal Sputum characteristics:  White Severity:  Moderate Onset quality:  Gradual Duration:  4 weeks Timing:  Intermittent Progression:  Worsening Chronicity:  New Smoker: yes   Context: upper respiratory infection   Relieved by:  Beta-agonist inhaler Worsened by:  Activity Associated symptoms: ear pain and wheezing   Associated symptoms: no chest pain, no chills, no diaphoresis, no ear fullness, no eye discharge, no fever, no headaches, no myalgias, no rash, no rhinorrhea, no shortness of breath, no sinus congestion and no sore throat     Past Medical History  Diagnosis Date  . Abnormal thyroid blood test   . Hypertension   . Anemia   . Depression   . Anxiety     Past Surgical History  Procedure Laterality Date  . 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 Cancer Mother 59    Breast  . Colon polyps  Mother   . Other Mother     colon polyps  . Alcohol abuse Father   . Depression Father   . Suicidality Father     commited suicide  . Colon polyps Maternal Aunt     cancerous colon polyps  . Cancer Maternal Uncle     cancerous colon polyps  . Heart disease Other   . Other Other     thyroid disorder    History  Substance Use Topics  . Smoking status: Current Every Day Smoker -- 1.00 packs/day  . Smokeless tobacco:  Never Used  . Alcohol Use: Yes    OB History   Grav Para Term Preterm Abortions TAB SAB Ect Mult Living                  Review of Systems  Constitutional: Negative for fever, chills and diaphoresis.  HENT: Positive for ear pain. Negative for sore throat and rhinorrhea.   Eyes: Negative for discharge.  Respiratory: Positive for cough and wheezing. Negative for shortness of breath.   Cardiovascular: Negative for chest pain.  Musculoskeletal: Negative for myalgias.  Skin: Negative for rash.  Neurological: Negative for headaches.    Allergies  Amoxicillin-pot clavulanate and Codeine  Home Medications   Current Outpatient Rx  Name  Route  Sig  Dispense  Refill  . albuterol (PROAIR HFA) 108 (90 BASE) MCG/ACT inhaler   Inhalation   Inhale 4 puffs into the lungs 2 (two) times daily as needed. 2-4 puffs inhaled bid prn   8.5 g   2   . ALPRAZolam (XANAX) 0.5 MG tablet   Oral   Take 1 tablet (0.5 mg total) by mouth daily as needed. For anxiety   30 tablet   2   . beclomethasone (QVAR) 80 MCG/ACT inhaler   Inhalation   Inhale 2 puffs into the lungs 2 (two) times daily.   1 Inhaler   1   . benzonatate (TESSALON) 200 MG capsule   Oral   Take 1 capsule (200 mg total) by mouth at bedtime.   12 capsule   0   . cyclobenzaprine (FLEXERIL) 10 MG tablet      TAKE 1 TABLET BY MOUTH EVERY 8 HOURS AS NEEDED FOR MUSCLE SPASMS   15 tablet   0     THANK YOU   . DEXILANT 60 MG capsule      TAKE ONE CAPSULE BY MOUTH EVERY DAY   30 capsule   2   .  doxycycline (VIBRAMYCIN) 100 MG capsule   Oral   Take 1 capsule (100 mg total) by mouth 2 (two) times daily.   20 capsule   0   . HYDROcodone-acetaminophen (NORCO/VICODIN) 5-325 MG per tablet   Oral   Take 1 tablet by mouth every 6 (six) hours as needed for pain.   15 tablet   0   . lamoTRIgine (LAMICTAL) 100 MG tablet   Oral   Take 1 tablet (100 mg total) by mouth daily.   30 tablet   5     This was sent 02/15/12 also.   . losartan-hydrochlorothiazide (HYZAAR) 100-25 MG per tablet   Oral   Take 1 tablet by mouth daily.   30 tablet   2   . predniSONE (DELTASONE) 20 MG tablet   Oral   Take 1 tablet (20 mg total) by mouth 2 (two) times daily. Take with food.   10 tablet   0   . venlafaxine XR (EFFEXOR-XR) 75 MG 24 hr capsule   Oral   Take 3 capsules (225 mg total) by mouth daily.   90 capsule   5     BP 136/85  Pulse 81  Temp(Src) 98.1 F (36.7 C) (Oral)  Ht 5' 8.5" (1.74 m)  Wt 255 lb (115.667 kg)  BMI 38.2 kg/m2  SpO2 98%  LMP 03/10/2012  Physical Exam  Pulmonary/Chest:      There is tenderness left lateral chest over both ribs and intercostal spaces as noted on diagram.    Nursing notes and Vital Signs reviewed.  Appearance:  Patient appears stated age, and in no acute distress.  Patient is obese (BMI 38.2) Eyes:  Pupils are equal, round, and reactive to light and accomodation.  Extraocular movement is intact.  Conjunctivae are not inflamed  Ears:  Canals normal.  Tympanic membranes normal.  Nose:  Mildly congested turbinates.  No sinus tenderness.  Pharynx:  Normal Neck:  Supple.  Slightly tender shotty posterior nodes are palpated bilaterally  Lungs:  Clear to auscultation.  Breath sounds are equal.  Heart:  Regular rate and rhythm without murmurs, rubs, or gallops.  Abdomen:  There is tenderness left lateral abdomen extending to costal margin without masses or hepatosplenomegaly.  Bowel sounds are present.  No CVA tenderness.  Extremities:  No  edema.  No calf tenderness Skin:  No rash present.   ED Course  Procedures  none   Dg Ribs Unilateral W/chest Left  03/28/2012  *RADIOLOGY REPORT*  Clinical Data: Pain after coughing, smoking history  LEFT RIBS AND CHEST - 3+ VIEW  Comparison: Chest and right rib detail of 09/04/2010  Findings: No active infiltrate or effusion is seen.  Mediastinal contours are stable.  Surgical clips overlie the aortic knob.  Left rib detail films show no acute left rib fracture.  IMPRESSION:  1.  No active lung disease. 2.  Negative left rib detail.   Original Report Authenticated By: Dwyane Dee, M.D.      1. Rib pain on left side   2. Acute bronchitis       MDM  Rib belt applied. Begin doxycycline and prednisone burst.  Prescription written for Benzonatate (Tessalon) to take at bedtime for night-time cough.  Take Mucinex, or Mucinex D (guaifenesin with decongestant) twice daily for congestion.  Increase fluid intake, rest. May use Afrin nasal spray (or generic oxymetazoline) twice daily for about 5 days.  Also recommend using saline nasal spray several times daily and saline nasal irrigation (AYR is a common brand) Stop all antihistamines for now, and other non-prescription cough/cold preparations. Continue albuterol inhaler. Follow-up with family doctor if not improving 7 to 10 days.         Lattie Haw, MD 03/28/12 1332

## 2012-04-09 ENCOUNTER — Encounter (INDEPENDENT_AMBULATORY_CARE_PROVIDER_SITE_OTHER): Payer: Self-pay | Admitting: General Surgery

## 2012-04-14 ENCOUNTER — Ambulatory Visit: Payer: 59 | Admitting: Family Medicine

## 2012-04-18 ENCOUNTER — Ambulatory Visit (INDEPENDENT_AMBULATORY_CARE_PROVIDER_SITE_OTHER): Payer: 59 | Admitting: Family Medicine

## 2012-04-18 ENCOUNTER — Encounter: Payer: Self-pay | Admitting: Family Medicine

## 2012-04-18 ENCOUNTER — Encounter (HOSPITAL_COMMUNITY): Payer: Self-pay | Admitting: Psychiatry

## 2012-04-18 ENCOUNTER — Ambulatory Visit (INDEPENDENT_AMBULATORY_CARE_PROVIDER_SITE_OTHER): Payer: 59 | Admitting: Psychiatry

## 2012-04-18 VITALS — BP 132/69 | HR 92 | Ht 68.6 in | Wt 258.0 lb

## 2012-04-18 VITALS — BP 122/75 | Ht 68.5 in | Wt 258.0 lb

## 2012-04-18 DIAGNOSIS — I10 Essential (primary) hypertension: Secondary | ICD-10-CM

## 2012-04-18 DIAGNOSIS — J019 Acute sinusitis, unspecified: Secondary | ICD-10-CM

## 2012-04-18 DIAGNOSIS — J209 Acute bronchitis, unspecified: Secondary | ICD-10-CM

## 2012-04-18 DIAGNOSIS — F331 Major depressive disorder, recurrent, moderate: Secondary | ICD-10-CM

## 2012-04-18 DIAGNOSIS — R0781 Pleurodynia: Secondary | ICD-10-CM

## 2012-04-18 DIAGNOSIS — F329 Major depressive disorder, single episode, unspecified: Secondary | ICD-10-CM

## 2012-04-18 DIAGNOSIS — R0789 Other chest pain: Secondary | ICD-10-CM

## 2012-04-18 DIAGNOSIS — F411 Generalized anxiety disorder: Secondary | ICD-10-CM

## 2012-04-18 DIAGNOSIS — R079 Chest pain, unspecified: Secondary | ICD-10-CM

## 2012-04-18 MED ORDER — BENZONATATE 200 MG PO CAPS: 200.0000 mg | ORAL_CAPSULE | Freq: Every day | ORAL | Status: DC

## 2012-04-18 MED ORDER — CYCLOBENZAPRINE HCL 10 MG PO TABS: 5.0000 mg | ORAL_TABLET | Freq: Every evening | ORAL | Status: DC | PRN

## 2012-04-18 MED ORDER — AZITHROMYCIN 250 MG PO TABS: ORAL_TABLET | ORAL | Status: DC

## 2012-04-18 NOTE — Progress Notes (Signed)
   [facility] Follow-up Outpatient Visit  Brooke Kaiser 01/03/1971   Subjective: The patient is a 42 year old female who has been followed by [facility] since October of 2011. She is currently diagnosed with generalized anxiety disorder along with Maj. depressive disorder. She has been stable on Lamictal, Xanax, and Effexor XR. At her last appointment, I did not make any changes except to advise her to use the Xanax as needed. She has not been using it for several months. The patient presents today. She has just come from seeing her primary care physician. Her blood pressure had been elevated at her PCPs office. Her blood pressure medication has been changed. The patient continues to be financially strapped. She plans on selling her truck if needed. She has a list of financial priorities that she is made. She's approximately 2 weeks behind in bills. She reports that 2 weeks ago she felt on edge with less filter. She started using Xanax as needed. This is made a positive difference. She is still with her girlfriend Tresa Endo. The patient has had back issues. An x-ray was positive for scoliosis. The patient cannot afford physical therapy, so is been doing exercises at home. I have recommended yoga. She would like to lose weight and quit smoking.  Filed Vitals:   04/18/12 1431  BP: 122/75    Mental Status Examination  Appearance: Casually dressed Alert: Yes Attention: good  Cooperative: Yes Eye Contact: Good Speech: Regular rate rhythm and volume Psychomotor Activity: Normal Memory/Concentration: Intact Oriented: person, place, time/date and situation Mood: Anxious Affect: Appropriate Thought Processes and Associations: Logical Fund of Knowledge: Fair Thought Content: No suicidal or homicidal thoughts Insight: Fair Judgement: Fair  Diagnosis: Generalized anxiety disorder, Maj. depressive disorder, recurrent, moderate  Treatment Plan: I will not make any  changes today. Patient may take Xanax necessary. I will see her back in 3 months. She plans to weight 240 pounds at that time. Patient may call with concerns.  Jamse Mead, MD

## 2012-04-18 NOTE — Progress Notes (Signed)
  Subjective:    Patient ID: Brooke Kaiser, female    DOB: 1970/02/02, 42 y.o.   MRN: [id number]  HPI Had bronchitis about 3 weeks ago and had left rib pain. Did have neg xrays but it is 50% better. She is not taking any pain relievers.  Worse with certain movements or if hits it by sccident.  Still smoking.  Still has some cough. Says only took 4 days of the doxycycline. Says just kept forgetting to take it. Still having a lot of phelgm.  No fever.  Using her inhaler some at work.  Mild nasal congestion but does have a lot of phlegm. Says the bronchitis is only 25% better.    HTN-  Pt denies chest pain, SOB, dizziness, or heart palpitations.  Taking meds as directed w/o problems.  Denies medication side effects.    Review of Systems     Objective:   Physical Exam  Constitutional: She is oriented to person, place, and time. She appears well-developed and well-nourished.  HENT:  Head: Normocephalic and atraumatic.  Right Ear: External ear normal.  Left Ear: External ear normal.  Nose: Nose normal.  Mouth/Throat: Oropharynx is clear and moist.  TMs and canals are clear.   Eyes: Conjunctivae and EOM are normal. Pupils are equal, round, and reactive to light.  Neck: Neck supple. No thyromegaly present.  Cardiovascular: Normal rate, regular rhythm and normal heart sounds.   Pulmonary/Chest: Effort normal and breath sounds normal. She has no wheezes.  Lymphadenopathy:    She has no cervical adenopathy.  Neurological: She is alert and oriented to person, place, and time.  Skin: Skin is warm and dry.  Psychiatric: She has a normal mood and affect.          Assessment & Plan:  Bronchitis/Sinusitis- only 25% better.  Will retreat with zpack. Says she didn't feel good on the doxy and only tookf or 4 days. Call if not better in one week.   Left rib pain - 50% better. Give it 3-4 more weeks to completely resolve.  Lung exam is clear today. Stil milldly tender over the left lateral ribs.     Tob abuse - recommend cessation.  Says she is thinking about trying the e-cig.   HTN - well controlle.d F/u in 6 mo.  Work on weight loss, diet aqnd exercise. She says she will try.

## 2012-06-11 ENCOUNTER — Other Ambulatory Visit: Payer: Self-pay | Admitting: Family Medicine

## 2012-06-20 ENCOUNTER — Other Ambulatory Visit: Payer: Self-pay | Admitting: Family Medicine

## 2012-06-24 ENCOUNTER — Encounter: Payer: Self-pay | Admitting: Family Medicine

## 2012-06-24 ENCOUNTER — Ambulatory Visit (INDEPENDENT_AMBULATORY_CARE_PROVIDER_SITE_OTHER): Payer: 59 | Admitting: Family Medicine

## 2012-06-24 VITALS — BP 133/69 | HR 85 | Wt 256.0 lb

## 2012-06-24 DIAGNOSIS — M5430 Sciatica, unspecified side: Secondary | ICD-10-CM

## 2012-06-24 DIAGNOSIS — R7309 Other abnormal glucose: Secondary | ICD-10-CM

## 2012-06-24 DIAGNOSIS — R209 Unspecified disturbances of skin sensation: Secondary | ICD-10-CM

## 2012-06-24 DIAGNOSIS — M545 Low back pain, unspecified: Secondary | ICD-10-CM

## 2012-06-24 DIAGNOSIS — F17200 Nicotine dependence, unspecified, uncomplicated: Secondary | ICD-10-CM

## 2012-06-24 DIAGNOSIS — R7301 Impaired fasting glucose: Secondary | ICD-10-CM

## 2012-06-24 DIAGNOSIS — G5600 Carpal tunnel syndrome, unspecified upper limb: Secondary | ICD-10-CM

## 2012-06-24 DIAGNOSIS — E1165 Type 2 diabetes mellitus with hyperglycemia: Secondary | ICD-10-CM | POA: Insufficient documentation

## 2012-06-24 LAB — POCT GLYCOSYLATED HEMOGLOBIN (HGB A1C): Hemoglobin A1C: 6.3

## 2012-06-24 MED ORDER — TRAMADOL HCL 50 MG PO TABS: 50.0000 mg | ORAL_TABLET | Freq: Three times a day (TID) | ORAL | Status: DC | PRN

## 2012-06-24 NOTE — Patient Instructions (Addendum)
Start Vitamin D 2000 IU daily Then recheck your levels in 6-8 weeks. Carpal Tunnel Syndrome The carpal tunnel is a narrow area located on the palm side of your wrist. The tunnel is formed by the wrist bones and ligaments. Nerves, blood vessels, and tendons pass through the carpal tunnel. Repeated wrist motion or certain diseases may cause swelling within the tunnel. This swelling pinches the main nerve in the wrist (median nerve) and causes the painful hand and arm condition called carpal tunnel syndrome. CAUSES   Repeated wrist motions.  Wrist injuries.  Certain diseases like arthritis, diabetes, alcoholism, hyperthyroidism, and kidney failure.  Obesity.  Pregnancy. SYMPTOMS   A "pins and needles" feeling in your fingers or hand.  Tingling or numbness in your fingers or hand.  An aching feeling in your entire arm.  Wrist pain that goes up your arm to your shoulder.  Pain that goes down into your palm or fingers.  A weak feeling in your hands. DIAGNOSIS  Your caregiver will take your history and perform a physical exam. An electromyography test may be needed. This test measures electrical signals sent out by the muscles. The electrical signals are usually slowed by carpal tunnel syndrome. You may also need X-rays. TREATMENT  Carpal tunnel syndrome may clear up by itself. Your caregiver may recommend a wrist splint or medicine such as a nonsteroidal anti-inflammatory medicine. Cortisone injections may help. Sometimes, surgery may be needed to free the pinched nerve.  HOME CARE INSTRUCTIONS   Take all medicine as directed by your caregiver. Only take over-the-counter or prescription medicines for pain, discomfort, or fever as directed by your caregiver.  If you were given a splint to keep your wrist from bending, wear it as directed. It is important to wear the splint at night. Wear the splint for as long as you have pain or numbness in your hand, arm, or wrist. This may take 1 to 2  months.  Rest your wrist from any activity that may be causing your pain. If your symptoms are work-related, you may need to talk to your employer about changing to a job that does not require using your wrist.  Put ice on your wrist after long periods of wrist activity.  Put ice in a plastic bag.  Place a towel between your skin and the bag.  Leave the ice on for 15-20 minutes, 3-4 times a day.  Keep all follow-up visits as directed by your caregiver. This includes any orthopedic referrals, physical therapy, and rehabilitation. Any delay in getting necessary care could result in a delay or failure of your condition to heal. SEEK IMMEDIATE MEDICAL CARE IF:   You have new, unexplained symptoms.  Your symptoms get worse and are not helped or controlled with medicines. MAKE SURE YOU:   Understand these instructions.  Will watch your condition.  Will get help right away if you are not doing well or get worse. Document Released: 01/13/2000 Document Revised: 04/09/2011 Document Reviewed: 12/01/2010 [facility] Patient Information 2014 [place], [place]. Smoking Cessation, Tips for Success YOU CAN QUIT SMOKING If you are ready to quit smoking, congratulations! You have chosen to help yourself be healthier. Cigarettes bring nicotine, tar, carbon monoxide, and other irritants into your body. Your lungs, heart, and blood vessels will be able to work better without these poisons. There are many different ways to quit smoking. Nicotine gum, nicotine patches, a nicotine inhaler, or nicotine nasal spray can help with physical craving. Hypnosis, support groups, and medicines help break  the habit of smoking. Here are some tips to help you quit for good.  Throw away all cigarettes.  Clean and remove all ashtrays from your home, work, and car.  On a card, write down your reasons for quitting. Carry the card with you and read it when you get the urge to smoke.  Cleanse your body of nicotine. Drink  enough water and fluids to keep your urine clear or pale yellow. Do this after quitting to flush the nicotine from your body.  Learn to predict your moods. Do not let a bad situation be your excuse to have a cigarette. Some situations in your life might tempt you into wanting a cigarette.  Never have "just one" cigarette. It leads to wanting another and another. Remind yourself of your decision to quit.  Change habits associated with smoking. If you smoked while driving or when feeling stressed, try other activities to replace smoking. Stand up when drinking your coffee. Brush your teeth after eating. Sit in a different chair when you read the paper. Avoid alcohol while trying to quit, and try to drink fewer caffeinated beverages. Alcohol and caffeine may urge you to smoke.  Avoid foods and drinks that can trigger a desire to smoke, such as sugary or spicy foods and alcohol.  Ask people who smoke not to smoke around you.  Have something planned to do right after eating or having a cup of coffee. Take a walk or exercise to perk you up. This will help to keep you from overeating.  Try a relaxation exercise to calm you down and decrease your stress. Remember, you may be tense and nervous for the first 2 weeks after you quit, but this will pass.  Find new activities to keep your hands busy. Play with a pen, coin, or rubber band. Doodle or draw things on paper.  Brush your teeth right after eating. This will help cut down on the craving for the taste of tobacco after meals. You can try mouthwash, too.  Use oral substitutes, such as lemon drops, carrots, a cinnamon stick, or chewing gum, in place of cigarettes. Keep them handy so they are available when you have the urge to smoke.  When you have the urge to smoke, try deep breathing.  Designate your home as a nonsmoking area.  If you are a heavy smoker, ask your caregiver about a prescription for nicotine chewing gum. It can ease your withdrawal  from nicotine.  Reward yourself. Set aside the cigarette money you save and buy yourself something nice.  Look for support from others. Join a support group or smoking cessation program. Ask someone at home or at work to help you with your plan to quit smoking.  Always ask yourself, "Do I need this cigarette or is this just a reflex?" Tell yourself, "Today, I choose not to smoke," or "I do not want to smoke." You are reminding yourself of your decision to quit, even if you do smoke a cigarette. HOW WILL I FEEL WHEN I QUIT SMOKING?  The benefits of not smoking start within days of quitting.  You may have symptoms of withdrawal because your body is used to nicotine (the addictive substance in cigarettes). You may crave cigarettes, be irritable, feel very hungry, cough often, get headaches, or have difficulty concentrating.  The withdrawal symptoms are only temporary. They are strongest when you first quit but will go away within 10 to 14 days.  When withdrawal symptoms occur, stay in control. Think  about your reasons for quitting. Remind yourself that these are signs that your body is healing and getting used to being without cigarettes.  Remember that withdrawal symptoms are easier to treat than the major diseases that smoking can cause.  Even after the withdrawal is over, expect periodic urges to smoke. However, these cravings are generally short-lived and will go away whether you smoke or not. Do not smoke!  If you relapse and smoke again, do not lose hope. Most smokers quit 3 times before they are successful.  If you relapse, do not give up! Plan ahead and think about what you will do the next time you get the urge to smoke. LIFE AS A NONSMOKER: MAKE IT FOR A MONTH, MAKE IT FOR LIFE Day 1: Hang this page where you will see it every day. Day 2: Get rid of all ashtrays, matches, and lighters. Day 3: Drink water. Breathe deeply between sips. Day 4: Avoid places with smoke-filled air, such  as bars, clubs, or the smoking section of restaurants. Day 5: Keep track of how much money you save by not smoking. Day 6: Avoid boredom. Keep a good book with you or go to the movies. Day 7: Reward yourself! One week without smoking! Day 8: Make a dental appointment to get your teeth cleaned. Day 9: Decide how you will turn down a cigarette before it is offered to you. Day 10: Review your reasons for quitting. Day 11: Distract yourself. Stay active to keep your mind off smoking and to relieve tension. Take a walk, exercise, read a book, do a crossword puzzle, or try a new hobby. Day 12: Exercise. Get off the bus before your stop or use stairs instead of escalators. Day 13: Call on friends for support and encouragement. Day 14: Reward yourself! Two weeks without smoking! Day 15: Practice deep breathing exercises. Day 16: Bet a friend that you can stay a nonsmoker. Day 17: Ask to sit in nonsmoking sections of restaurants. Day 18: Hang up "No Smoking" signs. Day 19: Think of yourself as a nonsmoker. Day 20: Each morning, tell yourself you will not smoke. Day 21: Reward yourself! Three weeks without smoking! Day 22: Think of smoking in negative ways. Remember how it stains your teeth, gives you bad breath, and leaves you short of breath. Day 23: Eat a nutritious breakfast. Day 24:Do not relive your days as a smoker. Day 25: Hold a pencil in your hand when talking on the telephone. Day 26: Tell all your friends you do not smoke. Day 27: Think about how much better food tastes. Day 28: Remember, one cigarette is one too many. Day 29: Take up a hobby that will keep your hands busy. Day 30: Congratulations! One month without smoking! Give yourself a big reward. Your caregiver can direct you to community resources or hospitals for support, which may include:  Group support.  Education.  Hypnosis.  Subliminal therapy. Document Released: 10/14/2003 Document Revised: 04/09/2011 Document  Reviewed: 11/01/2008 [facility] Patient Information 2014 [place], [place].

## 2012-06-24 NOTE — Progress Notes (Signed)
  Subjective:    Patient ID: Brooke Kaiser, female    DOB: [birth year], 42 y.o.   MRN: [id number]  HPI Low BAck pain - Seen for this in Feb.  Had xrays done in FEb.  Didn't do chiropractor.  Says has been doing some exercises without improvement.  She feels weak in her back and sometime the muscles feel like they are on fire.  Was taking out the cat litter and was really hard to bend over and pick up the box.  Back started spasming. GEts numbness on the right upper thigh. Can happen at any time.Can come and go for weeks at a time.  Will occ get shooting pain on the outside and inside of her legs. Occ getting numbness in her hands during day and at night.  In her left arm will occ go up to her elbow.  Seems to be getting worse.  Whole hand will go numb.  Will have to shake them in the middle of the night.  Has been using Aleve some.  Works better than Aleve.    Still feeling really fatigued. Having a lot of foot and knee pain.   +smoker, + obese.  No CP.    Review of Systems     Objective:   Physical Exam  Constitutional: She appears well-developed and well-nourished.  HENT:  Head: Normocephalic and atraumatic.  Musculoskeletal:  Wrist with NROM.  Radial pulses 2+ bilat.  Neg tinnels and phalen.  Wrist strength is 5/5. nontender over the elbow.  Lumbar spine with normal flexion/extension.  Normal rotation.  Tender over the right SI joint.  Neg straight leg raise. Hip, knee and ankles strength is 5/5. Patellar reflexes 2+ bilat.   Skin: Skin is warm and dry.  Psychiatric: She has a normal mood and affect. Her behavior is normal.          Assessment & Plan:  Low BAck Pain - Discussed options of PT vs seeing my Partner Dr. Cleora Fleet to discuss tx options.    Hand numbness - think secondary to carpal tunnel syndrome.  Worse on the left. Will fit for wrist splints at bedtime. Continue NSAID. Goiven H.O on stretches as well. Call if not 50% better in 3 weeks.    Tob Abuse - Encouraged  cessation. H.O. Given.   IFG.- will check A!C today . 6.1 today. wokr on wt loss, low carbs. Recheck in 6 months. She has lost 2 lbs which is fantastic.

## 2012-07-14 ENCOUNTER — Ambulatory Visit: Payer: Self-pay | Admitting: Sports Medicine

## 2012-07-15 ENCOUNTER — Ambulatory Visit: Payer: Self-pay | Admitting: Sports Medicine

## 2012-07-15 DIAGNOSIS — Z0289 Encounter for other administrative examinations: Secondary | ICD-10-CM

## 2012-07-21 ENCOUNTER — Encounter (HOSPITAL_COMMUNITY): Payer: Self-pay | Admitting: Psychiatry

## 2012-07-21 ENCOUNTER — Ambulatory Visit (INDEPENDENT_AMBULATORY_CARE_PROVIDER_SITE_OTHER): Payer: 59 | Admitting: Psychiatry

## 2012-07-21 VITALS — BP 122/75 | Ht 68.5 in | Wt 255.0 lb

## 2012-07-21 DIAGNOSIS — F411 Generalized anxiety disorder: Secondary | ICD-10-CM

## 2012-07-21 DIAGNOSIS — F329 Major depressive disorder, single episode, unspecified: Secondary | ICD-10-CM

## 2012-07-21 DIAGNOSIS — F331 Major depressive disorder, recurrent, moderate: Secondary | ICD-10-CM

## 2012-07-21 MED ORDER — ALPRAZOLAM 0.5 MG PO TABS: 0.5000 mg | ORAL_TABLET | Freq: Every day | ORAL | Status: DC | PRN

## 2012-07-21 NOTE — Progress Notes (Signed)
[facility] Follow-up Outpatient Visit  Brooke Kaiser 03/25/1970   Subjective: The patient is a 42 year old female who has been followed by [facility] since October of 2011. She is currently diagnosed with generalized anxiety disorder along with Maj. depressive disorder. She has been stable on Lamictal, Xanax, and Effexor XR. At her last appointment, I did not make any changes. She has not been using it for several months. The patient presents today. She was going to try to be down 18 pounds at this appointment. She is down 3 pounds. She has been to the client and refinance one of her loans and consolidated. She now has to pay $100 less monthly. 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 There were issues with the students, and issues with her girlfriend she worked with. She is now looking for a part-time job for one school starts back. The patient is been out of Lamictal for 2 days. She plans to restart at night. She has had no trouble without it. The patient feels that her mood is been stable. She has has been Xanax but not too much. She continues to smoke. She is still with her girl friend.  Filed Vitals:   07/21/12 1051  BP: 122/75   Active Ambulatory Problems    Diagnosis Date Noted  . HYPERTRIGLYCERIDEMIA 11/04/2007  . TOBACCO ABUSE 07/01/2007  . MDD (major depressive disorder) 07/22/2009  . EXTERNAL HEMORRHOIDS 07/22/2007  . GERD 07/01/2007  . BACK PAIN 07/22/2007  . FATIGUE 11/04/2007  . OTHER ABNORMAL GLUCOSE 05/25/2008  . ABNORMAL THYROID FUNCTION TESTS 04/22/2007  . UNSPECIFIED ANEMIA 03/29/2010  . HYPERTENSION 04/07/2010  . GAD (generalized anxiety disorder) 07/30/2011  . Asthmatic bronchitis 10/16/2011  . IFG (impaired fasting glucose) 06/24/2012   Resolved Ambulatory Problems    Diagnosis Date Noted  . HYPERTENSION, BENIGN 11/14/2009  . CELLULITIS AND ABSCESS OF LEG EXCEPT FOOT 04/07/2010   Past Medical History  Diagnosis  Date  . Abnormal thyroid blood test   . Hypertension   . Anemia   . Depression   . Anxiety    Current Outpatient Prescriptions on File Prior to Visit  Medication Sig Dispense Refill  . albuterol (PROAIR HFA) 108 (90 BASE) MCG/ACT inhaler Inhale 4 puffs into the lungs 2 (two) times daily as needed. 2-4 puffs inhaled bid prn  8.5 g  2  . beclomethasone (QVAR) 80 MCG/ACT inhaler Inhale 2 puffs into the lungs 2 (two) times daily.  1 Inhaler  1  . cyclobenzaprine (FLEXERIL) 10 MG tablet Take 0.5-1 tablets (5-10 mg total) by mouth at bedtime as needed for muscle spasms.  15 tablet  0  . DEXILANT 60 MG capsule TAKE ONE CAPSULE BY MOUTH DAILY  30 capsule  2  . lamoTRIgine (LAMICTAL) 100 MG tablet Take 1 tablet (100 mg total) by mouth daily.  30 tablet  5  . losartan-hydrochlorothiazide (HYZAAR) 100-25 MG per tablet TAKE 1 TABLET BY MOUTH DAILY  30 tablet  2  . traMADol (ULTRAM) 50 MG tablet Take 1 tablet (50 mg total) by mouth every 8 (eight) hours as needed for pain.  60 tablet  0  . venlafaxine XR (EFFEXOR-XR) 75 MG 24 hr capsule Take 3 capsules (225 mg total) by mouth daily.  90 capsule  5   No current facility-administered medications on file prior to visit.   Review of Systems - General ROS: negative for - sleep disturbance or weight gain Psychological ROS: negative for -  anxiety or depression Cardiovascular ROS: no chest pain or dyspnea on exertion Musculoskeletal ROS: negative for - gait disturbance or muscular weakness Neurological ROS: negative for - dizziness, headaches or seizures  Mental Status Examination  Appearance: Casually dressed Alert: Yes Attention: good  Cooperative: Yes Eye Contact: Good Speech: Regular rate rhythm and volume Psychomotor Activity: Normal Memory/Concentration: Intact Oriented: person, place, time/date and situation Mood: Anxious Affect: Appropriate Thought Processes and Associations: Logical Fund of Knowledge: Fair Thought Content: No suicidal or  homicidal thoughts Insight: Fair Judgement: Fair  Diagnosis: Generalized anxiety disorder, Maj. depressive disorder, recurrent, moderate  Treatment Plan: I will not make any changes today. Patient may take Xanax necessary. I will see her back in 3 months. Patient may call with concerns.  Jamse Mead, MD

## 2012-07-22 ENCOUNTER — Ambulatory Visit: Payer: Self-pay | Admitting: Family Medicine

## 2012-07-22 DIAGNOSIS — Z0289 Encounter for other administrative examinations: Secondary | ICD-10-CM

## 2012-08-27 ENCOUNTER — Telehealth: Payer: Self-pay | Admitting: *Deleted

## 2012-08-27 NOTE — Telephone Encounter (Signed)
closed

## 2012-08-27 NOTE — Telephone Encounter (Signed)
Prior auth approved for [employer].  Dates 08/27/12-01/28/38

## 2012-09-02 ENCOUNTER — Telehealth: Payer: Self-pay | Admitting: *Deleted

## 2012-09-02 ENCOUNTER — Encounter: Payer: Self-pay | Admitting: Family Medicine

## 2012-09-02 ENCOUNTER — Ambulatory Visit (INDEPENDENT_AMBULATORY_CARE_PROVIDER_SITE_OTHER): Payer: BC Managed Care – PPO | Admitting: Family Medicine

## 2012-09-02 ENCOUNTER — Other Ambulatory Visit: Payer: Self-pay | Admitting: Family Medicine

## 2012-09-02 VITALS — BP 132/77 | HR 88 | Ht 68.6 in | Wt 257.0 lb

## 2012-09-02 DIAGNOSIS — N939 Abnormal uterine and vaginal bleeding, unspecified: Secondary | ICD-10-CM

## 2012-09-02 DIAGNOSIS — N898 Other specified noninflammatory disorders of vagina: Secondary | ICD-10-CM

## 2012-09-02 DIAGNOSIS — Z1231 Encounter for screening mammogram for malignant neoplasm of breast: Secondary | ICD-10-CM

## 2012-09-02 DIAGNOSIS — Z20811 Contact with and (suspected) exposure to meningococcus: Secondary | ICD-10-CM

## 2012-09-02 DIAGNOSIS — Z23 Encounter for immunization: Secondary | ICD-10-CM

## 2012-09-02 DIAGNOSIS — D509 Iron deficiency anemia, unspecified: Secondary | ICD-10-CM

## 2012-09-02 DIAGNOSIS — E559 Vitamin D deficiency, unspecified: Secondary | ICD-10-CM

## 2012-09-02 DIAGNOSIS — I10 Essential (primary) hypertension: Secondary | ICD-10-CM

## 2012-09-02 DIAGNOSIS — Z0184 Encounter for antibody response examination: Secondary | ICD-10-CM

## 2012-09-02 DIAGNOSIS — N390 Urinary tract infection, site not specified: Secondary | ICD-10-CM

## 2012-09-02 LAB — HEPATITIS B SURFACE ANTIBODY,QUALITATIVE: Hep B S Ab: NEGATIVE

## 2012-09-02 LAB — FERRITIN: Ferritin: 5 ng/mL — ABNORMAL LOW (ref 10–291)

## 2012-09-02 LAB — POCT URINALYSIS DIPSTICK
Ketones, UA: NEGATIVE
Leukocytes, UA: NEGATIVE
Protein, UA: NEGATIVE
pH, UA: 6.5

## 2012-09-02 LAB — IRON: Iron: 28 ug/dL — ABNORMAL LOW (ref 42–145)

## 2012-09-02 NOTE — Progress Notes (Signed)
Subjective:    Patient ID: Brooke Kaiser, female    DOB: 1970/04/06, 42 y.o.   MRN: [id number]  HPI Having some vaginal bleeding during intercourse with her female partner.  Blood was bright red.    Not using any toys.  Partners nails are cut short.  Pap smear is uptodate. Some mild pelvic discomfort. No spotting inbetween cycles.  Last period was 7-8 days long which is unusual for her.  Usually shorter than that. She is a smoker over age 9.    Also needds forms for school. She is staring a nutrition program at [facility]  She needs documentation of vaccines.   Review of Systems  BP 132/77  Pulse 88  Ht 5' 8.6" (1.742 m)  Wt 257 lb (116.574 kg)  BMI 38.42 kg/m2    Allergies  Allergen Reactions  . Clavulanic Acid Nausea And Vomiting  . Codeine Nausea And Vomiting    Past Medical History  Diagnosis Date  . Abnormal thyroid blood test   . Hypertension   . Anemia   . Depression   . Anxiety     Past Surgical History  Procedure Laterality Date  . 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 Marital Status: Single    Spouse Name: N/A    Number of Children: N/A  . Years of Education: N/A   Occupational History  . Not on file.   Social History Main Topics  . Smoking status: Current Every Day Smoker -- 1.00 packs/day    Types: Cigarettes  . Smokeless tobacco: Never Used  . Alcohol Use: Yes  . Drug Use: No  . Sexually Active: Not on file     Comment: prefers female sex partner   Other Topics Concern  . Not on file   Social History Narrative  . No narrative on file    Family History  Problem Relation Age of Onset  . Cancer Mother 45    Breast  . Colon polyps Mother   . Other Mother     colon polyps  . Alcohol abuse Father   . Depression Father   . Suicidality Father     commited suicide  . Colon polyps Maternal Aunt     cancerous colon polyps  . Cancer Maternal Uncle     cancerous colon polyps  . Heart  disease Other   . Other Other     thyroid disorder    Outpatient Encounter Prescriptions as of 09/02/2012  Medication Sig Dispense Refill  . albuterol (PROAIR HFA) 108 (90 BASE) MCG/ACT inhaler Inhale 4 puffs into the lungs 2 (two) times daily as needed. 2-4 puffs inhaled bid prn  8.5 g  2  . ALPRAZolam (XANAX) 0.5 MG tablet Take 1 tablet (0.5 mg total) by mouth daily as needed. For anxiety  30 tablet  2  . beclomethasone (QVAR) 80 MCG/ACT inhaler Inhale 2 puffs into the lungs 2 (two) times daily.  1 Inhaler  1  . cyclobenzaprine (FLEXERIL) 10 MG tablet Take 0.5-1 tablets (5-10 mg total) by mouth at bedtime as needed for muscle spasms.  15 tablet  0  . DEXILANT 60 MG capsule TAKE ONE CAPSULE BY MOUTH DAILY  30 capsule  2  . lamoTRIgine (LAMICTAL) 100 MG tablet Take 1 tablet (100 mg total) by mouth daily.  30 tablet  5  . losartan-hydrochlorothiazide (HYZAAR) 100-25 MG per tablet TAKE 1 TABLET BY MOUTH  DAILY  30 tablet  2  . traMADol (ULTRAM) 50 MG tablet Take 1 tablet (50 mg total) by mouth every 8 (eight) hours as needed for pain.  60 tablet  0  . venlafaxine XR (EFFEXOR-XR) 75 MG 24 hr capsule Take 3 capsules (225 mg total) by mouth daily.  90 capsule  5   No facility-administered encounter medications on file as of 09/02/2012.          Objective:   Physical Exam  Constitutional: She is oriented to person, place, and time. She appears well-developed and well-nourished.  HENT:  Head: Normocephalic and atraumatic.  Genitourinary: Vagina normal and uterus normal. There is no rash, tenderness, lesion or injury on the right labia. There is no rash, tenderness, lesion or injury on the left labia. Cervix exhibits no motion tenderness, no discharge and no friability. Right adnexum displays no mass, no tenderness and no fullness. Left adnexum displays no mass, no tenderness and no fullness.  Neurological: She is alert and oriented to person, place, and time.  Skin: Skin is warm and dry.   Psychiatric: She has a normal mood and affect. Her behavior is normal.          Assessment & Plan:  Vaginal bleeding during intercourse.  Exam normal today. Will check for STD.  Pregnancy impossible. Will consider pelvic US to look for fibroid or thickened endometrial lining. Consider endometrial bx since she is a smoker.    Tob abuse - due for pneumonia vaccine  Immunity status - will test for immunity to MMR, polio.  Last Tdap uptodate.  Will complete form once available.   Due for screening mammogram.

## 2012-09-02 NOTE — Telephone Encounter (Signed)
New order entered for polio test immunity. Per Candy with [facility] instructed to enter as misc. Test and put test code in comment section. Barry Dienes, LPN

## 2012-09-03 LAB — COMPLETE METABOLIC PANEL WITH GFR
Albumin: 3.8 g/dL (ref 3.5–5.2)
Alkaline Phosphatase: 64 U/L (ref 39–117)
BUN: 10 mg/dL (ref 6–23)
Creat: 0.78 mg/dL (ref 0.50–1.10)
GFR, Est Non African American: >89 mL/min
Glucose, Bld: 105 mg/dL — ABNORMAL HIGH (ref 70–99)
Total Bilirubin: 0.3 mg/dL (ref 0.3–1.2)

## 2012-09-03 LAB — VITAMIN D 25 HYDROXY (VIT D DEFICIENCY, FRACTURES): Vit D, 25-Hydroxy: 31 ng/mL (ref 30–89)

## 2012-09-03 LAB — LIPID PANEL
Cholesterol: 141 mg/dL (ref 0–200)
HDL: 25 mg/dL — ABNORMAL LOW (ref >39)
Total CHOL/HDL Ratio: 5.6 Ratio

## 2012-09-04 ENCOUNTER — Other Ambulatory Visit: Payer: Self-pay | Admitting: Family Medicine

## 2012-09-04 LAB — WET PREP, GENITAL
Trich, Wet Prep: NONE SEEN
Yeast Wet Prep HPF POC: NONE SEEN

## 2012-09-04 LAB — GC/CHLAMYDIA PROBE AMP: CT Probe RNA: NEGATIVE

## 2012-09-04 MED ORDER — METRONIDAZOLE 500 MG PO TABS: 500.0000 mg | ORAL_TABLET | Freq: Two times a day (BID) | ORAL | Status: DC

## 2012-09-09 LAB — POLIOVIRUS ANTIBODIES, TYPES 1, 2, AND 3
Poliovirus Type 2 Antibodies: 1:128 {titer}
Poliovirus Type 3 Antibodies: 1:128 {titer}

## 2012-09-19 ENCOUNTER — Other Ambulatory Visit (HOSPITAL_COMMUNITY): Payer: Self-pay | Admitting: Psychiatry

## 2012-09-19 ENCOUNTER — Other Ambulatory Visit: Payer: Self-pay | Admitting: Family Medicine

## 2012-10-21 ENCOUNTER — Ambulatory Visit (HOSPITAL_COMMUNITY): Payer: Self-pay | Admitting: Psychiatry

## 2012-10-25 ENCOUNTER — Other Ambulatory Visit (HOSPITAL_COMMUNITY): Payer: Self-pay | Admitting: Psychiatry

## 2012-11-17 ENCOUNTER — Ambulatory Visit (HOSPITAL_COMMUNITY): Payer: Self-pay | Admitting: Psychiatry

## 2012-12-09 ENCOUNTER — Ambulatory Visit: Payer: Self-pay | Admitting: Family Medicine

## 2012-12-10 ENCOUNTER — Encounter: Payer: Self-pay | Admitting: Family Medicine

## 2012-12-10 ENCOUNTER — Ambulatory Visit (INDEPENDENT_AMBULATORY_CARE_PROVIDER_SITE_OTHER): Payer: BC Managed Care – PPO | Admitting: Family Medicine

## 2012-12-10 VITALS — BP 117/75 | HR 87 | Wt 249.0 lb

## 2012-12-10 DIAGNOSIS — L02211 Cutaneous abscess of abdominal wall: Secondary | ICD-10-CM

## 2012-12-10 DIAGNOSIS — L02219 Cutaneous abscess of trunk, unspecified: Secondary | ICD-10-CM

## 2012-12-10 MED ORDER — CEPHALEXIN 500 MG PO CAPS: 500.0000 mg | ORAL_CAPSULE | Freq: Three times a day (TID) | ORAL | Status: DC

## 2012-12-10 NOTE — Patient Instructions (Signed)
Warm compresses to the area throughout the day. Start the antibiotic today. Hopefully we'll come to head injury phone. If it doesn't then try to come in on Friday and we will lance it.  Abscess An abscess is an infected area that contains a collection of pus and debris.It can occur in almost any part of the body. An abscess is also known as a furuncle or boil. CAUSES  An abscess occurs when tissue gets infected. This can occur from blockage of oil or sweat glands, infection of hair follicles, or a minor injury to the skin. As the body tries to fight the infection, pus collects in the area and creates pressure under the skin. This pressure causes pain. People with weakened immune systems have difficulty fighting infections and get certain abscesses more often.  SYMPTOMS Usually an abscess develops on the skin and becomes a painful mass that is red, warm, and tender. If the abscess forms under the skin, you may feel a moveable soft area under the skin. Some abscesses break open (rupture) on their own, but most will continue to get worse without care. The infection can spread deeper into the body and eventually into the bloodstream, causing you to feel ill.  DIAGNOSIS  Your caregiver will take your medical history and perform a physical exam. A sample of fluid may also be taken from the abscess to determine what is causing your infection. TREATMENT  Your caregiver may prescribe antibiotic medicines to fight the infection. However, taking antibiotics alone usually does not cure an abscess. Your caregiver may need to make a small cut (incision) in the abscess to drain the pus. In some cases, gauze is packed into the abscess to reduce pain and to continue draining the area. HOME CARE INSTRUCTIONS   Only take over-the-counter or prescription medicines for pain, discomfort, or fever as directed by your caregiver.  If you were prescribed antibiotics, take them as directed. Finish them even if you start to feel  better.  If gauze is used, follow your caregiver's directions for changing the gauze.  To avoid spreading the infection:  Keep your draining abscess covered with a bandage.  Wash your hands well.  Do not share personal care items, towels, or whirlpools with others.  Avoid skin contact with others.  Keep your skin and clothes clean around the abscess.  Keep all follow-up appointments as directed by your caregiver. SEEK MEDICAL CARE IF:   You have increased pain, swelling, redness, fluid drainage, or bleeding.  You have muscle aches, chills, or a general ill feeling.  You have a fever. MAKE SURE YOU:   Understand these instructions.  Will watch your condition.  Will get help right away if you are not doing well or get worse. Document Released: 10/25/2004 Document Revised: 07/17/2011 Document Reviewed: 03/30/2011 [facility] Patient Information 2014 [place], [place].

## 2012-12-10 NOTE — Progress Notes (Signed)
  Subjective:    Patient ID: Brooke Kaiser, female    DOB: December 09, 1970, 42 y.o.   MRN: [id number]  HPI Here to address to abscess these. She says she has one along the left groin crease it started about 3 weeks ago and one on the right groin crease this started about 2 weeks ago. The one on the left has been draining. She's been trying to get the one on the right drain is seen a little bit of fluid expressed but not very much. She denies any fevers chills or sweats.   Review of Systems     Objective:   Physical Exam  Constitutional: She appears well-developed and well-nourished.  HENT:  Head: Normocephalic and atraumatic.  Skin: Skin is warm and dry. No rash noted.     Psychiatric: She has a normal mood and affect. Her behavior is normal.          Assessment & Plan:  Abscess these-the one along the left groin crease actually appears to be healing. There is actually no induration underneath the skin and unable to express any contents from the area. The one on the right still has about a 2 x 1 cm area of induration but it actually hurt he has an opening, where the lesion is draining on its own. I will go ahead and place her on oral Keflex 3 times a day for the next 10 days. If it's not draining on its own with warm compresses then recommend return in 2-3 days for I&D. Since it is starting to drain on its on I think it may resolve on its own with the oral antibiotic.

## 2012-12-24 ENCOUNTER — Other Ambulatory Visit: Payer: Self-pay | Admitting: Family Medicine

## 2012-12-24 ENCOUNTER — Other Ambulatory Visit (HOSPITAL_COMMUNITY): Payer: Self-pay | Admitting: Psychiatry

## 2013-01-07 ENCOUNTER — Ambulatory Visit (HOSPITAL_COMMUNITY): Payer: Self-pay | Admitting: Psychiatry

## 2013-01-09 NOTE — Telephone Encounter (Signed)
Filled medications 

## 2013-01-14 ENCOUNTER — Encounter (INDEPENDENT_AMBULATORY_CARE_PROVIDER_SITE_OTHER): Payer: Self-pay

## 2013-01-14 ENCOUNTER — Ambulatory Visit (INDEPENDENT_AMBULATORY_CARE_PROVIDER_SITE_OTHER): Payer: BC Managed Care – PPO | Admitting: Psychiatry

## 2013-01-14 ENCOUNTER — Encounter (HOSPITAL_COMMUNITY): Payer: Self-pay | Admitting: Psychiatry

## 2013-01-14 VITALS — BP 104/64 | HR 85 | Ht 68.6 in | Wt 251.0 lb

## 2013-01-14 DIAGNOSIS — F332 Major depressive disorder, recurrent severe without psychotic features: Secondary | ICD-10-CM

## 2013-01-14 DIAGNOSIS — F329 Major depressive disorder, single episode, unspecified: Secondary | ICD-10-CM

## 2013-01-14 DIAGNOSIS — F411 Generalized anxiety disorder: Secondary | ICD-10-CM

## 2013-01-14 NOTE — Progress Notes (Signed)
[facility] [id number] Progress Note  Brooke Kaiser [id number] 42 y.o.  01/14/2013 2:42 PM  Chief Complaint:  HPI Comments: Mrs. Boccio is  a 42 y/o female with a past psychiatric history significant for Major Depressive Disorder, and Generalized Anxiety Disorder. The patient is referred for psychiatric services for medication management.    . Location: She reports that she feels she has some lack of motivation.  . Quality: The patient reports that her main stressors are:   In the area of affective symptoms, patient appears anxious. Patient denies current suicidal ideation, intent, or plan. Patient denies current homicidal ideation, intent, or plan. Patient denies auditory hallucinations. Patient denies visual hallucinations. Patient denies symptoms of paranoia. Patient states sleep is fair, with approximately 7 hours of sleep per night. Appetite is fair. Energy level is fair. Patient endorses symptoms of anhedonia. Patient denies hopelessness, helplessness, or guilt.   . Severity: Depression: 5/10 (0=Very depressed; 5=Neutral; 10=Very Happy)  Anxiety- 3/10 (0=no anxiety; 5= moderate/tolerable anxiety; 10= panic attacks)  . Duration: The patient reports she has had anxiety since the age of 78, but became debilitating in her 36's  . Timing: No specific time of day.  . Context: Work related stressors. Health related concerns.  . Modifying factors: Improves with prayer.  . Associated signs and symptoms: As noted below    History of Present Illness: Suicidal Ideation: Negative Plan Formed: Negative Patient has means to carry out plan: Negative  Homicidal Ideation: Negative Plan Formed: Negative Patient has means to carry out plan: Negative  Review of Systems: Psychiatric: Agitation: Yes Hallucination: Negative Depressed Mood: Negative Insomnia: Negative Hypersomnia: Negative Altered Concentration: Negative Feels Worthless: Negative Grandiose Ideas:  Negative Belief In Special Powers: Negative New/Increased Substance Abuse: Negative Compulsions: Negative  Neurologic: Headache: Yes Seizure: Negative Paresthesias: Negative  Past Medical Family, Social History: Past Medical History  Diagnosis Date  . Abnormal thyroid blood test   . Hypertension   . Anemia   . Depression   . Anxiety    Family History  Problem Relation Age of Onset  . Cancer Mother 31    Breast  . Colon polyps Mother   . Other Mother     colon polyps  . Dementia Mother   . Anxiety disorder Mother   . Depression Mother   . Paranoid behavior Mother   . Depression Father   . Suicidality Father     commited suicide  . Alcohol abuse Father   . Colon polyps Maternal Aunt     cancerous colon polyps  . Cancer Maternal Uncle     cancerous colon polyps  . Heart disease Other   . Other Other     thyroid disorder  . Alcohol abuse Paternal Uncle   . Dementia Maternal Grandmother   . Alcohol abuse Paternal Grandmother   . Alcohol abuse Paternal Uncle    History   Social History  . Marital Status: Single    Spouse Name: N/A    Number of Children: N/A  . Years of Education: N/A   Social History Main Topics  . Smoking status: Current Every Day Smoker -- 1.00 packs/day for 27 years    Types: Cigarettes  . Smokeless tobacco: Never Used  . Alcohol Use: 0.0 oz/week    0.25 drink(s) per week  . Drug Use: No  . Sexual Activity: Yes    Partners: Female     Comment: prefers female sex partner   Other Topics Concern  . None  Social History Narrative  . None    Outpatient Encounter Prescriptions as of 01/14/2013  Medication Sig  . albuterol (PROAIR HFA) 108 (90 BASE) MCG/ACT inhaler Inhale 4 puffs into the lungs 2 (two) times daily as needed. 2-4 puffs inhaled bid prn  . ALPRAZolam (XANAX) 0.5 MG tablet Take 1 tablet (0.5 mg total) by mouth daily as needed. For anxiety  . beclomethasone (QVAR) 80 MCG/ACT inhaler Inhale 2 puffs into the lungs 2 (two)  times daily.  . cephALEXin (KEFLEX) 500 MG capsule Take 1 capsule (500 mg total) by mouth 3 (three) times daily.  . cyclobenzaprine (FLEXERIL) 10 MG tablet Take 0.5-1 tablets (5-10 mg total) by mouth at bedtime as needed for muscle spasms.  . DEXILANT 60 MG capsule TAKE ONE CAPSULE BY MOUTH DAILY  . lamoTRIgine (LAMICTAL) 100 MG tablet TAKE 1 TABLET (100 MG TOTAL) BY MOUTH DAILY.  Marland Kitchen lamoTRIgine (LAMICTAL) 100 MG tablet TAKE 1 TABLET (100 MG TOTAL) BY MOUTH DAILY.  Marland Kitchen losartan-hydrochlorothiazide (HYZAAR) 100-25 MG per tablet TAKE 1 TABLET BY MOUTH DAILY  . venlafaxine XR (EFFEXOR-XR) 75 MG 24 hr capsule TAKE 3 CAPSULES BY MOUTH DAILY    Past Psychiatric History/Hospitalization(s): Anxiety: Negative Bipolar Disorder: Negative Depression: Yes Mania: Negative Psychosis: Negative Schizophrenia: Negative Personality Disorder: Negative Hospitalization for psychiatric illness: Yes Age 47 History of Electroconvulsive Shock Therapy: Negative Prior Suicide Attempts: Yes-With 20 speed  Physical Exam: Constitutional:  BP 104/64  Pulse 85  Ht 5' 8.6" (1.742 m)  Wt 251 lb (113.853 kg)  BMI 37.52 kg/m2  LMP 12/31/2012  General Appearance: alert, oriented, no acute distress and well nourished  Musculoskeletal: Strength & Muscle Tone: within normal limits Gait & Station: normal Patient leans: N/A  Psychiatric: General Appearance: Casual  Eye Contact::  Good  Speech:  Clear and Coherent, Garbled and Normal Rate  Volume:  Normal  Mood:  "blah" Depression: 5/10 (0=Very depressed; 5=Neutral; 10=Very Happy)  Anxiety- 3/10 (0=no anxiety; 5= moderate/tolerable anxiety; 10= panic attacks)  Affect:  Appropriate, Congruent and Full Range  Thought Process:  Coherent  Orientation:  Full (Time, Place, and Person)  Thought Content:  WDL  Suicidal Thoughts:  No  Homicidal Thoughts:  No  Memory:  Immediate;   Good Recent;   Good Remote;   Good  Judgement:  Fair  Insight:  Good  Psychomotor  Activity:  Normal  Concentration:  Fair  Recall:  Fair  Akathisia:  No  Handed:  Right  AIMS (if indicated):   Not indicated  Assets:  Communication Skills Desire for Improvement Financial Resources/Insurance Housing Intimacy Leisure Time Physical Health Resilience Social Support Talents/Skills Transportation Vocational/Educational  Sleep:  Number of Hours: 7     Medical Decision Making (Choose Three): Established Problem, Stable/Improving (1), Review of Psycho-Social Stressors (1), New Problem, with no additional work-up planned (3) and Review of Medication Regimen & Side Effects (2)  Assessment: Axis I: Major Depressive Disorder, and Generalized Anxiety Disorder   Plan:   Plan of Care:  PLAN:  1. Affirm with the patient that the medications are taken as ordered. Patient  expressed understanding of how their medications were to be used.    Laboratory:   No labs warranted at this time.   Psychotherapy: Therapy: brief supportive therapy provided.  Discussed psychosocial stressors in detail.  Will refer to individual therapy. Continue individual therapy.  Medications:  Continue the following psychiatric medications as written prior to this appointment with the following changes::  a) Venlafaxine 75 mg 3 capsules  daily b) Lamictal 100 mg daily c) Alprazolam 0.5 mg- patient asked to use this for panic attacks only. -Risks and benefits, side effects and alternatives discussed with patient, he/she was given an opportunity to ask questions about his/her medication, illness, and treatment. All current psychiatric medications have been reviewed and discussed with the patient and adjusted as clinically appropriate. The patient has been provided an accurate and updated list of the medications being now prescribed.   Routine PRN Medications:  Negative  Consultations: The patient was encouraged to keep all PCP and specialty clinic appointments.   Safety Concerns:   Patient told to  call clinic if any problems occur. Patient advised to go to  ER  if she should develop SI/HI, side effects, or if symptoms worsen. Has crisis numbers to call if needed.    Other:   8. Patient was instructed to return to clinic in 1 months.  9. The patient was advised to call and cancel their mental health appointment within 24 hours of the appointment, if they are unable to keep the appointment, as well as the three no show and termination from clinic policy. 10. The patient expressed understanding of the plan and agrees with the above.  Time Spent: 30 minutes. Jacqulyn Cane, MD 01/14/2013

## 2013-01-25 ENCOUNTER — Other Ambulatory Visit: Payer: Self-pay | Admitting: Family Medicine

## 2013-01-26 ENCOUNTER — Encounter (HOSPITAL_COMMUNITY): Payer: Self-pay | Admitting: Emergency Medicine

## 2013-01-26 ENCOUNTER — Emergency Department (HOSPITAL_COMMUNITY): Payer: BC Managed Care – PPO

## 2013-01-26 ENCOUNTER — Emergency Department (HOSPITAL_COMMUNITY)
Admission: EM | Admit: 2013-01-26 | Discharge: 2013-01-26 | Disposition: A | Discharge status: Home / Self Care | Payer: BC Managed Care – PPO | Attending: Emergency Medicine | Admitting: Emergency Medicine

## 2013-01-26 DIAGNOSIS — R000 Tachycardia, unspecified: Secondary | ICD-10-CM | POA: Insufficient documentation

## 2013-01-26 DIAGNOSIS — R110 Nausea: Secondary | ICD-10-CM | POA: Insufficient documentation

## 2013-01-26 DIAGNOSIS — I10 Essential (primary) hypertension: Secondary | ICD-10-CM | POA: Insufficient documentation

## 2013-01-26 DIAGNOSIS — R059 Cough, unspecified: Secondary | ICD-10-CM | POA: Insufficient documentation

## 2013-01-26 DIAGNOSIS — F411 Generalized anxiety disorder: Secondary | ICD-10-CM | POA: Insufficient documentation

## 2013-01-26 DIAGNOSIS — E119 Type 2 diabetes mellitus without complications: Secondary | ICD-10-CM | POA: Insufficient documentation

## 2013-01-26 DIAGNOSIS — F3289 Other specified depressive episodes: Secondary | ICD-10-CM | POA: Insufficient documentation

## 2013-01-26 DIAGNOSIS — R0789 Other chest pain: Secondary | ICD-10-CM | POA: Insufficient documentation

## 2013-01-26 DIAGNOSIS — Z862 Personal history of diseases of the blood and blood-forming organs and certain disorders involving the immune mechanism: Secondary | ICD-10-CM | POA: Insufficient documentation

## 2013-01-26 DIAGNOSIS — R739 Hyperglycemia, unspecified: Secondary | ICD-10-CM

## 2013-01-26 DIAGNOSIS — R05 Cough: Secondary | ICD-10-CM | POA: Insufficient documentation

## 2013-01-26 DIAGNOSIS — F329 Major depressive disorder, single episode, unspecified: Secondary | ICD-10-CM | POA: Insufficient documentation

## 2013-01-26 DIAGNOSIS — Z79899 Other long term (current) drug therapy: Secondary | ICD-10-CM | POA: Insufficient documentation

## 2013-01-26 DIAGNOSIS — J3489 Other specified disorders of nose and nasal sinuses: Secondary | ICD-10-CM | POA: Insufficient documentation

## 2013-01-26 DIAGNOSIS — R079 Chest pain, unspecified: Secondary | ICD-10-CM

## 2013-01-26 DIAGNOSIS — F17200 Nicotine dependence, unspecified, uncomplicated: Secondary | ICD-10-CM | POA: Insufficient documentation

## 2013-01-26 LAB — POCT I-STAT TROPONIN I
Troponin i, poc: 0.01 ng/mL (ref 0.00–0.08)
Troponin i, poc: 0 ng/mL (ref 0.00–0.08)

## 2013-01-26 LAB — CBC
HCT: 38.4 % (ref 36.0–46.0)
Hemoglobin: 12.3 g/dL (ref 12.0–15.0)
Platelets: 365 10*3/uL (ref 150–400)
RBC: 4.68 MIL/uL (ref 3.87–5.11)
WBC: 11 10*3/uL — ABNORMAL HIGH (ref 4.0–10.5)

## 2013-01-26 LAB — BASIC METABOLIC PANEL
BUN: 11 mg/dL (ref 6–23)
CO2: 24 mEq/L (ref 19–32)
Chloride: 100 mEq/L (ref 96–112)
Glucose, Bld: 200 mg/dL — ABNORMAL HIGH (ref 70–99)
Potassium: 3.6 mEq/L (ref 3.5–5.1)
Sodium: 137 mEq/L (ref 135–145)

## 2013-01-26 NOTE — ED Provider Notes (Signed)
Plains of left-sided parasternal nonradiating chest pain for 5 days, constant, nonexertional. Patient had brief episode of nausea. Admits to constant shortness of breath. This is not a present. No fever no cough. On exam no distress lungs clear auscultation heart regular rate and rhythm abdomen obese nondistended nontender. Symptoms highly atypical for acute coronary syndrome. Patient isPERC negative. Consultation for 5 minutes on smoking cessation. Follow up with primary care physician. Diagnosis atypical chest pain  Doug Sou, MD 01/26/13 2057

## 2013-01-26 NOTE — ED Provider Notes (Signed)
CSN: [id number]     Arrival date & time 01/26/13  1542 History   First MD Initiated Contact with Patient 01/26/13 1819     Chief Complaint  Patient presents with  . Chest Pain   (Consider location/radiation/quality/duration/timing/severity/associated sxs/prior Treatment) HPI Comments: Brooke Kaiser is a 42 y.o. year-old female with a past medical history of Anxiety, tobacco abuse, HTN ,presenting the Emergency Department with a chief complaint of Left chest pain for 3 days.  The patient reports intermittent chest pain with episodes lasting hours. She reports nausea and tachycardia 3 days ago.. Reports taking a Xanax without complete resolution of symptoms.  She also complains of Productive cough, sinus pressure and rhinorrhea prior to the onset of the chest discomfort.  Father passed away at 44 due to suicide.  Mother without a history of CAD.  No siblings with a history of MI.   Patient is a 42 y.o. female presenting with chest pain. The history is provided by the patient and medical records. No language interpreter was used.  Chest Pain   Past Medical History  Diagnosis Date  . Abnormal thyroid blood test   . Hypertension   . Anemia   . Depression   . Anxiety    Past Surgical History  Procedure Laterality Date  . 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 Cancer Mother 108    Breast  . Colon polyps Mother   . Other Mother     colon polyps  . Dementia Mother   . Anxiety disorder Mother   . Depression Mother   . Paranoid behavior Mother   . Depression Father   . Suicidality Father     commited suicide  . Alcohol abuse Father   . Colon polyps Maternal Aunt     cancerous colon polyps  . Cancer Maternal Uncle     cancerous colon polyps  . Heart disease Other   . Other Other     thyroid disorder  . Alcohol abuse Paternal Uncle   . Dementia Maternal Grandmother   . Alcohol abuse Paternal Grandmother    . Alcohol abuse Paternal Uncle    History  Substance Use Topics  . Smoking status: Current Every Day Smoker -- 1.00 packs/day for 27 years    Types: Cigarettes  . Smokeless tobacco: Never Used  . Alcohol Use: 0.0 oz/week    0.25 drink(s) per week   OB History   Grav Para Term Preterm Abortions TAB SAB Ect Mult Living                 Review of Systems  Cardiovascular: Positive for chest pain.    Allergies  Clavulanic acid and Codeine  Home Medications   Current Outpatient Rx  Name  Route  Sig  Dispense  Refill  . albuterol (PROAIR HFA) 108 (90 BASE) MCG/ACT inhaler   Inhalation   Inhale 4 puffs into the lungs 2 (two) times daily as needed. 2-4 puffs inhaled bid prn   8.5 g   2   . ALPRAZolam (XANAX) 0.5 MG tablet   Oral   Take 1 tablet (0.5 mg total) by mouth daily as needed. For anxiety   30 tablet   2   . DEXILANT 60 MG capsule      TAKE ONE CAPSULE BY MOUTH DAILY   30 capsule   2   . lamoTRIgine (LAMICTAL) 100 MG tablet  TAKE 1 TABLET (100 MG TOTAL) BY MOUTH DAILY.   30 tablet   0   . losartan-hydrochlorothiazide (HYZAAR) 100-25 MG per tablet      TAKE 1 TABLET BY MOUTH DAILY   30 tablet   2   . venlafaxine XR (EFFEXOR-XR) 75 MG 24 hr capsule      TAKE 3 CAPSULES BY MOUTH DAILY   90 capsule   0    BP 121/64  Pulse 78  Temp(Src) 97.9 F (36.6 C) (Oral)  Resp 16  Ht 5' 8.5" (1.74 m)  Wt 253 lb (114.76 kg)  BMI 37.90 kg/m2  SpO2 97%  LMP 12/31/2012 Physical Exam  Nursing note and vitals reviewed. Constitutional: She is oriented to person, place, and time. She appears well-developed and well-nourished. No distress.  Abdominal exam limited by patient's body habitus.    HENT:  Head: Normocephalic and atraumatic.  Eyes: EOM are normal. Pupils are equal, round, and reactive to light. No scleral icterus.  Neck: Neck supple.  Cardiovascular: Normal rate, regular rhythm and normal heart sounds.   No murmur heard. Mild bilateral pitting  edema, equal.  Pulmonary/Chest: Effort normal and breath sounds normal. She has no wheezes. She exhibits no tenderness.  Abdominal: Soft. Bowel sounds are normal. There is no tenderness. There is no rebound and no guarding.  Musculoskeletal: Normal range of motion. She exhibits no edema.  Neurological: She is alert and oriented to person, place, and time.  Skin: Skin is warm and dry. No rash noted.  Psychiatric: She has a normal mood and affect.    ED Course  Procedures (including critical care time) Labs Review Labs Reviewed  CBC - Abnormal; Notable for the following:    WBC 11.0 (*)    RDW 16.9 (*)    All other components within normal limits  BASIC METABOLIC PANEL - Abnormal; Notable for the following:    Glucose, Bld 200 (*)    All other components within normal limits  POCT I-STAT TROPONIN I  POCT I-STAT TROPONIN I   Imaging Review Dg Chest 2 View  01/26/2013   ADDENDUM REPORT: 01/26/2013 18:54  ADDENDUM: The following is a correction to the above impression.  1. Mild lung hyperexpansion and bronchitic change WITHOUT acute cardiopulmonary disease. 2. Stable postsurgical change of the left hilum could   Electronically Signed   By: Simonne Come M.D.   On: 01/26/2013 18:54   01/26/2013   CLINICAL DATA:  Chest pain, shortness of breath, left arm pain, history of smoking  EXAM: CHEST  2 VIEW  COMPARISON:  03/28/2012; 06/10/2009; 07/22/2007  FINDINGS: Grossly unchanged cardiac silhouette and mediastinal contours with postsurgical change of the left superior hilum. The lungs appear hyperexpanded with mild diffuse slightly nodular thickening of the pulmonary interstitium. Grossly unchanged minimal left basilar linear heterogeneous opacities favored to represent atelectasis or scar. No new focal airspace opacities. No definite pleural effusion or pneumothorax. No definite evidence of edema. Grossly unchanged bones including degenerative change of the lower lumbar spine.  IMPRESSION: 1. Mild  lung hyperexpansion and bronchitic change acute cardiopulmonary disease. 2. Stable postsurgical change of the left hilum.  Electronically Signed: By: Simonne Come M.D. On: 01/26/2013 16:52    EKG Interpretation    Date/Time:  Monday January 26 2013 15:45:15 EST Ventricular Rate:  96 PR Interval:  138 QRS Duration: 86 QT Interval:  352 QTC Calculation: 444 R Axis:   23 Text Interpretation:  Normal sinus rhythm Right atrial enlargement Borderline ECG No old  tracing to compare Confirmed by JACUBOWITZ  MD, SAM ([id number]) on 01/26/2013 8:42:02 PM            MDM   1. Chest pain   2. Hyperglycemia     Pt with a several day history of left sided chest pain.  EKG-RAE.  Pt declines pain medication at this time. Will rule out CAD. Troponin negative.  Patient resting comfortably in room.  Delta troponin negative.  Discussed patient history, condition, and labs with Dr. Nickie Retort who agrees the patient can be evaluated as an out-pt. Discussed lab results, imaging results, and treatment plan with the patient. Return precautions given. Reports understanding and no other concerns at this time.  Patient is stable for discharge at this time.     Clabe Seal, PA-C 01/27/13 1440

## 2013-01-26 NOTE — ED Notes (Signed)
Pt c/o L side CP associated with L arm pain and nausea x 5 days.

## 2013-01-28 NOTE — ED Provider Notes (Signed)
Medical screening examination/treatment/procedure(s) were conducted as a shared visit with non-physician practitioner(s) and myself.  I personally evaluated the patient during the encounter.  EKG Interpretation    Date/Time:  Monday January 26 2013 15:45:15 EST Ventricular Rate:  96 PR Interval:  138 QRS Duration: 86 QT Interval:  352 QTC Calculation: 444 R Axis:   23 Text Interpretation:  Normal sinus rhythm Right atrial enlargement Borderline ECG No old tracing to compare Confirmed by Ethelda Chick  MD, Nemesio Castrillon ([id number]) on 01/26/2013 8:42:02 PM             Doug Sou, MD 01/28/13 1051

## 2013-02-08 ENCOUNTER — Other Ambulatory Visit (HOSPITAL_COMMUNITY): Payer: Self-pay | Admitting: Psychiatry

## 2013-02-08 DIAGNOSIS — F332 Major depressive disorder, recurrent severe without psychotic features: Secondary | ICD-10-CM

## 2013-02-09 NOTE — Telephone Encounter (Signed)
Refilled venlafaxine.

## 2013-02-11 ENCOUNTER — Other Ambulatory Visit (HOSPITAL_COMMUNITY): Payer: Self-pay | Admitting: Psychiatry

## 2013-02-11 ENCOUNTER — Other Ambulatory Visit: Payer: Self-pay | Admitting: Family Medicine

## 2013-02-11 ENCOUNTER — Ambulatory Visit: Payer: Self-pay | Admitting: Family Medicine

## 2013-02-12 ENCOUNTER — Encounter: Payer: Self-pay | Admitting: Family Medicine

## 2013-02-12 ENCOUNTER — Ambulatory Visit (INDEPENDENT_AMBULATORY_CARE_PROVIDER_SITE_OTHER): Payer: BC Managed Care – PPO | Admitting: Family Medicine

## 2013-02-12 VITALS — BP 132/81 | HR 87 | Temp 98.0°F | Ht 68.6 in | Wt 255.0 lb

## 2013-02-12 DIAGNOSIS — J019 Acute sinusitis, unspecified: Secondary | ICD-10-CM

## 2013-02-12 DIAGNOSIS — I10 Essential (primary) hypertension: Secondary | ICD-10-CM

## 2013-02-12 DIAGNOSIS — J209 Acute bronchitis, unspecified: Secondary | ICD-10-CM

## 2013-02-12 DIAGNOSIS — F17200 Nicotine dependence, unspecified, uncomplicated: Secondary | ICD-10-CM

## 2013-02-12 DIAGNOSIS — R7301 Impaired fasting glucose: Secondary | ICD-10-CM

## 2013-02-12 DIAGNOSIS — Z720 Tobacco use: Secondary | ICD-10-CM

## 2013-02-12 DIAGNOSIS — R0789 Other chest pain: Secondary | ICD-10-CM

## 2013-02-12 LAB — POCT GLYCOSYLATED HEMOGLOBIN (HGB A1C): HEMOGLOBIN A1C: 5.9

## 2013-02-12 MED ORDER — AZITHROMYCIN 250 MG PO TABS: ORAL_TABLET | ORAL | Status: DC

## 2013-02-12 NOTE — Progress Notes (Signed)
   Subjective:    Patient ID: Brooke Kaiser, female    DOB: 09/16/1970, 43 y.o.   MRN: [id number]  HPI Hypertension- Pt denies chest pain, SOB, dizziness, or heart palpitations.  Taking meds as directed w/o problems.  Denies medication side effects.    Was having CP and left upper arm pain and called here and told ot go to ED.  Evaluated and no MI.  She wonders if may have been stress related.   IFG - last CBG was 200 when in the ED.   Lab Results  Component Value Date   HGBA1C 6.3 06/24/2012   Feels SOB today.  + cough x 1 week. Thinks may be getting bronchitis. Using her inhaler maybe once a day.  No fever, chills, or sweats.  Very congested. + HA. No ST or ear pain. + postnasal drip.    Review of Systems     Objective:   Physical Exam  Constitutional: She is oriented to person, place, and time. She appears well-developed and well-nourished.  HENT:  Head: Normocephalic and atraumatic.  Right Ear: External ear normal.  Left Ear: External ear normal.  Nose: Nose normal.  Mouth/Throat: Oropharynx is clear and moist.  TMs and canals are clear.   Eyes: Conjunctivae and EOM are normal. Pupils are equal, round, and reactive to light.  Neck: Neck supple. No thyromegaly present.  Cardiovascular: Normal rate, regular rhythm and normal heart sounds.   Pulmonary/Chest: Effort normal. She has wheezes.  End inspiratory wheeze at the bases. Improved after deep cough  Lymphadenopathy:    She has no cervical adenopathy.  Neurological: She is alert and oriented to person, place, and time.  Skin: Skin is warm and dry.  Psychiatric: She has a normal mood and affect.          Assessment & Plan:  HTN - well controlled. Continue current regimen. I'm not exactly sure why her blood pressure was low when she went to the emergency room. She wonders if she may have accidentally taken a second blood pressure pill. She is also on diuretics and need to make sure getting in a regular amount of fluids  to avoid dehydration as well which could be a possible cause.  Atypical CP- likely stress related since rule out MI.    IFG - will recheck today. A1c looks fantastic today. She's made great progress. Continue work on diet exercise and weight loss. Lab Results  Component Value Date   HGBA1C 5.9 02/12/2013     Sinusitis/Bronchitis -  Symptoms for greater than one week. We'll go ahead and treat with azithromycin. Used the epidural inhaler every 4-6 hours as needed. She may need to increase use as she is wheezing on exam today. Peak flow in the green zone. Which is reassuring.   Recommend smoking cessation.

## 2013-02-13 NOTE — Telephone Encounter (Signed)
Called in a 30 day supply of Alprazolam, no refills.

## 2013-02-17 ENCOUNTER — Ambulatory Visit: Payer: Self-pay | Admitting: Family Medicine

## 2013-03-03 ENCOUNTER — Telehealth (HOSPITAL_COMMUNITY): Payer: Self-pay

## 2013-03-04 MED ORDER — ALPRAZOLAM 0.5 MG PO TABS: 0.5000 mg | ORAL_TABLET | Freq: Every day | ORAL | Status: DC | PRN

## 2013-03-04 NOTE — Telephone Encounter (Signed)
Refilled patient's Xanax.

## 2013-04-14 ENCOUNTER — Other Ambulatory Visit: Payer: Self-pay | Admitting: Family Medicine

## 2013-04-15 ENCOUNTER — Ambulatory Visit (HOSPITAL_COMMUNITY): Payer: Self-pay | Admitting: Psychiatry

## 2013-04-27 ENCOUNTER — Other Ambulatory Visit: Payer: Self-pay | Admitting: Family Medicine

## 2013-05-16 ENCOUNTER — Other Ambulatory Visit (HOSPITAL_COMMUNITY): Payer: Self-pay | Admitting: Psychiatry

## 2013-05-21 ENCOUNTER — Ambulatory Visit: Payer: Self-pay | Admitting: Family Medicine

## 2013-05-21 DIAGNOSIS — Z0289 Encounter for other administrative examinations: Secondary | ICD-10-CM

## 2013-05-21 NOTE — Telephone Encounter (Signed)
30 day supply authorized by Dr. Dwyane Dee in Dr. Duard Larsen absence

## 2013-06-09 ENCOUNTER — Ambulatory Visit: Payer: Self-pay | Admitting: Family Medicine

## 2013-06-11 ENCOUNTER — Ambulatory Visit: Payer: Self-pay | Admitting: Family Medicine

## 2013-06-19 ENCOUNTER — Ambulatory Visit: Payer: Self-pay | Admitting: Family Medicine

## 2013-06-21 ENCOUNTER — Other Ambulatory Visit (HOSPITAL_COMMUNITY): Payer: Self-pay | Admitting: Psychiatry

## 2013-06-22 ENCOUNTER — Other Ambulatory Visit (HOSPITAL_COMMUNITY): Payer: Self-pay | Admitting: Psychiatry

## 2013-07-07 ENCOUNTER — Encounter (INDEPENDENT_AMBULATORY_CARE_PROVIDER_SITE_OTHER): Payer: Self-pay

## 2013-07-07 ENCOUNTER — Ambulatory Visit (INDEPENDENT_AMBULATORY_CARE_PROVIDER_SITE_OTHER): Payer: BC Managed Care – PPO | Admitting: Physician Assistant

## 2013-07-07 ENCOUNTER — Encounter (HOSPITAL_COMMUNITY): Payer: Self-pay | Admitting: Physician Assistant

## 2013-07-07 ENCOUNTER — Other Ambulatory Visit: Payer: Self-pay | Admitting: Family Medicine

## 2013-07-07 VITALS — BP 122/68 | HR 93 | Wt 253.0 lb

## 2013-07-07 DIAGNOSIS — F332 Major depressive disorder, recurrent severe without psychotic features: Secondary | ICD-10-CM

## 2013-07-07 DIAGNOSIS — F411 Generalized anxiety disorder: Secondary | ICD-10-CM

## 2013-07-07 DIAGNOSIS — F329 Major depressive disorder, single episode, unspecified: Secondary | ICD-10-CM

## 2013-07-07 MED ORDER — LAMOTRIGINE 100 MG PO TABS: ORAL_TABLET | ORAL | Status: DC

## 2013-07-07 MED ORDER — VENLAFAXINE HCL ER 75 MG PO CP24: 225.0000 mg | ORAL_CAPSULE | Freq: Every day | ORAL | Status: DC

## 2013-07-07 NOTE — Patient Instructions (Signed)
Take your Effexor in the AM with or prior to breakfast.
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
## 2013-07-14 ENCOUNTER — Ambulatory Visit (INDEPENDENT_AMBULATORY_CARE_PROVIDER_SITE_OTHER): Payer: BC Managed Care – PPO | Admitting: Family Medicine

## 2013-07-14 ENCOUNTER — Encounter: Payer: Self-pay | Admitting: Family Medicine

## 2013-07-14 ENCOUNTER — Other Ambulatory Visit: Payer: Self-pay | Admitting: Family Medicine

## 2013-07-14 VITALS — BP 138/84 | HR 90 | Wt 254.0 lb

## 2013-07-14 DIAGNOSIS — R05 Cough: Secondary | ICD-10-CM

## 2013-07-14 DIAGNOSIS — R059 Cough, unspecified: Secondary | ICD-10-CM

## 2013-07-14 DIAGNOSIS — R197 Diarrhea, unspecified: Secondary | ICD-10-CM

## 2013-07-14 DIAGNOSIS — M2550 Pain in unspecified joint: Secondary | ICD-10-CM

## 2013-07-14 DIAGNOSIS — R5381 Other malaise: Secondary | ICD-10-CM

## 2013-07-14 DIAGNOSIS — R5383 Other fatigue: Secondary | ICD-10-CM

## 2013-07-14 LAB — CBC WITH DIFFERENTIAL/PLATELET
BASOS PCT: 0 % (ref 0–1)
Basophils Absolute: 0 10*3/uL (ref 0.0–0.1)
EOS PCT: 3 % (ref 0–5)
Eosinophils Absolute: 0.3 10*3/uL (ref 0.0–0.7)
HEMATOCRIT: 33.9 % — AB (ref 36.0–46.0)
HEMOGLOBIN: 10.4 g/dL — AB (ref 12.0–15.0)
Lymphocytes Relative: 28 % (ref 12–46)
Lymphs Abs: 2.5 10*3/uL (ref 0.7–4.0)
MCH: 23.1 pg — AB (ref 26.0–34.0)
MCHC: 30.7 g/dL (ref 30.0–36.0)
MCV: 75.3 fL — AB (ref 78.0–100.0)
MONO ABS: 0.8 10*3/uL (ref 0.1–1.0)
MONOS PCT: 9 % (ref 3–12)
NEUTROS ABS: 5.5 10*3/uL (ref 1.7–7.7)
Neutrophils Relative %: 60 % (ref 43–77)
Platelets: 388 10*3/uL (ref 150–400)
RBC: 4.5 MIL/uL (ref 3.87–5.11)
RDW: 18.7 % — ABNORMAL HIGH (ref 11.5–15.5)
WBC: 9.1 10*3/uL (ref 4.0–10.5)

## 2013-07-14 NOTE — Progress Notes (Signed)
   Subjective:    Patient ID: Brooke Kaiser, female    DOB: 05-Sep-1970, 43 y.o.   MRN: [id number]  HPI Polyarthralgia x 6 days - she has been using IBU 800, and ASA which helped some her pain was worse 4 days ago it was 9/10 today she reports that it is 3/10. She works a 2nd job for the last 6-8 weeks and is on her feet. Works at the [employer], elbows, knees and feet. Some pain in her hands as well. No swelling or redness of the joints. No family history of arthritis or SLE.  Pain in the left foot at the base of the 1-3rd toes.  She says it almost feels numb as well as painful. It comes and goes. Gets numbness on the 2nd toe.  Now has a cough and did have diarrhea for bout 5 days as well.  She feels some better today. Smokes 1ppd. No fevers chills or sweats. She says her achiness is similar to when she's had the flu in the past.  Review of Systems No recent travel.     Objective:   Physical Exam  Constitutional: She is oriented to person, place, and time. She appears well-developed and well-nourished.  HENT:  Head: Normocephalic and atraumatic.  Musculoskeletal:  Neck with normal range of motion. Upper extremities with normal range of motion. Strength out of 5 in upper and lower extremities. No joint swelling. She is nontender over the wrist or finger joints. Ankles with normal range of motion. No increased laxity. She is nontender along the metatarsals or the toe joints.  Neurological: She is alert and oriented to person, place, and time.  Skin: Skin is warm and dry.  Psychiatric: She has a normal mood and affect. Her behavior is normal.          Assessment & Plan:  Polyathralgia - Likely viral illness as it started 6 days ago but she actually feels a little bit better today. She never experienced any swelling or redness. Autoimmune disorder is less likely but I will check a sedimentation rate and ANA today. We'll check a CBC with differential to evaluate for elevated white blood cell  count.. As far as her foot pain is concerned she actually might be a good candidate for custom orthotics. Can discuss further at followup visit.  Cough - Her lung exam is normal today.    Diarrhea - likely form a viral illness. Work on hydration.

## 2013-07-15 LAB — ANTI-NUCLEAR AB-TITER (ANA TITER): ANA Titer 1: 1:40 {titer} — ABNORMAL HIGH

## 2013-07-15 LAB — SEDIMENTATION RATE: Sed Rate: 101 mm/hr — ABNORMAL HIGH (ref 0–22)

## 2013-07-15 LAB — VITAMIN B12: Vitamin B-12: 294 pg/mL (ref 211–911)

## 2013-07-15 LAB — FERRITIN: FERRITIN: 6 ng/mL — AB (ref 10–291)

## 2013-07-15 LAB — ANA: Anti Nuclear Antibody(ANA): POSITIVE — AB

## 2013-07-15 LAB — RHEUMATOID FACTOR

## 2013-07-17 ENCOUNTER — Other Ambulatory Visit: Payer: Self-pay | Admitting: Family Medicine

## 2013-07-17 DIAGNOSIS — R768 Other specified abnormal immunological findings in serum: Secondary | ICD-10-CM

## 2013-07-17 DIAGNOSIS — M2550 Pain in unspecified joint: Secondary | ICD-10-CM

## 2013-07-27 ENCOUNTER — Ambulatory Visit: Payer: Self-pay | Admitting: Physician Assistant

## 2013-07-29 ENCOUNTER — Ambulatory Visit (INDEPENDENT_AMBULATORY_CARE_PROVIDER_SITE_OTHER): Payer: BC Managed Care – PPO | Admitting: Physician Assistant

## 2013-07-29 ENCOUNTER — Other Ambulatory Visit: Payer: Self-pay | Admitting: Family Medicine

## 2013-07-29 ENCOUNTER — Encounter: Payer: Self-pay | Admitting: Physician Assistant

## 2013-07-29 VITALS — BP 144/83 | HR 97 | Ht 68.6 in | Wt 257.0 lb

## 2013-07-29 DIAGNOSIS — K029 Dental caries, unspecified: Secondary | ICD-10-CM

## 2013-07-29 DIAGNOSIS — K120 Recurrent oral aphthae: Secondary | ICD-10-CM

## 2013-07-29 MED ORDER — TRIAMCINOLONE ACETONIDE 0.1 % MT PSTE
1.0000 | PASTE | Freq: Three times a day (TID) | OROMUCOSAL | Status: DC
Start: 2013-07-29 — End: 2014-05-10

## 2013-07-29 MED ORDER — HYDROCODONE-ACETAMINOPHEN 5-325 MG PO TABS: 1.0000 | ORAL_TABLET | Freq: Three times a day (TID) | ORAL | Status: DC | PRN

## 2013-07-29 MED ORDER — AMOXICILLIN 500 MG PO CAPS: 500.0000 mg | ORAL_CAPSULE | Freq: Two times a day (BID) | ORAL | Status: DC

## 2013-07-29 NOTE — Patient Instructions (Signed)
Canker Sores  °Canker sores are painful, open sores on the inside of the mouth and cheek. They may be white or yellow. The sores usually heal in 1 to 2 weeks. Women are more likely than men to have recurrent canker sores. °CAUSES °The cause of canker sores is not well understood. More than one cause is likely. Canker sores do not appear to be caused by certain types of germs (viruses or bacteria). Canker sores may be caused by: °· An allergic reaction to certain foods. °· Digestive problems. °· Not having enough vitamin B12, folic acid, and iron. °· Female sex hormones. Sores may come only during certain phases of a menstrual cycle. Often, there is improvement during pregnancy. °· Genetics. Some people seem to inherit canker sore problems. °Emotional stress and injuries to the mouth may trigger outbreaks, but not cause them.  °DIAGNOSIS °Canker sores are diagnosed by exam.  °TREATMENT °· Patients who have frequent bouts of canker sores may have cultures taken of the sores, blood tests, or allergy tests. This helps determine if their sores are caused by a poor diet, an allergy, or some other preventable or treatable disease. °· Vitamins may prevent recurrences or reduce the severity of canker sores in people with poor nutrition. °· Numbing ointments can relieve pain. These are available in drug stores without a prescription. °· Anti-inflammatory steroid mouth rinses or gels may be prescribed by your caregiver for severe sores. °· Oral steroids may be prescribed if you have severe, recurrent canker sores. These strong medicines can cause many side effects and should be used only under the close direction of a dentist or physician. °· Mouth rinses containing the antibiotic medicine may be prescribed. They may lessen symptoms and speed healing. °Healing usually happens in about 1 or 2 weeks with or without treatment. Certain antibiotic mouth rinses given to pregnant women and young children can permanently stain teeth.  Talk to your caregiver about your treatment. °HOME CARE INSTRUCTIONS  °· Avoid foods that cause canker sores for you. °· Avoid citrus juices, spicy or salty foods, and coffee until the sores are healed. °· Use a soft-bristled toothbrush. °· Chew your food carefully to avoid biting your cheek. °· Apply topical numbing medicine to the sore to help relieve pain. °· Apply a thin paste of baking soda and water to the sore to help heal the sore. °· Only use mouth rinses or medicines for pain or discomfort as directed by your caregiver. °SEEK MEDICAL CARE IF:  °· Your symptoms are not better in 1 week. °· Your sores are still present after 2 weeks. °· Your sores are very painful. °· You have trouble breathing or swallowing. °· Your sores come back frequently. °Document Released: 05/12/2010 Document Revised: 05/12/2012 Document Reviewed: 05/12/2010 °ExitCare® Patient Information ©2015 [employer]. This information is not intended to replace advice given to you by your health care provider. Make sure you discuss any questions you have with your health care provider. ° °

## 2013-07-30 ENCOUNTER — Other Ambulatory Visit (HOSPITAL_COMMUNITY): Payer: Self-pay | Admitting: Psychiatry

## 2013-07-30 NOTE — Telephone Encounter (Signed)
Checked chart, medication was refilled and they are available for her at the pharmacy. Left a msg on her phone to check with her [facility] when she needs to pick it up. Marlane Hatcher. Sondra Blixt RPAC 4:38 PM 07/30/2013

## 2013-08-02 NOTE — Progress Notes (Signed)
   Subjective:    Patient ID: Brooke Kaiser, female    DOB: 08/15/1970, 43 y.o.   MRN: [id number]  HPI Pt presents with upper tooth on right side pain for last 3 days. She is aware of need for dentist but does not have dental insurance. Pain is 7/10 at times. Radiates into right nare. Eating makes worse. Tylenol and motrin not helping very much. No fever, chills, n/v/d.    Review of Systems  All other systems reviewed and are negative.      Objective:   Physical Exam  Constitutional: She is oriented to person, place, and time. She appears well-developed and well-nourished.  HENT:  Head: Normocephalic and atraumatic.  Mouth/Throat: Oropharynx is clear and moist.    No sinus tenderness or pressure to palpation.  Pain to palpation under right nare.   Neck: Neck supple.  Cardiovascular: Normal rate, regular rhythm and normal heart sounds.   Pulmonary/Chest: Effort normal and breath sounds normal.  Lymphadenopathy:    She has no cervical adenopathy.  Neurological: She is alert and oriented to person, place, and time.  Skin: Skin is dry.  Psychiatric: She has a normal mood and affect. Her behavior is normal.          Assessment & Plan:  Dental pain/apthous ulcer/dental caries- treated with amoxicillin for suspected infection. She does also have some ulcer accumuation on gums of upper palate. Gave triamcinolone cream for that. norco for pain. Needs to have teeth pulled. Pt aware.

## 2013-08-11 ENCOUNTER — Ambulatory Visit (INDEPENDENT_AMBULATORY_CARE_PROVIDER_SITE_OTHER): Payer: BC Managed Care – PPO | Admitting: Family Medicine

## 2013-08-11 ENCOUNTER — Other Ambulatory Visit (HOSPITAL_COMMUNITY)
Admission: RE | Admit: 2013-08-11 | Discharge: 2013-08-11 | Disposition: A | Discharge status: Home / Self Care | Payer: BC Managed Care – PPO | Source: Ambulatory Visit | Attending: Family Medicine | Admitting: Family Medicine

## 2013-08-11 ENCOUNTER — Encounter: Payer: Self-pay | Admitting: Family Medicine

## 2013-08-11 VITALS — BP 121/73 | HR 82 | Ht 68.6 in | Wt 250.0 lb

## 2013-08-11 DIAGNOSIS — Z01419 Encounter for gynecological examination (general) (routine) without abnormal findings: Secondary | ICD-10-CM | POA: Diagnosis not present

## 2013-08-11 DIAGNOSIS — Z1231 Encounter for screening mammogram for malignant neoplasm of breast: Secondary | ICD-10-CM | POA: Diagnosis not present

## 2013-08-11 DIAGNOSIS — Z0000 Encounter for general adult medical examination without abnormal findings: Secondary | ICD-10-CM

## 2013-08-11 DIAGNOSIS — Z124 Encounter for screening for malignant neoplasm of cervix: Secondary | ICD-10-CM

## 2013-08-11 DIAGNOSIS — N949 Unspecified condition associated with female genital organs and menstrual cycle: Secondary | ICD-10-CM

## 2013-08-11 DIAGNOSIS — R102 Pelvic and perineal pain unspecified side: Secondary | ICD-10-CM

## 2013-08-11 LAB — POCT URINALYSIS DIPSTICK
Bilirubin, UA: NEGATIVE
Glucose, UA: NEGATIVE
KETONES UA: NEGATIVE
Leukocytes, UA: NEGATIVE
Nitrite, UA: NEGATIVE
PROTEIN UA: NEGATIVE
RBC UA: NEGATIVE
SPEC GRAV UA: 1.025
Urobilinogen, UA: 0.2
pH, UA: 6.5

## 2013-08-11 NOTE — Progress Notes (Signed)
  Subjective:     Brooke Kaiser is a 43 y.o. female and is here for a comprehensive physical exam. The patient reports no problems.  History   Social History  . Marital Status: Single    Spouse Name: N/A    Number of Children: N/A  . Years of Education: N/A   Occupational History  . Not on file.   Social History Main Topics  . Smoking status: Current Every Day Smoker -- 1.00 packs/day for 27 years    Types: Cigarettes  . Smokeless tobacco: Never Used  . Alcohol Use: 0.0 oz/week    0.25 drink(s) per week  . Drug Use: No  . Sexual Activity: Yes    Partners: Female     Comment: prefers female sex partner   Other Topics Concern  . Not on file   Social History Narrative  . No narrative on file   Health Maintenance  Topic Date Due  . Influenza Vaccine  08/29/2013  . Pap Smear  12/04/2014  . Tetanus/tdap  12/10/2018    The following portions of the patient's history were reviewed and updated as appropriate: allergies, current medications, past family history, past medical history, past social history, past surgical history and problem list.  Review of Systems A comprehensive review of systems was negative.   Objective:    BP 121/73  Pulse 82  Ht 5' 8.6" (1.742 m)  Wt 250 lb (113.399 kg)  BMI 37.37 kg/m2 General appearance: alert, cooperative and appears stated age Head: Normocephalic, without obvious abnormality, atraumatic Eyes: conj clear, EOMI, PEERLA Ears: normal TM's and external ear canals both ears Nose: Nares normal. Septum midline. Mucosa normal. No drainage or sinus tenderness. Throat: lips, mucosa, and tongue normal; teeth and gums normal Neck: no adenopathy, no carotid bruit, no JVD, supple, symmetrical, trachea midline and thyroid not enlarged, symmetric, no tenderness/mass/nodules Back: symmetric, no curvature. ROM normal. No CVA tenderness. Lungs: clear to auscultation bilaterally Breasts: normal appearance, no masses or tenderness Heart: regular  rate and rhythm, S1, S2 normal, no murmur, click, rub or gallop Abdomen: soft, non-tender; bowel sounds normal; no masses,  no organomegaly Pelvic: cervix normal in appearance, external genitalia normal, no adnexal masses or tenderness, no cervical motion tenderness, rectovaginal septum normal, uterus normal size, shape, and consistency and vagina normal without discharge Extremities: extremities normal, atraumatic, no cyanosis or edema Pulses: 2+ and symmetric Skin: Skin color, texture, turgor normal. No rashes or lesions Lymph nodes: Cervical, supraclavicular, and axillary nodes normal. Neurologic: Alert and oriented X 3, normal strength and tone. Normal symmetric reflexes. Normal coordination and gait    Assessment:    Healthy female exam.     Plan:     See After Visit Summary for Counseling Recommendations  Keep up a regular exercise program and make sure you are eating a healthy diet Try to eat 4 servings of dairy a day, or if you are lactose intolerant take a calcium with vitamin D daily.  Your vaccines are up to date.  Mammogram ordered.    Pelvic pressure - check urinalysis today for possible UTI. She's not had any dysuria or hematuria no fevers chills or sweats just a little bit of pressure. Urinalysis was negative. No sign of UTI.

## 2013-08-11 NOTE — Patient Instructions (Signed)
Keep up a regular exercise program and make sure you are eating a healthy diet Try to eat 4 servings of dairy a day, or if you are lactose intolerant take a calcium with vitamin D daily.  Your vaccines are up to date.   

## 2013-08-12 LAB — COMPLETE METABOLIC PANEL WITH GFR
ALT: 27 U/L (ref 0–35)
AST: 21 U/L (ref 0–37)
Albumin: 4.2 g/dL (ref 3.5–5.2)
Alkaline Phosphatase: 58 U/L (ref 39–117)
BILIRUBIN TOTAL: 0.4 mg/dL (ref 0.2–1.2)
BUN: 12 mg/dL (ref 6–23)
CO2: 24 mEq/L (ref 19–32)
Calcium: 9.3 mg/dL (ref 8.4–10.5)
Chloride: 103 mEq/L (ref 96–112)
Creat: 0.64 mg/dL (ref 0.50–1.10)
GFR, Est African American: >89 mL/min
GLUCOSE: 115 mg/dL — AB (ref 70–99)
Potassium: 4 mEq/L (ref 3.5–5.3)
SODIUM: 139 meq/L (ref 135–145)
Total Protein: 7.2 g/dL (ref 6.0–8.3)

## 2013-08-12 LAB — LIPID PANEL
Cholesterol: 147 mg/dL (ref 0–200)
HDL: 31 mg/dL — ABNORMAL LOW (ref >39)
LDL Cholesterol: 79 mg/dL (ref 0–99)
Total CHOL/HDL Ratio: 4.7 Ratio
Triglycerides: 185 mg/dL — ABNORMAL HIGH (ref <150)
VLDL: 37 mg/dL (ref 0–40)

## 2013-08-12 LAB — CYTOLOGY - PAP

## 2013-08-13 NOTE — Progress Notes (Signed)
Quick Note:  Call patient: Your Pap smear is normal. Repeat in 2-3 years. ______ 

## 2013-08-18 ENCOUNTER — Ambulatory Visit (INDEPENDENT_AMBULATORY_CARE_PROVIDER_SITE_OTHER): Payer: BC Managed Care – PPO | Admitting: Professional Counselor

## 2013-08-18 DIAGNOSIS — F332 Major depressive disorder, recurrent severe without psychotic features: Secondary | ICD-10-CM

## 2013-08-18 NOTE — Progress Notes (Signed)
Brooke Kaiser is a 43 y.o. female patient who was scheduled to establish care with therapist however did not show up for appointment or call.  Currently no appointment rescheduled, but call placed for patient to call if wanting to reschedule.         Lilly Cove, LCSW

## 2013-09-15 ENCOUNTER — Ambulatory Visit: Payer: Self-pay

## 2013-09-22 DIAGNOSIS — R700 Elevated erythrocyte sedimentation rate: Secondary | ICD-10-CM | POA: Insufficient documentation

## 2013-09-22 DIAGNOSIS — M2550 Pain in unspecified joint: Secondary | ICD-10-CM | POA: Insufficient documentation

## 2013-09-22 DIAGNOSIS — M791 Myalgia, unspecified site: Secondary | ICD-10-CM | POA: Insufficient documentation

## 2013-09-22 DIAGNOSIS — M359 Systemic involvement of connective tissue, unspecified: Secondary | ICD-10-CM | POA: Insufficient documentation

## 2013-09-22 DIAGNOSIS — R7689 Other specified abnormal immunological findings in serum: Secondary | ICD-10-CM | POA: Insufficient documentation

## 2013-09-22 DIAGNOSIS — R768 Other specified abnormal immunological findings in serum: Secondary | ICD-10-CM | POA: Insufficient documentation

## 2013-10-01 ENCOUNTER — Other Ambulatory Visit: Payer: Self-pay | Admitting: Family Medicine

## 2013-11-03 ENCOUNTER — Ambulatory Visit (HOSPITAL_COMMUNITY): Payer: Self-pay | Admitting: Physician Assistant

## 2013-11-04 ENCOUNTER — Encounter (INDEPENDENT_AMBULATORY_CARE_PROVIDER_SITE_OTHER): Payer: Self-pay

## 2013-11-04 ENCOUNTER — Encounter (HOSPITAL_COMMUNITY): Payer: Self-pay | Admitting: Physician Assistant

## 2013-11-04 ENCOUNTER — Ambulatory Visit (INDEPENDENT_AMBULATORY_CARE_PROVIDER_SITE_OTHER): Payer: BC Managed Care – PPO | Admitting: Physician Assistant

## 2013-11-04 VITALS — BP 148/77 | HR 76 | Ht 68.5 in | Wt 259.0 lb

## 2013-11-04 DIAGNOSIS — F411 Generalized anxiety disorder: Secondary | ICD-10-CM

## 2013-11-04 DIAGNOSIS — F331 Major depressive disorder, recurrent, moderate: Secondary | ICD-10-CM

## 2013-11-04 DIAGNOSIS — F329 Major depressive disorder, single episode, unspecified: Secondary | ICD-10-CM

## 2013-11-04 DIAGNOSIS — M109 Gout, unspecified: Secondary | ICD-10-CM

## 2013-11-04 MED ORDER — VENLAFAXINE HCL ER 75 MG PO CP24
225.0000 mg | ORAL_CAPSULE | Freq: Every day | ORAL | Status: DC
Start: 2013-11-04 — End: 2014-04-16

## 2013-11-04 MED ORDER — LAMOTRIGINE 25 MG PO TABS: ORAL_TABLET | ORAL | Status: DC
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
## 2013-11-04 NOTE — Patient Instructions (Signed)
1. Add Vitamin B complex to your regimen. 2. Add Vitamin D3 (2000 IU) two tabs each day to your regimen. 3. Restarting lamotrigine as discussed. 4. Continue the Effexor XR as written. 5. Follow up 1 month.

## 2013-11-18 ENCOUNTER — Ambulatory Visit: Payer: Self-pay | Admitting: Family Medicine

## 2013-12-09 ENCOUNTER — Ambulatory Visit (INDEPENDENT_AMBULATORY_CARE_PROVIDER_SITE_OTHER): Payer: BC Managed Care – PPO

## 2013-12-09 ENCOUNTER — Ambulatory Visit (HOSPITAL_COMMUNITY): Payer: Self-pay | Admitting: Physician Assistant

## 2013-12-09 DIAGNOSIS — Z1231 Encounter for screening mammogram for malignant neoplasm of breast: Secondary | ICD-10-CM

## 2013-12-12 ENCOUNTER — Other Ambulatory Visit (HOSPITAL_COMMUNITY): Payer: Self-pay | Admitting: Physician Assistant

## 2013-12-20 ENCOUNTER — Other Ambulatory Visit: Payer: Self-pay | Admitting: Family Medicine

## 2014-01-27 IMAGING — CR DG LUMBAR SPINE 2-3V
3 series · 3 of 3 positions shown · non-contrast
Comparison: None.

CLINICAL DATA: Chronic low back pain

LUMBAR SPINE - 2-3 VIEW

[view not recorded (1 of 3)]
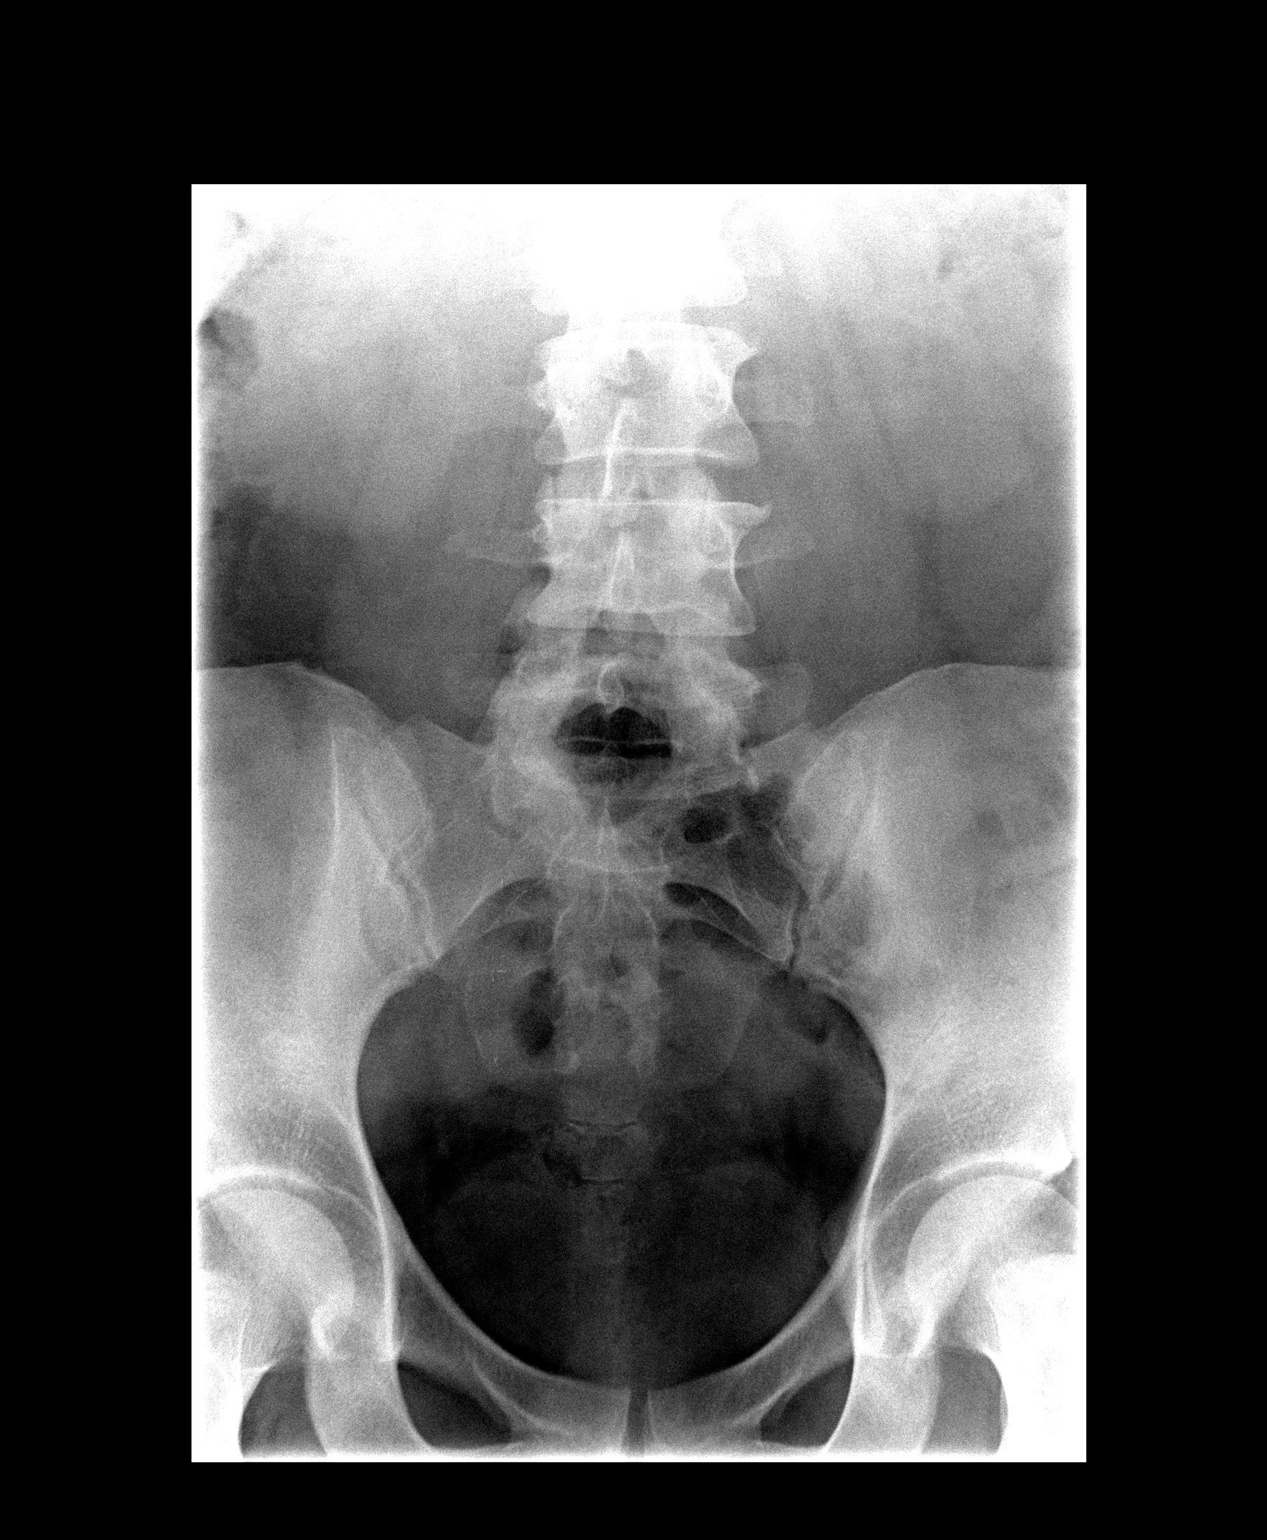

[view not recorded (2 of 3)]
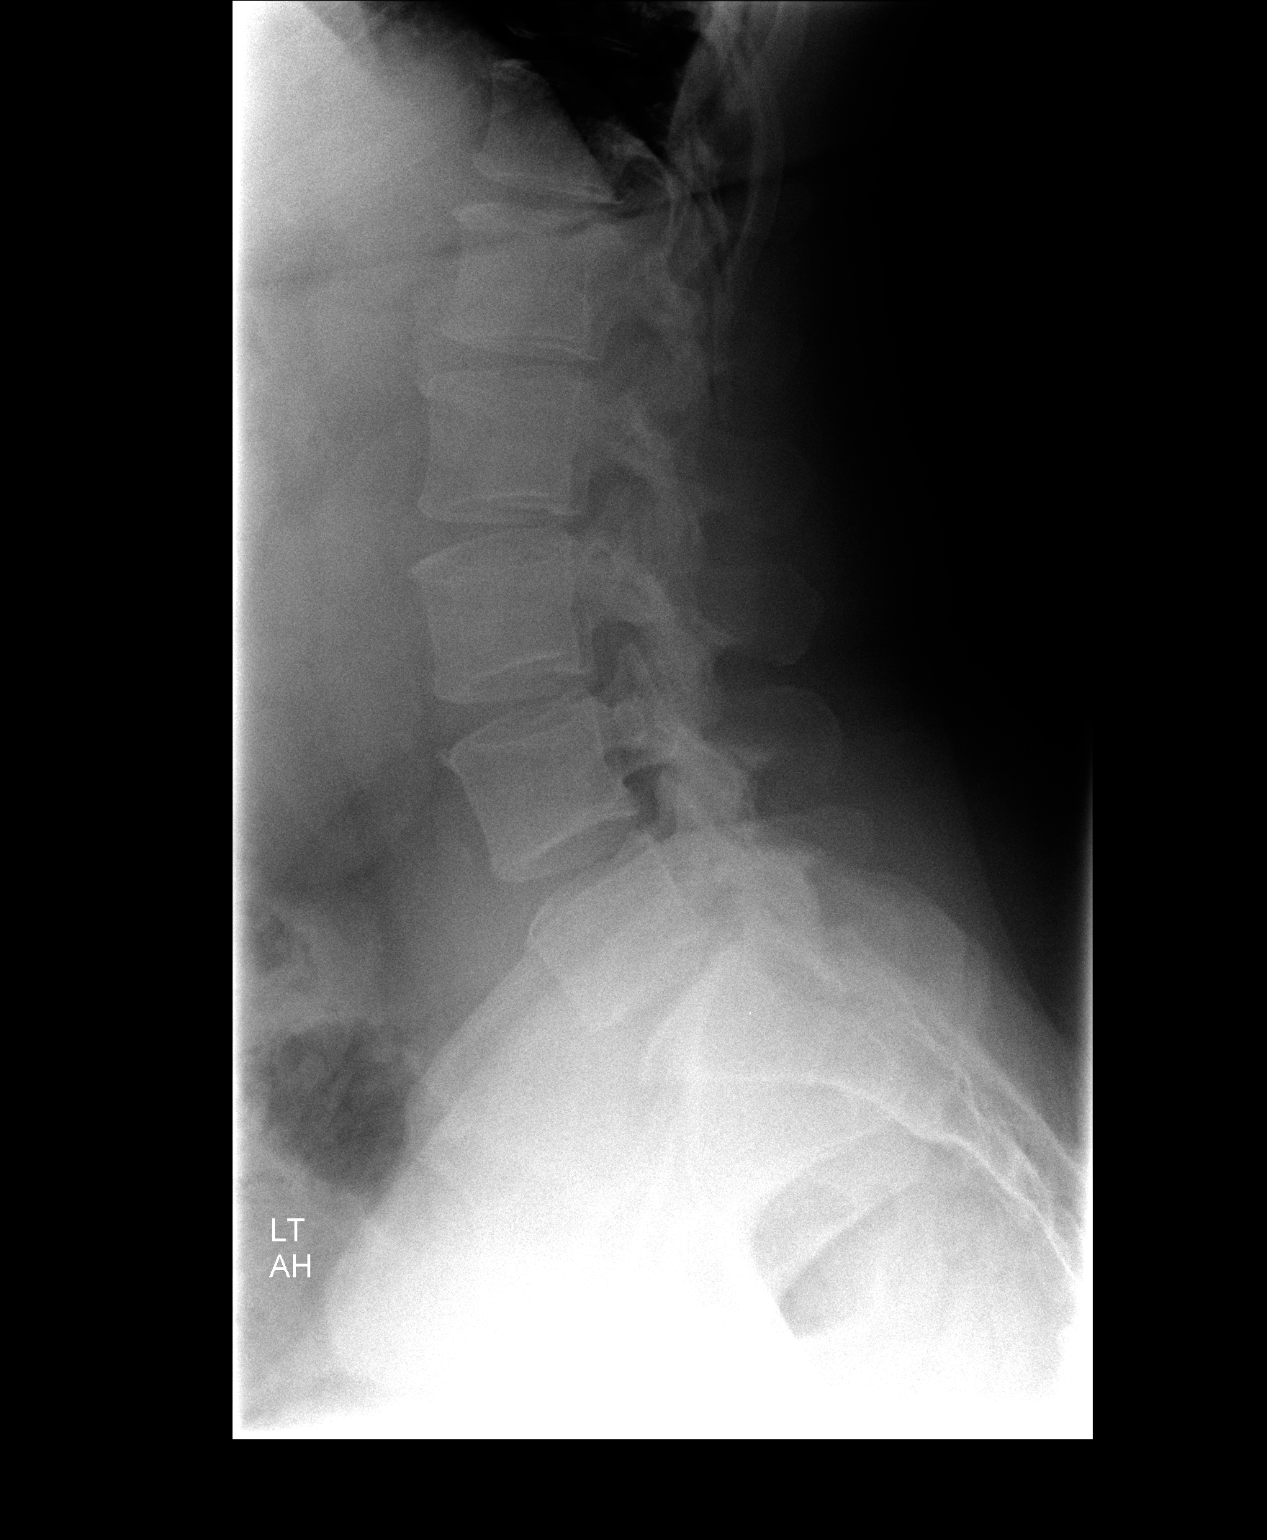

[view not recorded (3 of 3)]
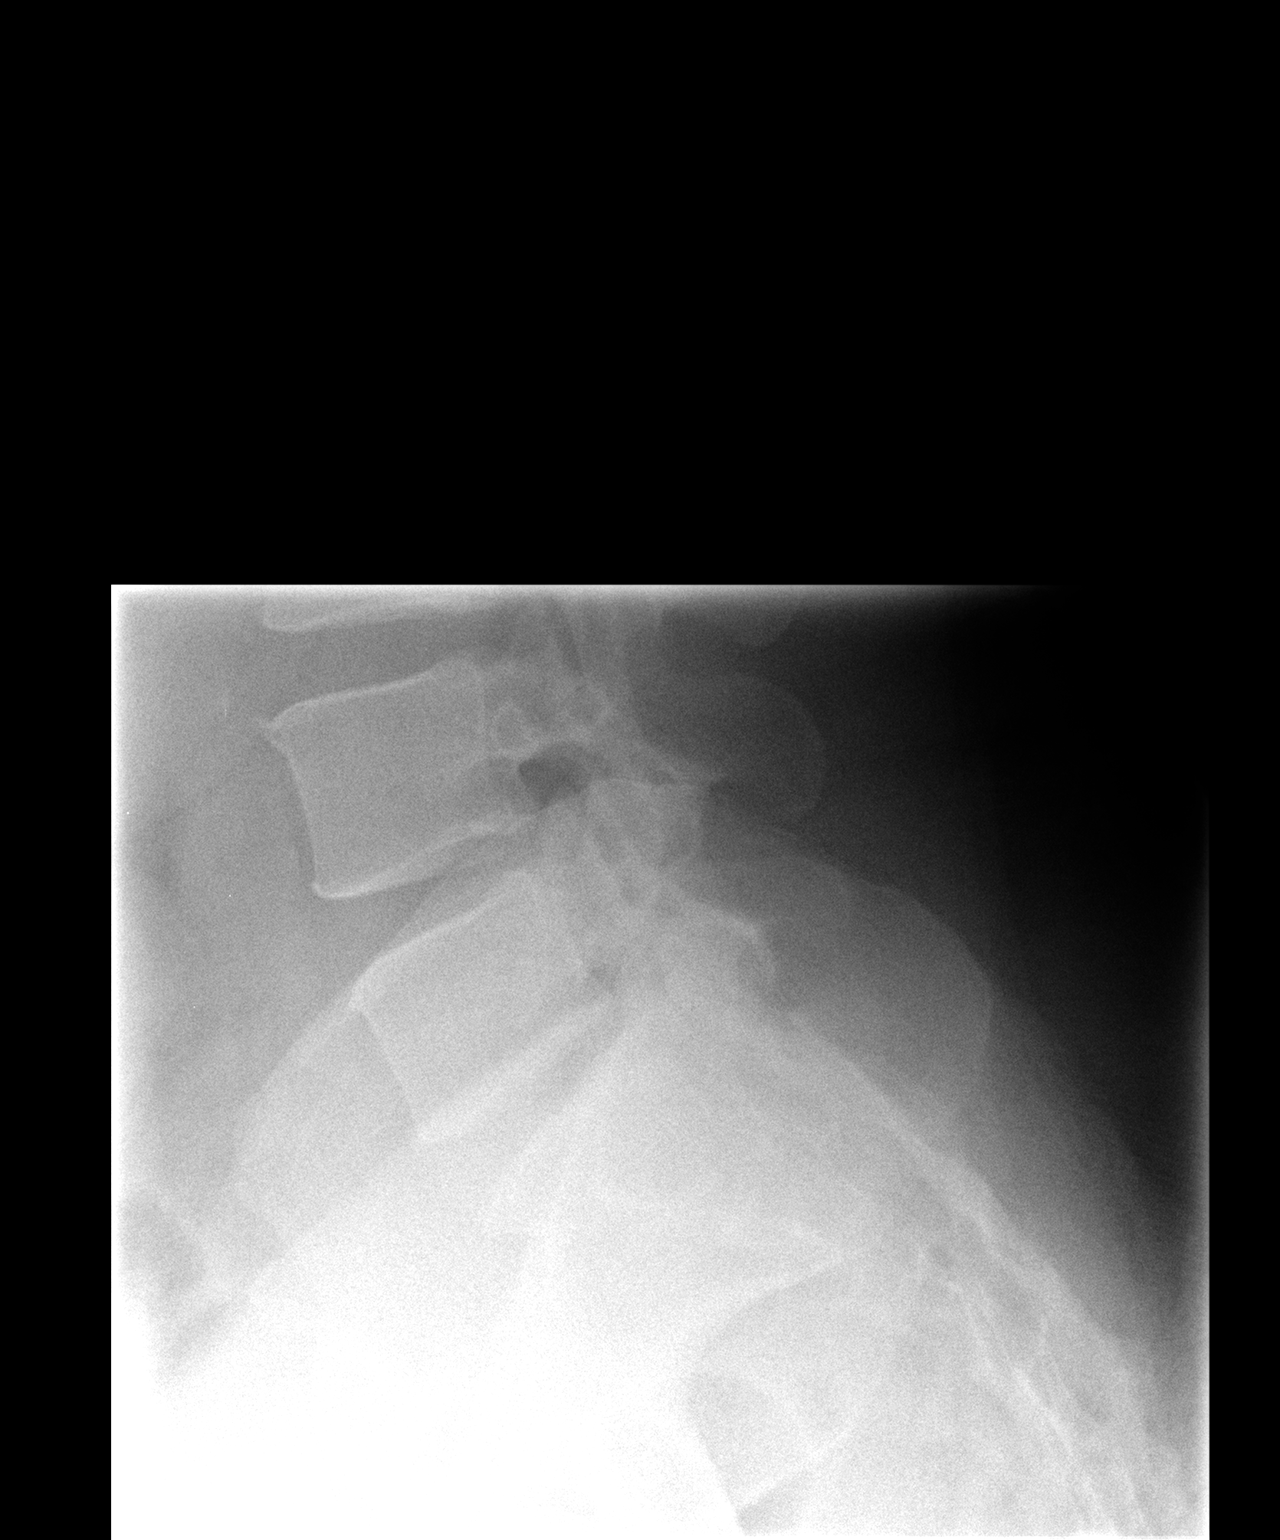

[3 of 3 positions shown; findings below may reference images not displayed]

FINDINGS: Mild levoscoliosis.  Normal sagittal alignment.  No
fracture or mass lesion.  Disc spaces are well maintained.
IMPRESSION: No acute abnormality.

## 2014-02-09 ENCOUNTER — Other Ambulatory Visit: Payer: Self-pay | Admitting: Family Medicine

## 2014-03-02 ENCOUNTER — Encounter: Payer: Self-pay | Admitting: Family Medicine

## 2014-03-02 ENCOUNTER — Ambulatory Visit (INDEPENDENT_AMBULATORY_CARE_PROVIDER_SITE_OTHER): Payer: BLUE CROSS/BLUE SHIELD | Admitting: Family Medicine

## 2014-03-02 VITALS — BP 131/74 | HR 81 | Ht 68.5 in | Wt 257.0 lb

## 2014-03-02 DIAGNOSIS — R635 Abnormal weight gain: Secondary | ICD-10-CM | POA: Diagnosis not present

## 2014-03-02 DIAGNOSIS — I10 Essential (primary) hypertension: Secondary | ICD-10-CM | POA: Diagnosis not present

## 2014-03-02 DIAGNOSIS — J4540 Moderate persistent asthma, uncomplicated: Secondary | ICD-10-CM

## 2014-03-02 DIAGNOSIS — D509 Iron deficiency anemia, unspecified: Secondary | ICD-10-CM

## 2014-03-02 DIAGNOSIS — K649 Unspecified hemorrhoids: Secondary | ICD-10-CM

## 2014-03-02 LAB — BASIC METABOLIC PANEL
BUN: 13 mg/dL (ref 6–23)
CO2: 27 meq/L (ref 19–32)
Calcium: 9.4 mg/dL (ref 8.4–10.5)
Chloride: 102 mEq/L (ref 96–112)
Creat: 0.67 mg/dL (ref 0.50–1.10)
Glucose, Bld: 145 mg/dL — ABNORMAL HIGH (ref 70–99)
Potassium: 3.9 mEq/L (ref 3.5–5.3)
Sodium: 139 mEq/L (ref 135–145)

## 2014-03-02 LAB — HEMOGLOBIN: Hemoglobin: 14 g/dL (ref 12.0–15.0)

## 2014-03-02 MED ORDER — FLUTICASONE PROPIONATE HFA 110 MCG/ACT IN AERO: 2.0000 | INHALATION_SPRAY | Freq: Two times a day (BID) | RESPIRATORY_TRACT | Status: DC

## 2014-03-02 NOTE — Progress Notes (Signed)
   Subjective:    Patient ID: Brooke Kaiser, female    DOB: 1970/09/19, 44 y.o.   MRN: [id number]  HPI Hypertension- Pt denies chest pain, SOB, dizziness, or heart palpitations.  Taking meds as directed w/o problems.  Denies medication side effects.  Home Bps 125/65.    Iron def anemia - has hemorrhoids. Has been taking iron daily for 2-3 months.  Periods are more irregular, but they are heavy.    Wheezing on and off x 2 month and had developed a cough in the last 2 weeks.  Dry ST today.  No ear pain. No fever, chills, or sweats. Cough is occ productive. Still smoking. Has not been using her albuterol inhaler but says she does have one at home.  Review of Systems     Objective:   Physical Exam  Constitutional: She is oriented to person, place, and time. She appears well-developed and well-nourished.  HENT:  Head: Normocephalic and atraumatic.  Right Ear: External ear normal.  Left Ear: External ear normal.  Nose: Nose normal.  Mouth/Throat: Oropharynx is clear and moist.  TMs and canals are clear.   Eyes: Conjunctivae and EOM are normal. Pupils are equal, round, and reactive to light.  Neck: Neck supple. No thyromegaly present.  Cardiovascular: Normal rate, regular rhythm and normal heart sounds.   Pulmonary/Chest: Effort normal and breath sounds normal. She has no wheezes.  Lymphadenopathy:    She has no cervical adenopathy.  Neurological: She is alert and oriented to person, place, and time.  Skin: Skin is warm and dry.  Psychiatric: She has a normal mood and affect. Her behavior is normal.          Assessment & Plan:  HTN - well controlled.  Continue current regimen.  F/U in 6 months.    Iron def anemia - Due to recheck iron levels. May be secondary heavy periods, but recommend GI screening. Will place referral.  She is feeling much better. Feeling less achey and pain.   Asthma- moderate persistent-we'll add inhaled corticosteroid. We'll start Flovent. If she's doing  well after 2-3 months then we can discontinue. Encouraged her to use her albuterol as needed as she has not been using it at all.

## 2014-03-03 LAB — TSH: TSH: 1.833 u[IU]/mL (ref 0.350–4.500)

## 2014-03-03 LAB — FERRITIN: Ferritin: 54 ng/mL (ref 10–291)

## 2014-03-12 ENCOUNTER — Other Ambulatory Visit: Payer: Self-pay | Admitting: Family Medicine

## 2014-03-13 ENCOUNTER — Other Ambulatory Visit: Payer: Self-pay | Admitting: Family Medicine

## 2014-04-09 ENCOUNTER — Other Ambulatory Visit (HOSPITAL_COMMUNITY): Payer: Self-pay | Admitting: Physician Assistant

## 2014-04-16 ENCOUNTER — Encounter (INDEPENDENT_AMBULATORY_CARE_PROVIDER_SITE_OTHER): Payer: Self-pay

## 2014-04-16 ENCOUNTER — Ambulatory Visit: Payer: Self-pay | Admitting: Family Medicine

## 2014-04-16 ENCOUNTER — Ambulatory Visit (INDEPENDENT_AMBULATORY_CARE_PROVIDER_SITE_OTHER): Payer: BLUE CROSS/BLUE SHIELD | Admitting: Physician Assistant

## 2014-04-16 ENCOUNTER — Encounter (HOSPITAL_COMMUNITY): Payer: Self-pay | Admitting: Physician Assistant

## 2014-04-16 VITALS — BP 134/82 | HR 66 | Ht 68.5 in | Wt 254.0 lb

## 2014-04-16 DIAGNOSIS — F411 Generalized anxiety disorder: Secondary | ICD-10-CM

## 2014-04-16 DIAGNOSIS — Z9114 Patient's other noncompliance with medication regimen: Secondary | ICD-10-CM

## 2014-04-16 DIAGNOSIS — F331 Major depressive disorder, recurrent, moderate: Secondary | ICD-10-CM

## 2014-04-16 MED ORDER — TRAZODONE HCL 50 MG PO TABS: 50.0000 mg | ORAL_TABLET | Freq: Every day | ORAL | Status: DC

## 2014-04-16 MED ORDER — VENLAFAXINE HCL ER 75 MG PO CP24: 225.0000 mg | ORAL_CAPSULE | Freq: Every day | ORAL | Status: DC

## 2014-04-16 NOTE — Patient Instructions (Signed)
1. Add Vitamin B complex to your regimen. 2. Add Vitamin D3 (2000 IU) two tabs each day to your regimen. 3. Increase lamotrigine as discussed. To 100mg  at bedtime 4. Continue the Effexor XR as written. 5. Follow up 2 weeks.

## 2014-04-16 NOTE — Progress Notes (Signed)
  [facility] [phone] Progress Note  Brooke Kaiser [id number] 44 y.o.  04/16/2014 11:26 AM  Chief Complaint:  Depression and anxiety History of Present Illness: Patient is in to follow up after a 5  Month absence. In the interim she has been fired from her job, at [employer] due to an insubordination Suicidal Ideation: No Plan Formed: No Patient has means to carry out plan: No  Homicidal Ideation: No Plan Formed: No Patient has means to carry out plan: No  Review of Systems: Psychiatric: Agitation: Yes Hallucination: No Depressed Mood: Yes Insomnia: Yes Hypersomnia: No Altered Concentration: No Feels Worthless: No Grandiose Ideas: No Belief In Special Powers: No New/Increased Substance Abuse: No Compulsions: No  Neurologic: Headache: Yes Seizure: No Paresthesias: Yes  Past Medical Family, Social History:  Recent job loss <2 weeks.  Outpatient Encounter Prescriptions as of 04/16/2014  Medication Sig  . ALPRAZolam (XANAX) 0.5 MG tablet Take 1 tablet (0.5 mg total) by mouth daily as needed for anxiety. For anxiety  . DEXILANT 60 MG capsule TAKE ONE CAPSULE BY MOUTH EVERY DAY  . fluticasone (FLOVENT HFA) 110 MCG/ACT inhaler Inhale 2 puffs into the lungs 2 (two) times daily.  Marland Kitchen lamoTRIgine (LAMICTAL) 25 MG tablet Take one tablet at day x 14 days. Then start two tablets a day for 14 days.  Marland Kitchen losartan-hydrochlorothiazide (HYZAAR) 100-25 MG per tablet Take 1 tablet by mouth daily.  Marland Kitchen venlafaxine XR (EFFEXOR-XR) 75 MG 24 hr capsule Take 3 capsules (225 mg total) by mouth daily with breakfast.  . VENTOLIN HFA 108 (90 BASE) MCG/ACT inhaler INHALE 4 PUFFS INTO THE LUNGS 2 TIMES DAILY AS NEEDED.  Marland Kitchen triamcinolone (KENALOG) 0.1 % paste Use as directed 1 application in the mouth or throat 3 (three) times daily. (Patient not taking: Reported on 04/16/2014)    Past Psychiatric History/Hospitalization(s): Anxiety: Yes Bipolar Disorder: No Depression: Yes Mania:  No Psychosis: No Schizophrenia: No Personality Disorder: No Hospitalization for psychiatric illness: No History of Electroconvulsive Shock Therapy: No Prior Suicide Attempts: No  Physical Exam: Constitutional:  There were no vitals taken for this visit.  General Appearance: alert, oriented, no acute distress  Musculoskeletal: Strength & Muscle Tone: within normal limits Gait & Station: normal Patient leans: N/A  Psychiatric: Speech (describe rate, volume, coherence, spontaneity, and abnormalities if any): normal  Thought Process (describe rate, content, abstract reasoning, and computation): normal  Associations: Coherent and Relevant  Thoughts: pervasive negative thoughts  Mental Status: Orientation: oriented to person, place and time/date Mood & Affect: depressed affect, anxiety and agitation Attention Span & Concentration: fair  Medical Decision Making (Choose Three): Established Problem, Worsening (2)  Assessment: Axis I: MDD             GAD             Non compliance             hypomanic Plan:  1. Increase Lamictal to 100mg  at hs. 2. Continue Effexor as written. 3. Low dose trazodone for sleep. 4. Follow up in 2 weeks.  Washington Whedbee, PA-C 04/16/2014

## 2014-04-26 ENCOUNTER — Ambulatory Visit (INDEPENDENT_AMBULATORY_CARE_PROVIDER_SITE_OTHER): Payer: BLUE CROSS/BLUE SHIELD | Admitting: Family Medicine

## 2014-04-26 ENCOUNTER — Encounter: Payer: Self-pay | Admitting: Family Medicine

## 2014-04-26 VITALS — BP 147/82 | HR 85 | Temp 98.1°F | Wt 259.0 lb

## 2014-04-26 DIAGNOSIS — M7021 Olecranon bursitis, right elbow: Secondary | ICD-10-CM

## 2014-04-26 DIAGNOSIS — J4531 Mild persistent asthma with (acute) exacerbation: Secondary | ICD-10-CM

## 2014-04-26 MED ORDER — AZITHROMYCIN 250 MG PO TABS: ORAL_TABLET | ORAL | Status: AC

## 2014-04-26 MED ORDER — PREDNISONE 20 MG PO TABS: ORAL_TABLET | ORAL | Status: AC

## 2014-04-26 MED ORDER — BENZONATATE 100 MG PO CAPS: 100.0000 mg | ORAL_CAPSULE | Freq: Two times a day (BID) | ORAL | Status: DC | PRN

## 2014-04-26 NOTE — Progress Notes (Signed)
CC: Brooke Kaiser is a 44 y.o. female is here for Nasal Congestion and Cough   Subjective: HPI:  Productive cough that began about 5 days ago accompanied by facial pressure in the forehead. Cough is moderate severity present all hours of the day but slightly improved with albuterol which she is using 2 puffs twice a day. Denies blood in sputum. Has been also accompanied by worsening wheezing beyond her baseline wheezing. She is uncertain about shortness of breath. She denies chest pain. She's tried over-the-counter cough medicine without much benefit for all of her symptoms. Other than above nothing seems to make symptoms better or worse. Denies fevers, chills, sore throat, nasal congestion, ocular complaints, no motor or sensory disturbances.  Complains of right elbow pain localized on the back of the elbow that is present only when she is applying pressure to the back of the elbow. Full range of motion and strength without any subjective limitations. Symptoms began soon after she fell from standing position onto her right elbow while restraining a child. Symptoms have been persistent ever since now mild in severity but resolution of pain seems to have plateaued. No interventions other than time. Denies swelling redness or warmth at the site of discomfort. It radiates a couple inches down the forearm.   Review Of Systems Outlined In HPI  Past Medical History  Diagnosis Date  . Abnormal thyroid blood test   . Hypertension   . Anemia   . Depression   . Anxiety   . Tobacco abuse     Past Surgical History  Procedure Laterality Date  . 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 Cancer Mother 41    Breast  . Colon polyps Mother   . Other Mother     colon polyps  . Dementia Mother   . Anxiety disorder Mother   . Depression Mother   . Paranoid behavior Mother   . Depression Father   . Suicidality Father     commited  suicide  . Alcohol abuse Father   . Colon polyps Maternal Aunt     cancerous colon polyps  . Cancer Maternal Uncle     cancerous colon polyps  . Heart disease Other   . Other Other     thyroid disorder  . Alcohol abuse Paternal Uncle   . Dementia Maternal Grandmother   . Alcohol abuse Paternal Grandmother   . Alcohol abuse Paternal Uncle     History   Social History  . Marital Status: Single    Spouse Name: N/A  . Number of Children: N/A  . Years of Education: N/A   Occupational History  . Not on file.   Social History Main Topics  . Smoking status: Current Every Day Smoker -- 1.00 packs/day for 27 years    Types: Cigarettes  . Smokeless tobacco: Never Used  . Alcohol Use: 0.0 oz/week    0.25 drink(s) per week  . Drug Use: No  . Sexual Activity:    Partners: Female     Comment: prefers female sex partner   Other Topics Concern  . Not on file   Social History Narrative     Objective: BP 147/82 mmHg  Pulse 85  Temp(Src) 98.1 F (36.7 C) (Oral)  Wt 259 lb (117.482 kg)  SpO2 98%  General: Alert and Oriented, No Acute Distress HEENT: Pupils equal, round, reactive to light. Conjunctivae clear.  External ears unremarkable, canals clear with intact TMs with appropriate landmarks.  Middle ear appears open without effusion. Pink inferior turbinates.  Moist mucous membranes, pharynx without inflammation nor lesions.  Neck supple without palpable lymphadenopathy nor abnormal masses. Lungs: comfortable work of breathing with trace and Axert for wheezing without rhonchi nor rales. Cardiac: Regular rate and rhythm. Normal S1/S2.  No murmurs, rubs, nor gallops.   Extremities: No peripheral edema.  Strong peripheral pulses. Full range of motion and strength of the right elbow. No pain with compression of ulnar and radius. No pain with resisted supination or pronation. No pain at the medial or lateral epicondyles or pain with valgus or varus stress. Pain is reproduced with  palpation of a small quarter centimeter diameter soft mobile nodule overlying the olecranon Mental Status: No depression, anxiety, nor agitation. Skin: Warm and dry.  Assessment & Plan: Yared was seen today for nasal congestion and cough.  Diagnoses and all orders for this visit:  Asthma with exacerbation, mild persistent Orders: -     azithromycin (ZITHROMAX) 250 MG tablet; Take two tabs at once on day 1, then one tab daily on days 2-5. -     predniSONE (DELTASONE) 20 MG tablet; Three tabs daily days 1-3, two tabs daily days 4-6, one tab daily days 7-9, half tab daily days 10-13.  Olecranon bursitis, right  Other orders -     benzonatate (TESSALON) 100 MG capsule; Take 1 capsule (100 mg total) by mouth 2 (two) times daily as needed for cough.   Asthma exacerbation: continue albuterol however now schedule 2 puffs every 6 hours for the next 48 hours then on an as-needed basis. Begin prednisone taper and azithromycin. Tessalon Perles per her request which seems reasonable Olecranon bursitis: Discuss compression and anti-inflammatories for the next 3 weeks and if no better I would be happy to refer her to our [facility] medicine[facility] for a second opinion. Return if symptoms worsen or fail to improve.

## 2014-05-04 ENCOUNTER — Ambulatory Visit (INDEPENDENT_AMBULATORY_CARE_PROVIDER_SITE_OTHER): Payer: BLUE CROSS/BLUE SHIELD | Admitting: Physician Assistant

## 2014-05-04 ENCOUNTER — Encounter (HOSPITAL_COMMUNITY): Payer: Self-pay | Admitting: Physician Assistant

## 2014-05-04 VITALS — BP 140/70 | HR 99 | Ht 68.5 in | Wt 259.0 lb

## 2014-05-04 DIAGNOSIS — F331 Major depressive disorder, recurrent, moderate: Secondary | ICD-10-CM | POA: Diagnosis not present

## 2014-05-04 DIAGNOSIS — F411 Generalized anxiety disorder: Secondary | ICD-10-CM | POA: Diagnosis not present

## 2014-05-04 MED ORDER — LAMOTRIGINE 100 MG PO TABS: ORAL_TABLET | ORAL | Status: DC

## 2014-05-04 NOTE — Patient Instructions (Signed)
1. Take medications as written. 2. Follow up in 6 weeks.

## 2014-05-04 NOTE — Progress Notes (Signed)
  [facility] [phone] Progress Note  Brooke Kaiser [id number] 44 y.o.  05/04/2014 1:46 PM  Chief Complaint:  Depression and anxiety History of Present Illness: Patient notes that she has improved since she has restarted the Lamictal. She notes that the anger has decreased significantly with it. She doesn't even know what she is angry about but it is much better.  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  She has two follow ups with [employer].    Suicidal Ideation: No Plan Formed: No Patient has means to carry out plan: No  Homicidal Ideation: No Plan Formed: No Patient has means to carry out plan: No  Review of Systems: Psychiatric: Agitation: none Hallucination: No Depressed Mood: not one ounce! Insomnia: did not like the Trazodone Hypersomnia: No Altered Concentration: No Feels Worthless: No Grandiose Ideas: No Belief In Special Powers: No New/Increased Substance Abuse: No Compulsions: No  Neurologic: Headache: Yes Seizure: No Paresthesias: Yes  Past Medical Family, Social History:  Recent job loss <2 weeks.  Outpatient Encounter Prescriptions as of 04/16/2014  Medication Sig  . ALPRAZolam (XANAX) 0.5 MG tablet Take 1 tablet (0.5 mg total) by mouth daily as needed for anxiety. For anxiety  . DEXILANT 60 MG capsule TAKE ONE CAPSULE BY MOUTH EVERY DAY  . fluticasone (FLOVENT HFA) 110 MCG/ACT inhaler Inhale 2 puffs into the lungs 2 (two) times daily.  Marland Kitchen lamoTRIgine (LAMICTAL) 25 MG tablet Take one tablet at day x 14 days. Then start two tablets a day for 14 days.  Marland Kitchen losartan-hydrochlorothiazide (HYZAAR) 100-25 MG per tablet Take 1 tablet by mouth daily.  Marland Kitchen venlafaxine XR (EFFEXOR-XR) 75 MG 24 hr capsule Take 3 capsules (225 mg total) by mouth daily with breakfast.  . VENTOLIN HFA 108 (90 BASE) MCG/ACT inhaler INHALE 4 PUFFS INTO THE LUNGS 2 TIMES DAILY AS NEEDED.  Marland Kitchen  triamcinolone (KENALOG) 0.1 % paste Use as directed 1 application in the mouth or throat 3 (three) times daily. (Patient not taking: Reported on 04/16/2014)    Past Psychiatric History/Hospitalization(s): Anxiety: Yes Bipolar Disorder: No Depression: Yes Mania: No Psychosis: No Schizophrenia: No Personality Disorder: No Hospitalization for psychiatric illness: No History of Electroconvulsive Shock Therapy: No Prior Suicide Attempts: No  Physical Exam: Constitutional:  BP 140/70 mmHg  Pulse 99  Ht 5' 8.5" (1.74 m)  Wt 259 lb (117.482 kg)  BMI 38.80 kg/m2  General Appearance: alert, oriented, no acute distress  Musculoskeletal: Strength & Muscle Tone: within normal limits Gait & Station: normal Patient leans: N/A  Psychiatric: Speech (describe rate, volume, coherence, spontaneity, and abnormalities if any): normal  Thought Process (describe rate, content, abstract reasoning, and computation): normal  Associations: Coherent and Relevant  Thoughts: pervasive negative thoughts  Mental Status: Orientation: oriented to person, place and time/date Mood & Affect: depressed affect, anxiety and agitation Attention Span & Concentration: fair  Medical Decision Making (Choose Three): Established Problem, Worsening (2)  Assessment: Axis I: MDD            GAD                          Plan:  1. Continue Lamictal at 100mg  po qd. 2. Continue Effexor as written. 3.  Follow up in 6 weeks. Meds refilled as written. Pansie Guggisberg, PA-C 05/04/2014

## 2014-05-10 ENCOUNTER — Encounter: Payer: Self-pay | Admitting: Physician Assistant

## 2014-05-10 ENCOUNTER — Ambulatory Visit (INDEPENDENT_AMBULATORY_CARE_PROVIDER_SITE_OTHER): Payer: BLUE CROSS/BLUE SHIELD | Admitting: Physician Assistant

## 2014-05-10 VITALS — BP 136/87 | HR 86 | Temp 98.3°F | Wt 261.0 lb

## 2014-05-10 DIAGNOSIS — J209 Acute bronchitis, unspecified: Secondary | ICD-10-CM | POA: Diagnosis not present

## 2014-05-10 MED ORDER — PREDNISONE 50 MG PO TABS: ORAL_TABLET | ORAL | Status: DC

## 2014-05-10 MED ORDER — DOXYCYCLINE HYCLATE 100 MG PO TABS: 100.0000 mg | ORAL_TABLET | Freq: Two times a day (BID) | ORAL | Status: DC

## 2014-05-10 MED ORDER — HYDROCODONE-HOMATROPINE 5-1.5 MG/5ML PO SYRP: 5.0000 mL | ORAL_SOLUTION | Freq: Every evening | ORAL | Status: DC | PRN

## 2014-05-10 NOTE — Patient Instructions (Signed)

## 2014-05-10 NOTE — Progress Notes (Signed)
   Subjective:    Patient ID: Brooke Kaiser, female    DOB: [birth year], 44 y.o.   MRN: [id number]  HPI  Pt presents to the clinic with 36 hours of straight coughing. Pt is a smoker but not on any daily inhalers. She is very tight in chest with wheezing. No known fever but had chills. Using mucines with OTC cough syrup. Tessalon pearles not touching the cough. Sinus pressure and ear pain are present. Hx of bronchitis once or twice a year.      Review of Systems  All other systems reviewed and are negative.      Objective:   Physical Exam  Constitutional: She is oriented to person, place, and time. She appears well-developed and well-nourished.  HENT:  Head: Normocephalic and atraumatic.  Right Ear: External ear normal.  Left Ear: External ear normal.  Nose: Nose normal.  Mouth/Throat: Oropharynx is clear and moist. No oropharyngeal exudate.  Eyes: Conjunctivae are normal. Right eye exhibits no discharge. Left eye exhibits discharge.  Neck: Normal range of motion. Neck supple.  Cardiovascular: Normal rate, regular rhythm and normal heart sounds.   Pulmonary/Chest: Effort normal.  Bilateral scattered lung wheezing.   Lymphadenopathy:    She has no cervical adenopathy.  Neurological: She is alert and oriented to person, place, and time.  Skin: Skin is dry.  Psychiatric: She has a normal mood and affect. Her behavior is normal.          Assessment & Plan:  Acute bronchitis-pulse ox reassuring. Hx of asthmatic bronchitis. Treated with doxy for 10 day. prednione and hycodan for bedtime cough. Gave HO for other symptomatic care.

## 2014-05-23 ENCOUNTER — Other Ambulatory Visit (HOSPITAL_COMMUNITY): Payer: Self-pay | Admitting: Physician Assistant

## 2014-05-27 ENCOUNTER — Telehealth (HOSPITAL_COMMUNITY): Payer: Self-pay | Admitting: *Deleted

## 2014-05-27 DIAGNOSIS — F331 Major depressive disorder, recurrent, moderate: Secondary | ICD-10-CM

## 2014-05-27 MED ORDER — VENLAFAXINE HCL ER 75 MG PO CP24: 225.0000 mg | ORAL_CAPSULE | Freq: Every day | ORAL | Status: DC

## 2014-05-27 NOTE — Telephone Encounter (Signed)
Received fax from pharmacy for a refill for venlafaxine XR (EFFEXOR-XR) 75 MG 24 hr capsule. Per Nena Polio, PA-C, pt is authorized for a refill for venlafaxine XR (EFFEXOR-XR) 75 MG 24 hr capsule , Qty 90. Prescription was sent to pharmacy.  Pt has a f/u appt on 5/20. Called and informed pt of prescription status. Pt states and shows understanding.

## 2014-06-18 ENCOUNTER — Ambulatory Visit (HOSPITAL_COMMUNITY): Payer: Self-pay | Admitting: Licensed Clinical Social Worker

## 2014-06-18 ENCOUNTER — Ambulatory Visit (HOSPITAL_COMMUNITY): Payer: Self-pay | Admitting: Physician Assistant

## 2014-07-09 ENCOUNTER — Ambulatory Visit (INDEPENDENT_AMBULATORY_CARE_PROVIDER_SITE_OTHER): Payer: Self-pay | Admitting: Family Medicine

## 2014-07-09 ENCOUNTER — Encounter: Payer: Self-pay | Admitting: Family Medicine

## 2014-07-09 VITALS — BP 119/78 | HR 76 | Wt 245.0 lb

## 2014-07-09 DIAGNOSIS — R7301 Impaired fasting glucose: Secondary | ICD-10-CM

## 2014-07-09 DIAGNOSIS — I10 Essential (primary) hypertension: Secondary | ICD-10-CM

## 2014-07-09 DIAGNOSIS — M7021 Olecranon bursitis, right elbow: Secondary | ICD-10-CM

## 2014-07-09 LAB — POCT GLYCOSYLATED HEMOGLOBIN (HGB A1C): HEMOGLOBIN A1C: 6.1

## 2014-07-09 NOTE — Progress Notes (Signed)
   Subjective:    Patient ID: Brooke Kaiser, female    DOB: 30-Mar-1970, 44 y.o.   MRN: [id number]  HPI IFG - No inc thrirst.  No regular exercise. She has lost 16 lbs in 2 months.  She has been using [employer].    Hypertension- Pt denies chest pain, SOB, dizziness, or heart palpitations.  Taking meds as directed w/o problems.  Denies medication side effects.    Right elbow is swollen and spongy feeling. No major pain. Still tender. intial inury was in December. Says it was red and painful before but was never really swollen until today.     Review of Systems     Objective:   Physical Exam  Constitutional: She is oriented to person, place, and time. She appears well-developed and well-nourished.  HENT:  Head: Normocephalic and atraumatic.  Cardiovascular: Normal rate, regular rhythm and normal heart sounds.   Pulmonary/Chest: Effort normal and breath sounds normal.  Musculoskeletal:  Right elbow with normal flexion extension. The bursa is swollen.  nontender and no erythema.   Neurological: She is alert and oriented to person, place, and time.  Skin: Skin is warm and dry.  Psychiatric: She has a normal mood and affect. Her behavior is normal.          Assessment & Plan:  IFG  - Up from last time. A1C is up to 6.2. F/U in 6 months. Cut out ice cream and sugary drink.   HTN - well controlled.  F/U in 6 months.   Olecranon bursitis-recommend compression with an Ace wrap. Wrapped here in the office today. Recommend anti-inflammatory. If it's not improving over the next few weeks then consider drainage of the bursa.

## 2014-07-28 ENCOUNTER — Other Ambulatory Visit (HOSPITAL_COMMUNITY): Payer: Self-pay | Admitting: Physician Assistant

## 2014-07-29 ENCOUNTER — Ambulatory Visit (INDEPENDENT_AMBULATORY_CARE_PROVIDER_SITE_OTHER): Payer: Self-pay | Admitting: Physician Assistant

## 2014-07-29 ENCOUNTER — Encounter (HOSPITAL_COMMUNITY): Payer: Self-pay | Admitting: Physician Assistant

## 2014-07-29 VITALS — BP 114/93 | HR 85 | Ht 68.2 in | Wt 240.5 lb

## 2014-07-29 DIAGNOSIS — F411 Generalized anxiety disorder: Secondary | ICD-10-CM

## 2014-07-29 DIAGNOSIS — F331 Major depressive disorder, recurrent, moderate: Secondary | ICD-10-CM

## 2014-07-29 MED ORDER — LAMOTRIGINE 100 MG PO TABS: ORAL_TABLET | ORAL | Status: DC

## 2014-07-29 MED ORDER — VENLAFAXINE HCL ER 75 MG PO CP24: 225.0000 mg | ORAL_CAPSULE | Freq: Every day | ORAL | Status: DC

## 2014-07-29 NOTE — Patient Instructions (Signed)
1. Continue all medication as ordered. 2. Call this office if you have any questions or concerns. 3. Continue to get regular exercise 3-5 times a week. 4. Continue to eat a healthy nutritionally balanced diet. 5. Continue to reduce stress and anxiety through activities such as yoga, mindfulness, meditation and or prayer. 6. Keep all appointments with your out patient therapist and have notes forwarded to this office. (If you do not have one and would like to be scheduled with a therapist, please let our office assist you with this. 7. Follow up as planned in 3 months.  Evidence based medicine supports that taking these two supplements will improve your overall mental health.  *Vitamin D3 (5000 i.u.) take one per day. *B complex take one per day.

## 2014-07-29 NOTE — Progress Notes (Signed)
[facility] MD Progress Note  07/29/2014 8:50 AM Brooke Kaiser  MRN:  [id number] Subjective:  Brooke Kaiser is in to follow up on her GAD and MDD. She has been compliant with her medication. She is still job hunting and states that she now has no insurance and that her medications are expensive.   Her depression is pretty stable she notes she only has symptoms about 20 percent of the time and she notes that it is situational. This includes sadness, poor motivation, irritable and angry, but usually when her mother is in town.  Her anxiety is pretty stable, unless her mother is visiting. Usually she can override the situation, but she notes very few problems.   Principal Problem: GAD and MDD Diagnosis:   Patient Active Problem List   Diagnosis Date Noted  . Gouty arthritis [M10.09] 11/04/2013  . ANA positive [R76.8] 09/22/2013  . Ache in joint [M25.50] 09/22/2013  . Muscle ache [M79.1] 09/22/2013  . Collagen disease [M35.9] 09/22/2013  . Elevated erythrocyte sedimentation rate [R70.0] 09/22/2013  . IFG (impaired fasting glucose) [R73.01] 06/24/2012  . Asthmatic bronchitis [J45.909] 10/16/2011  . GAD (generalized anxiety disorder) [F41.1] 07/30/2011  . HYPERTENSION [I10] 04/07/2010  . UNSPECIFIED ANEMIA [D64.9] 03/29/2010  . Major depressive disorder, recurrent episode, severe, without mention of psychotic behavior [F33.2] 07/22/2009  . OTHER ABNORMAL GLUCOSE [R73.09] 05/25/2008  . HYPERTRIGLYCERIDEMIA [E78.1] 11/04/2007  . FATIGUE [R53.81, R53.83] 11/04/2007  . EXTERNAL HEMORRHOIDS [K64.8] 07/22/2007  . BACK PAIN [M54.9] 07/22/2007  . TOBACCO ABUSE [Z72.0] 07/01/2007  . GERD [K21.9] 07/01/2007  . ABNORMAL THYROID FUNCTION TESTS [R94.6] 04/22/2007   Total Time spent with patient: 64minutes   Past Medical History:  Past Medical History  Diagnosis Date  . Abnormal thyroid blood test   . Hypertension   . Anemia   . Depression   . Anxiety   . Tobacco abuse     Past Surgical History   Procedure Laterality Date  . 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 Cancer Mother 81    Breast  . Colon polyps Mother   . Other Mother     colon polyps  . Dementia Mother   . Anxiety disorder Mother   . Depression Mother   . Paranoid behavior Mother   . Depression Father   . Suicidality Father     commited suicide  . Alcohol abuse Father   . Colon polyps Maternal Aunt     cancerous colon polyps  . Cancer Maternal Uncle     cancerous colon polyps  . Heart disease Other   . Other Other     thyroid disorder  . Alcohol abuse Paternal Uncle   . Dementia Maternal Grandmother   . Alcohol abuse Paternal Grandmother   . Alcohol abuse Paternal Uncle    Social History:  History  Alcohol Use  . 0.0 oz/week  . 0.25 drink(s) per week     History  Drug Use No    History   Social History  . Marital Status: Single    Spouse Name: N/A  . Number of Children: N/A  . Years of Education: N/A   Social History Main Topics  . Smoking status: Current Every Day Smoker -- 1.00 packs/day for 27 years    Types: Cigarettes  . Smokeless tobacco: Never Used  . Alcohol Use: 0.0 oz/week    0.25 drink(s) per week  .  Drug Use: No  . Sexual Activity:    Partners: Female     Comment: prefers female sex partner   Other Topics Concern  . None   Social History Narrative   Additional History:    Sleep: Fair  Appetite:  Fair   Assessment: GAD stable, MDD stable   Musculoskeletal: Strength & Muscle Tone: within normal limits Gait & Station: normal Patient leans: N/A   Psychiatric Specialty Exam: Physical Exam  ROS  Blood pressure 114/93, pulse 85, height 5' 8.2" (1.732 m), weight 240 lb 8 oz (109.09 kg).Body mass index is 36.37 kg/(m^2).  General Appearance: Neat and Well Groomed  Engineer, water::  Good  Speech:  Clear and Coherent  Volume:  Normal  Mood:  Euthymic  Affect:  Congruent   Thought Process:  Goal Directed, Intact, Linear and Logical  Orientation:  Full (Time, Place, and Person)  Thought Content:  WDL  Suicidal Thoughts:  No  Homicidal Thoughts:  No  Memory:  Immediate;   Good Recent;   Good Remote;   Good  Judgement:  Good  Insight:  Present  Psychomotor Activity:  Normal  Concentration:  Good  Recall:  Good  Fund of Knowledge:Good  Language: Good  Akathisia:  No  Handed:  Right  AIMS (if indicated):     Assets:  Communication Skills Desire for Improvement Housing Intimacy Leisure Time Physical Health Resilience Social Support Talents/Skills Transportation Vocational/Educational  ADL's:  Intact  Cognition: WNL  Sleep:  Fair  Notes she is sleeping during the day.     Current Medications: Current Outpatient Prescriptions  Medication Sig Dispense Refill  . lamoTRIgine (LAMICTAL) 100 MG tablet Take one tablet at day for mood stabilization. 30 tablet 1  . losartan-hydrochlorothiazide (HYZAAR) 100-25 MG per tablet Take 1 tablet by mouth daily. 30 tablet 5  . venlafaxine XR (EFFEXOR-XR) 75 MG 24 hr capsule Take 3 capsules (225 mg total) by mouth daily with breakfast. 90 capsule 0  . VENTOLIN HFA 108 (90 BASE) MCG/ACT inhaler INHALE 4 PUFFS INTO THE LUNGS 2 TIMES DAILY AS NEEDED. 18 Inhaler 3   No current facility-administered medications for this visit.    Lab Results: No results found for this or any previous visit (from the past 48 hour(s)).  Physical Findings: AIMS: CIWA:   COWS:    Treatment Plan Summary: Medication management  Medical Decision Making:  Established Problem, Stable/Improving (1) MDD 1. Will continue Lamictal as written x 3 months. 2. Gave patient information re: Needymeds.org to help with costs. 3. Cautioned the patient about skipping Lamictal.  GAD 1. Will continue Effexor XR at 225mg  po qd for generic. 2. Also information regarding Need[employer]eds. 3. Will write for 3 months as noted.  Encouraged patient to  return to school to further her education and motivated her to get a job. Follow up in 3 months with Darlyne Russian.   Marlane Hatcher. Jacie Tristan RPAC 9:19 AM 07/29/2014

## 2014-09-07 ENCOUNTER — Other Ambulatory Visit: Payer: Self-pay | Admitting: Family Medicine

## 2014-10-25 ENCOUNTER — Ambulatory Visit (HOSPITAL_COMMUNITY): Payer: Self-pay | Admitting: Medical

## 2014-12-03 ENCOUNTER — Other Ambulatory Visit (HOSPITAL_COMMUNITY): Payer: Self-pay | Admitting: Physician Assistant

## 2014-12-07 NOTE — Telephone Encounter (Signed)
Received medication request from [facility] for Effexor 75mg . Per Darlyne Russian, medication request is denied. Pt will need to schedule an appt with clinic for medication management.

## 2014-12-08 IMAGING — CR DG CHEST 2V
2 series · 2 of 2 positions shown · non-contrast
Comparison: 03/28/2012; 06/10/2009; 07/22/2007

ADDENDUM:
The following is a correction to the above impression.

1. Mild lung hyperexpansion and bronchitic change WITHOUT acute
cardiopulmonary disease.
2. Stable postsurgical change of the left hilum could
CLINICAL DATA: Chest pain, shortness of breath, left arm pain,
history of smoking
EXAM:
CHEST  2 VIEW

[w chest pa]
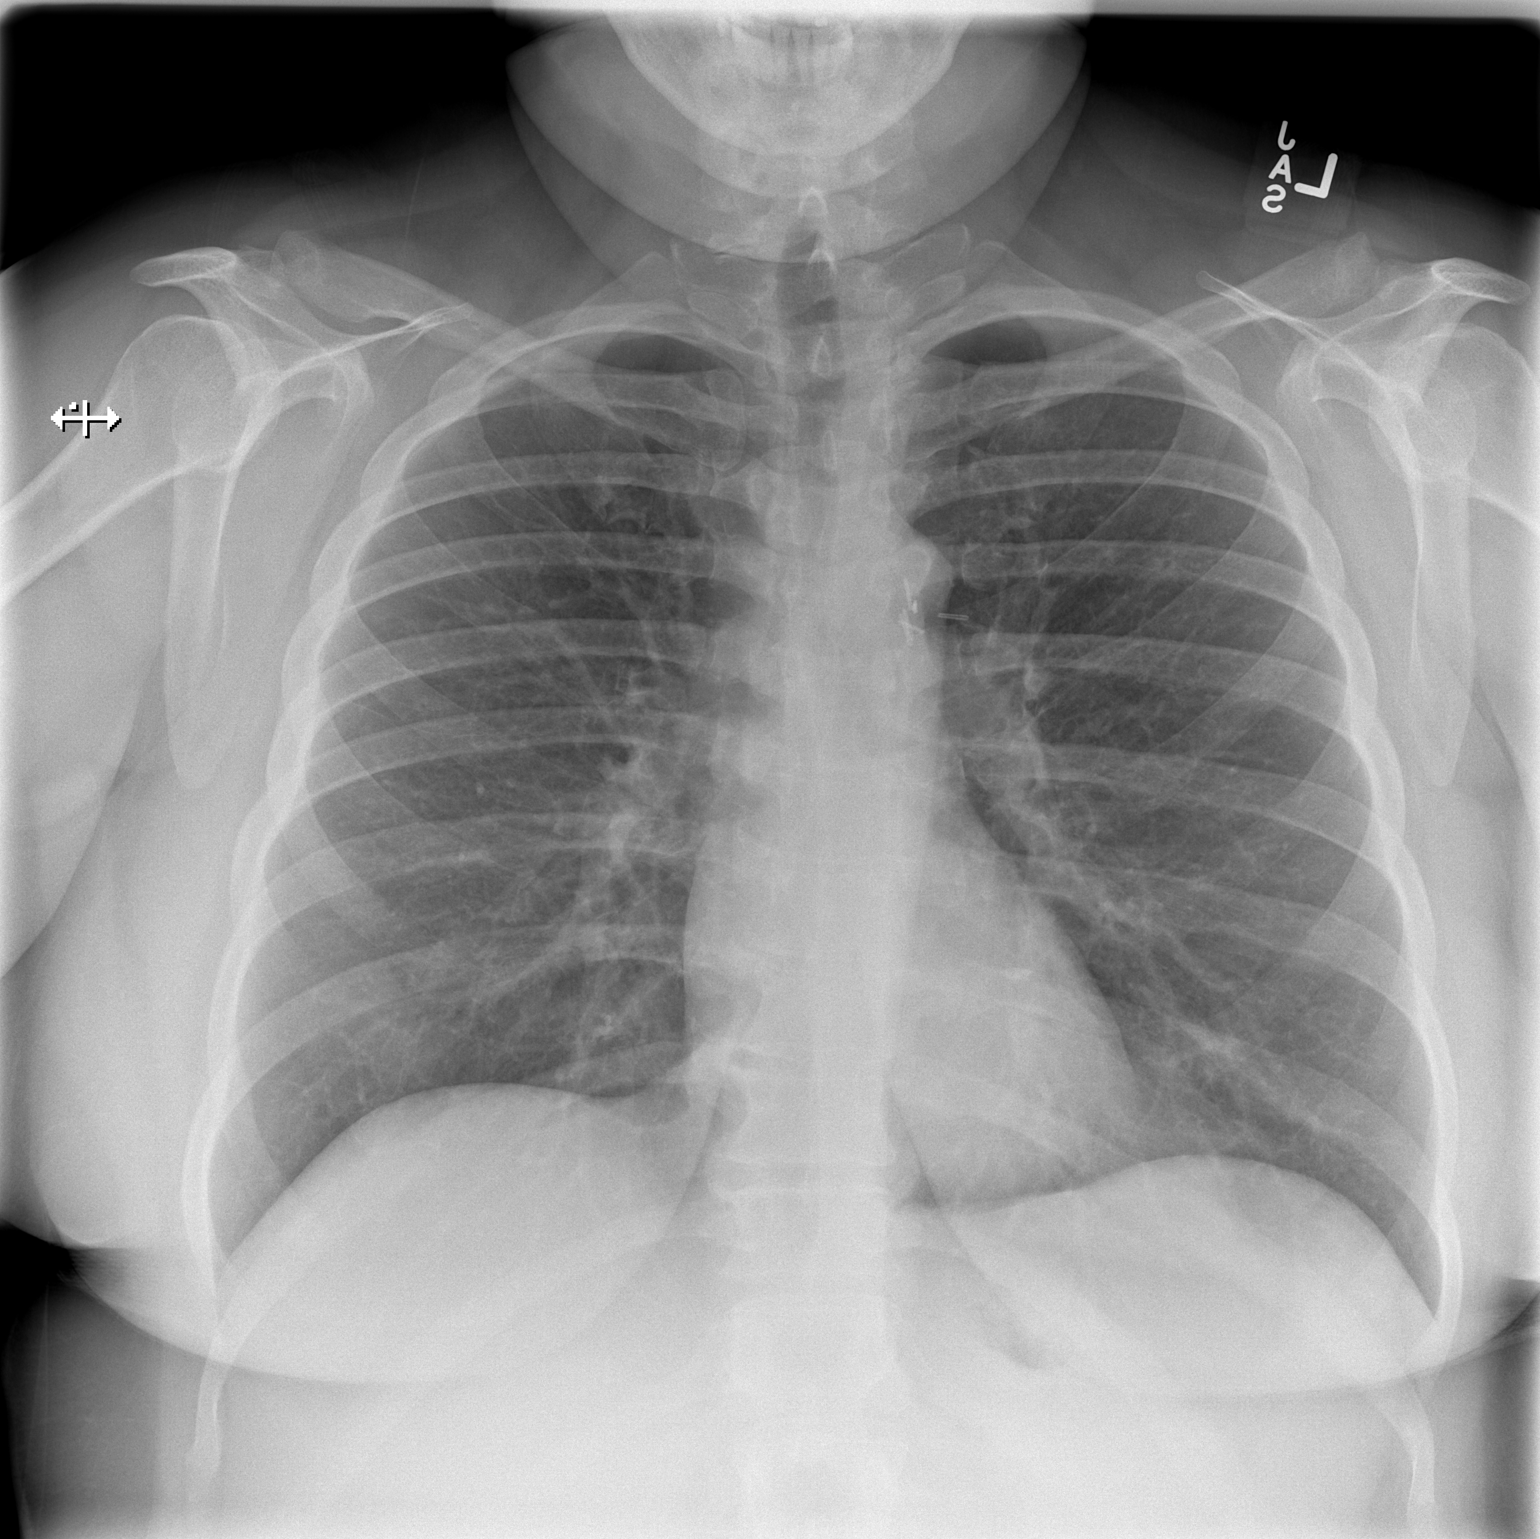

[w chest lat]
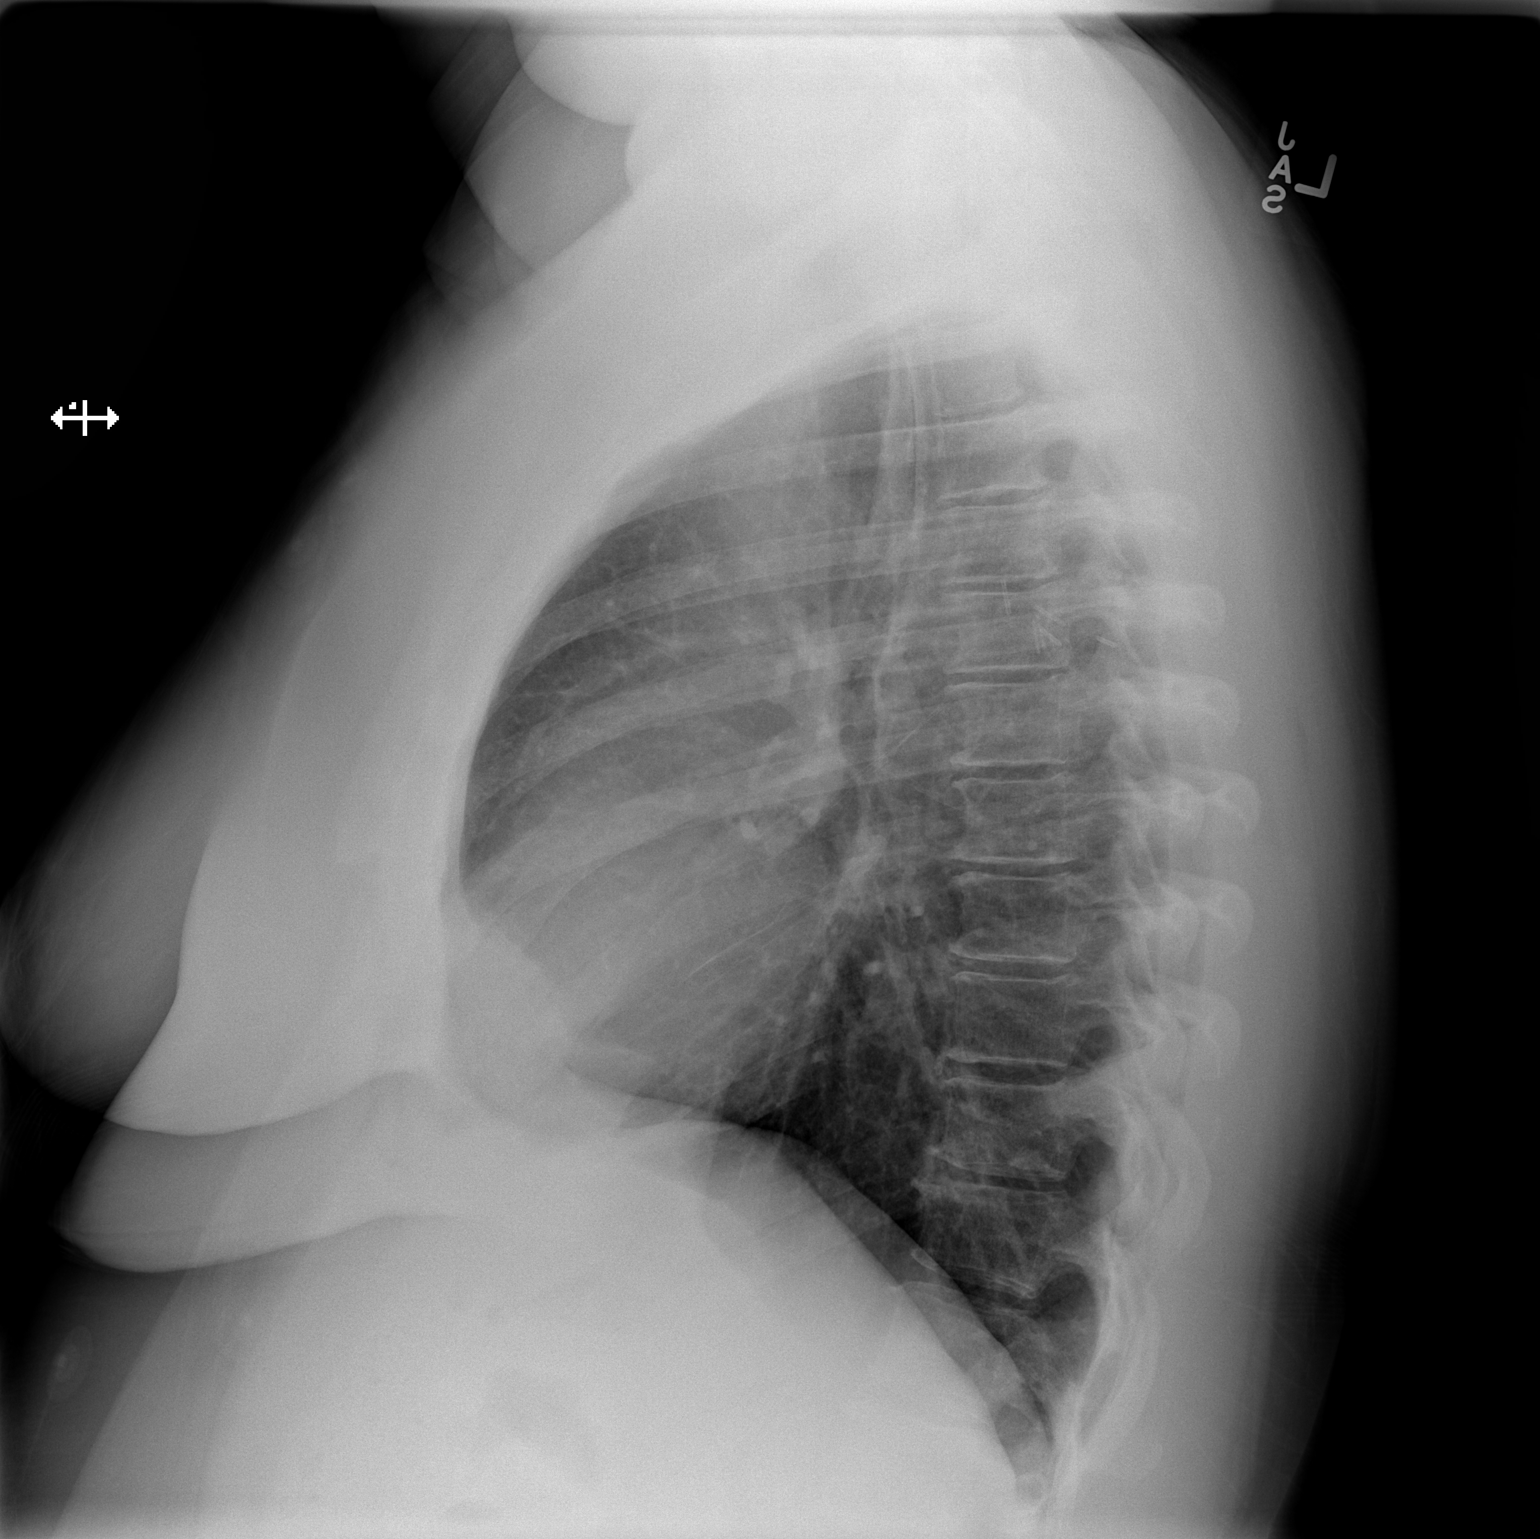

[2 of 2 positions shown; findings below may reference images not displayed]

FINDINGS: Grossly unchanged cardiac silhouette and mediastinal contours with
postsurgical change of the left superior hilum. The lungs appear
hyperexpanded with mild diffuse slightly nodular thickening of the
pulmonary interstitium. Grossly unchanged minimal left basilar
linear heterogeneous opacities favored to represent atelectasis or
scar. No new focal airspace opacities. No definite pleural effusion
or pneumothorax. No definite evidence of edema. Grossly unchanged
bones including degenerative change of the lower lumbar spine.
IMPRESSION: 1. Mild lung hyperexpansion and bronchitic change acute
cardiopulmonary disease.
2. Stable postsurgical change of the left hilum.

## 2014-12-24 ENCOUNTER — Other Ambulatory Visit (HOSPITAL_COMMUNITY): Payer: Self-pay | Admitting: Physician Assistant

## 2014-12-27 NOTE — Telephone Encounter (Signed)
Brooke Kaiser [phone]  [name] called to say that the pharmacy would be calling to get refills for her and she also needs an appointment.

## 2014-12-29 ENCOUNTER — Telehealth (HOSPITAL_COMMUNITY): Payer: Self-pay

## 2014-12-30 NOTE — Telephone Encounter (Signed)
Pt called to request a refill for Effexor 75mg . Pt was last seen in June 2016. Pt states she is out of medication and will need enough until her next appt on 01/27/15.

## 2014-12-31 ENCOUNTER — Telehealth (HOSPITAL_COMMUNITY): Payer: Self-pay | Admitting: *Deleted

## 2014-12-31 DIAGNOSIS — F331 Major depressive disorder, recurrent, moderate: Secondary | ICD-10-CM

## 2014-12-31 MED ORDER — VENLAFAXINE HCL ER 75 MG PO CP24: 225.0000 mg | ORAL_CAPSULE | Freq: Every day | ORAL | Status: DC

## 2014-12-31 NOTE — Telephone Encounter (Signed)
Pt called for a medication refill for Effexor 75mg . Per Dr. De Nurse, pt is authorized for a refill for Effexor 75mg , #15. Prescription was sent to pharmacy. Called and informed pt the medication refill will only be enough until next appt. Pt has a f/u appt on 01/05/15 with Dr. De Nurse. Pt verbalizes understanding.

## 2015-01-01 ENCOUNTER — Other Ambulatory Visit (HOSPITAL_COMMUNITY): Payer: Self-pay | Admitting: Psychiatry

## 2015-01-02 ENCOUNTER — Other Ambulatory Visit: Payer: Self-pay | Admitting: Medical

## 2015-01-02 NOTE — Telephone Encounter (Signed)
ok 

## 2015-01-03 NOTE — Telephone Encounter (Signed)
Received medication request for Effexor 75mg  from [facility]. Per Dr. De Nurse, medication request is denied. PT received prescription for a 5 day supply on 12/31/14. Pt is schedule for a f/u appt on 01/05/15.

## 2015-01-05 ENCOUNTER — Encounter (HOSPITAL_COMMUNITY): Payer: Self-pay | Admitting: Psychiatry

## 2015-01-05 ENCOUNTER — Ambulatory Visit (INDEPENDENT_AMBULATORY_CARE_PROVIDER_SITE_OTHER): Payer: PRIVATE HEALTH INSURANCE | Admitting: Psychiatry

## 2015-01-05 VITALS — BP 130/70 | HR 90 | Ht 68.5 in | Wt 244.0 lb

## 2015-01-05 DIAGNOSIS — F331 Major depressive disorder, recurrent, moderate: Secondary | ICD-10-CM

## 2015-01-05 DIAGNOSIS — R454 Irritability and anger: Secondary | ICD-10-CM

## 2015-01-05 DIAGNOSIS — F411 Generalized anxiety disorder: Secondary | ICD-10-CM

## 2015-01-05 MED ORDER — VENLAFAXINE HCL ER 75 MG PO CP24: 225.0000 mg | ORAL_CAPSULE | Freq: Every day | ORAL | Status: DC

## 2015-01-05 MED ORDER — LAMOTRIGINE 100 MG PO TABS: ORAL_TABLET | ORAL | Status: DC

## 2015-01-05 NOTE — Progress Notes (Signed)
Patient ID: Brooke Kaiser, female   DOB: [birth year], 44 y.o.   MRN: [id number] [facility] MD Progress Note  01/05/2015 11:27 AM Brooke Kaiser  MRN:  [id number] Subjective:  Brooke Kaiser is in to follow up on her GAD and MDD. She has been compliant with her medication and has seen Lovenia Shuck before.   Patient lives with her girlfriend. Initially started treatment for 5 years ago when she was stressed out extreme anxiety. She was self cutting. No hospitalization was done but she started on medication. Effexor has been helpful for the anxiety and panic-like symptoms. Dose history of being coming angry and irritable Lamictal does help there is no clear history of mania or hypomania.  She feels 60-70% improved on medication there is no reported side effects or rash  Aggravating factor: stress , works two jobs Modifying factor: medications. Support system Context; anger and stress Duration: 5 years Severity of depression: 7/10. 10 being no depression  No associated psychotic symptoms or mania.  Principal Problem: GAD and MDD Diagnosis:   Patient Active Problem List   Diagnosis Date Noted  . Gouty arthritis [M10.09] 11/04/2013  . ANA positive [R76.8] 09/22/2013  . Ache in joint [M25.50] 09/22/2013  . Muscle ache [M79.1] 09/22/2013  . Collagen disease ([place]) [M35.9] 09/22/2013  . Elevated erythrocyte sedimentation rate [R70.0] 09/22/2013  . IFG (impaired fasting glucose) [R73.01] 06/24/2012  . Asthmatic bronchitis [J45.909] 10/16/2011  . GAD (generalized anxiety disorder) [F41.1] 07/30/2011  . HYPERTENSION [I10] 04/07/2010  . UNSPECIFIED ANEMIA [D64.9] 03/29/2010  . Major depressive disorder, recurrent episode, severe, without mention of psychotic behavior [F33.2] 07/22/2009  . OTHER ABNORMAL GLUCOSE [R73.09] 05/25/2008  . HYPERTRIGLYCERIDEMIA [E78.1] 11/04/2007  . FATIGUE [R53.81, R53.83] 11/04/2007  . EXTERNAL HEMORRHOIDS [K64.4] 07/22/2007  . BACK PAIN [M54.9] 07/22/2007  . TOBACCO ABUSE [F17.200]  07/01/2007  . GERD [K21.9] 07/01/2007  . ABNORMAL THYROID FUNCTION TESTS [R94.6] 04/22/2007   Total Time spent with patient: 25 minutes   Past Medical History:  Past Medical History  Diagnosis Date  . Abnormal thyroid blood test   . Hypertension   . Anemia   . Depression   . Anxiety   . Tobacco abuse     Past Surgical History  Procedure Laterality Date  . 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 Cancer Mother 39    Breast  . Colon polyps Mother   . Other Mother     colon polyps  . Dementia Mother   . Anxiety disorder Mother   . Depression Mother   . Paranoid behavior Mother   . Depression Father   . Suicidality Father     commited suicide  . Alcohol abuse Father   . Colon polyps Maternal Aunt     cancerous colon polyps  . Cancer Maternal Uncle     cancerous colon polyps  . Heart disease Other   . Other Other     thyroid disorder  . Alcohol abuse Paternal Uncle   . Dementia Maternal Grandmother   . Alcohol abuse Paternal Grandmother   . Alcohol abuse Paternal Uncle    Social History:  History  Alcohol Use  . 0.0 oz/week  . 0.25 drink(s) per week     History  Drug Use No    Social History   Social History  . Marital Status: Single    Spouse Name: N/A  . Number  of Children: N/A  . Years of Education: N/A   Social History Main Topics  . Smoking status: Current Every Day Smoker -- 1.00 packs/day for 27 years    Types: Cigarettes  . Smokeless tobacco: Never Used  . Alcohol Use: 0.0 oz/week    0.25 drink(s) per week  . Drug Use: No  . Sexual Activity:    Partners: Female     Comment: prefers female sex partner   Other Topics Concern  . None   Social History Narrative   Additional History:    Sleep: Fair  Appetite:  Fair   Assessment: GAD stable, MDD stable   Musculoskeletal: Strength & Muscle Tone: within normal limits Gait & Station:  normal Patient leans: N/A   Psychiatric Specialty Exam: Physical Exam  ROS  Blood pressure 130/70, pulse 90, height 5' 8.5" (1.74 m), weight 244 lb (110.678 kg), SpO2 98 %.Body mass index is 36.56 kg/(m^2).  General Appearance: Neat and Well Groomed  Engineer, water::  Good  Speech:  Clear and Coherent  Volume:  Normal  Mood:  Euthymic  Affect:  Congruent  Thought Process:  Goal Directed, Intact, Linear and Logical  Orientation:  Full (Time, Place, and Person)  Thought Content:  WDL  Suicidal Thoughts:  No  Homicidal Thoughts:  No  Memory:  Immediate;   Good Recent;   Good Remote;   Good  Judgement:  Good  Insight:  Present  Psychomotor Activity:  Normal  Concentration:  Good  Recall:  Good  Fund of Knowledge:Good  Language: Good  Akathisia:  No  Handed:  Right  AIMS (if indicated):     Assets:  Communication Skills Desire for Improvement Housing Intimacy Leisure Time Physical Health Resilience Social Support Talents/Skills Transportation Vocational/Educational  ADL's:  Intact  Cognition: WNL  Sleep:  Fair  Notes she is sleeping during the day.     Current Medications: Current Outpatient Prescriptions  Medication Sig Dispense Refill  . lamoTRIgine (LAMICTAL) 100 MG tablet Take one tablet at day for mood stabilization. 30 tablet 1  . losartan-hydrochlorothiazide (HYZAAR) 100-25 MG per tablet Take 1 tablet by mouth daily. 30 tablet 5  . venlafaxine XR (EFFEXOR-XR) 75 MG 24 hr capsule Take 3 capsules (225 mg total) by mouth daily with breakfast. 90 capsule 0  . VENTOLIN HFA 108 (90 BASE) MCG/ACT inhaler INHALE 4 PUFFS INTO THE LUNGS 2 TIMES DAILY AS NEEDED. 18 Inhaler 3   No current facility-administered medications for this visit.    Lab Results: No results found for this or any previous visit (from the past 48 hour(s)).  Physical Findings: AIMS: CIWA:   COWS:    Treatment Plan Summary: Medication management  Medical Decision Making:  Established  Problem, Stable/Improving (1) MDD and anger 1. Will continue Lamictal as written x 3 months. 2. Gave patient information re: Needymeds.org to help with costs. 3. Cautioned the patient about skipping Lamictal.  GAD 1. Will continue Effexor XR at 225mg  po qd for generic. 2. Also information regarding side effects and concern 3. Will write for 3 months as noted. More than 50% of time spent in counseling and coordination of care including patient education Reviewed sleep hygiene Call 911 or report local emergency room for any urgent concerns or suicidal thoughts Follow up in 3 months    Pavle Wiler  11:27 AM 01/05/2015

## 2015-01-10 ENCOUNTER — Ambulatory Visit: Payer: Self-pay | Admitting: Family Medicine

## 2015-01-27 ENCOUNTER — Ambulatory Visit (HOSPITAL_COMMUNITY): Payer: Self-pay | Admitting: Medical

## 2015-02-04 ENCOUNTER — Ambulatory Visit (HOSPITAL_COMMUNITY): Payer: PRIVATE HEALTH INSURANCE | Admitting: Licensed Clinical Social Worker

## 2015-02-18 ENCOUNTER — Encounter: Payer: Self-pay | Admitting: Family Medicine

## 2015-02-18 ENCOUNTER — Ambulatory Visit (INDEPENDENT_AMBULATORY_CARE_PROVIDER_SITE_OTHER): Payer: BLUE CROSS/BLUE SHIELD | Admitting: Family Medicine

## 2015-02-18 ENCOUNTER — Ambulatory Visit (INDEPENDENT_AMBULATORY_CARE_PROVIDER_SITE_OTHER): Payer: BLUE CROSS/BLUE SHIELD | Admitting: Licensed Clinical Social Worker

## 2015-02-18 VITALS — BP 127/70 | HR 78 | Temp 98.0°F | Wt 240.0 lb

## 2015-02-18 DIAGNOSIS — S161XXA Strain of muscle, fascia and tendon at neck level, initial encounter: Secondary | ICD-10-CM

## 2015-02-18 DIAGNOSIS — R7301 Impaired fasting glucose: Secondary | ICD-10-CM

## 2015-02-18 DIAGNOSIS — I10 Essential (primary) hypertension: Secondary | ICD-10-CM

## 2015-02-18 DIAGNOSIS — L821 Other seborrheic keratosis: Secondary | ICD-10-CM | POA: Diagnosis not present

## 2015-02-18 DIAGNOSIS — G44209 Tension-type headache, unspecified, not intractable: Secondary | ICD-10-CM | POA: Diagnosis not present

## 2015-02-18 DIAGNOSIS — F330 Major depressive disorder, recurrent, mild: Secondary | ICD-10-CM | POA: Diagnosis not present

## 2015-02-18 DIAGNOSIS — F411 Generalized anxiety disorder: Secondary | ICD-10-CM | POA: Diagnosis not present

## 2015-02-18 DIAGNOSIS — R32 Unspecified urinary incontinence: Secondary | ICD-10-CM

## 2015-02-18 DIAGNOSIS — K219 Gastro-esophageal reflux disease without esophagitis: Secondary | ICD-10-CM

## 2015-02-18 LAB — POCT URINALYSIS DIPSTICK
BILIRUBIN UA: NEGATIVE
Blood, UA: NEGATIVE
GLUCOSE UA: NEGATIVE
KETONES UA: NEGATIVE
LEUKOCYTES UA: NEGATIVE
Nitrite, UA: NEGATIVE
Protein, UA: NEGATIVE
Spec Grav, UA: 1.015
Urobilinogen, UA: 0.2
pH, UA: 7

## 2015-02-18 LAB — POCT GLYCOSYLATED HEMOGLOBIN (HGB A1C): Hemoglobin A1C: 6.3

## 2015-02-18 MED ORDER — DEXLANSOPRAZOLE 30 MG PO CPDR: 30.0000 mg | DELAYED_RELEASE_CAPSULE | Freq: Every day | ORAL | Status: DC

## 2015-02-18 MED ORDER — BUTALBITAL-ASPIRIN-CAFFEINE 50-325-40 MG PO CAPS: 1.0000 | ORAL_CAPSULE | Freq: Two times a day (BID) | ORAL | Status: DC | PRN

## 2015-02-18 NOTE — Progress Notes (Signed)
Quick Note:  All labs are normal. ______ 

## 2015-02-18 NOTE — Progress Notes (Signed)
Subjective:    Patient ID: Brooke Kaiser, female    DOB: 01-17-1971, 45 y.o.   MRN: [id number]  HPI Here to discuss headaches.  She has had a lot nasal drainage. He thinks that has been triggering more the headaches seem to be on the top of her head. She's had headaches on and off but they just seem more problematic recently. No visual changes. No neurologic changes.  IFG - no inc thirst or urination. She has been drinking soda,.    Her upper neck has been bothering x 1 months. No injury or trauma.  Pain is bilateral and feels stiff.  Says sleeping on her partners pillow helps. Not taking med for it.   Hypertension- Pt denies chest pain, SOB, dizziness, or heart palpitations.  Taking meds as directed w/o problems.  Denies medication side effects.    Would like lesion on her chin frozen.   GERD - she says her reflux has been flaring more recently. She does drink soda and eats chocolate and has coffee in the morning. She's been taking over-the-counter ranitidine 3 times a day. She was previously on dexilant but when she lost her insurance she was unable to afford it. She now is on a new insurance plan and would like to get the prescription again.  She is also having some incontinence issues. With her current job she doesn't get regular bathroom breaks and has had several episodes where she is completely let her bladder go. She also feels though that she is using the bathroom too frequently. She says sometimes she can urinate and then 20 minutes later felt like she has to go again. She denies any dysuria or hematuria. No fevers chills or sweats. She has had this problem for years but it just seems to be worse since starting her new job.  Review of Systems     Objective:   Physical Exam  Constitutional: She is oriented to person, place, and time. She appears well-developed and well-nourished.  HENT:  Head: Normocephalic and atraumatic.  Right Ear: External ear normal.  Left Ear: External  ear normal.  Nose: Nose normal.  Mouth/Throat: Oropharynx is clear and moist.  TMs and canals are clear. Swelling of the nasal turbinates  Eyes: Conjunctivae and EOM are normal. Pupils are equal, round, and reactive to light.  Neck: Neck supple. No thyromegaly present.  Cardiovascular: Normal rate, regular rhythm and normal heart sounds.   No murmur heard. Pulmonary/Chest: Effort normal and breath sounds normal. She has no wheezes.  Musculoskeletal:  Normal cervical flexion, extension, rotation right and left, side bending. She is tender just lateral to the facet on the right and left side of the upper cervical spine. Nontender over the spine itself.  Lymphadenopathy:    She has no cervical adenopathy.  Neurological: She is alert and oriented to person, place, and time.  Skin: Skin is warm and dry. No rash noted.  Approximate 5 x 8 mm seborrheic keratosis on her chin.  Psychiatric: She has a normal mood and affect. Her behavior is normal. Judgment and thought content normal.          Assessment & Plan:  HA - recommend a trial of Fiornal.  Work on adequate sleep and stress reduction. Do not think she has a sinus infection but just some increased inflammation and postnasal drip.  IFG - up from last OV.  Recheck in 3 months. Work on diet and exercise.   GERD- recommend weight loss and quit  smoking.  We also discussed reflux measures to reduce her symptoms. We'll go ahead and send a new prescription for dexilant to the pharmacy. Given coupon card. Discussed long-term potential side effects of using PPIs.  HTN - uncontrolled. Continue current regimen.  Cervical strain - given H.O on stretches adn reveiwed 2 additional exercises. Work on stretches and heat. F/U in 3 months. Can refer to PT if not improving.    Seborrheic keratosis-cryotherapy performed as below.  Urinary frequency-discussed options. He could certainly be overactive bladder. It could be that she is also not completely  emptying her bladder, or it may be that she just has an anatomically small bladder. UA was negative for any side effects infection or glucose. She would like to try an overactive bladder medication first and if not effective then consider referral to urology for further workup.  Cryotherapy Procedure Note  Pre-operative Diagnosis: Seborrheic keratosis  Post-operative Diagnosis: same  Locations: chin    Indications: irritation   Anesthesia: none  Procedure Details  Patient informed of risks (permanent scarring, infection, light or dark discoloration, bleeding, infection, weakness, numbness and recurrence of the lesion) and benefits of the procedure and verbal informed consent obtained.  The areas are treated with liquid nitrogen therapy, frozen until ice ball extended 2 mm beyond lesion, allowed to thaw, and treated again. The patient tolerated procedure well.  The patient was instructed on post-op care, warned that there may be blister formation, redness and pain. Recommend OTC analgesia as needed for pain.  Condition: Stable  Complications: none.  Plan: 1. Instructed to keep the area dry and covered for 24-48h and clean thereafter. 2. Warning signs of infection were reviewed.   3. Recommended that the patient use OTC acetaminophen as needed for pain.  4. Return PRN.
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
## 2015-02-23 ENCOUNTER — Telehealth: Payer: Self-pay | Admitting: Family Medicine

## 2015-02-23 MED ORDER — SOLIFENACIN SUCCINATE 5 MG PO TABS: 5.0000 mg | ORAL_TABLET | Freq: Every day | ORAL | Status: DC

## 2015-02-23 NOTE — Telephone Encounter (Signed)
Left message to call for recommendations

## 2015-02-23 NOTE — Telephone Encounter (Signed)
Pt states at her last visit it was discussed she would get an Rx for her incontinence issues? There was nothing sent to the pharmacy. Will route to PCP for review.

## 2015-02-23 NOTE — Telephone Encounter (Signed)
I did forget to send the medication. Please tell her I apologize. New prescription sent for Vesicare. She may also want to take online to see if there are any coupon cards available area

## 2015-02-24 ENCOUNTER — Telehealth: Payer: Self-pay | Admitting: Family Medicine

## 2015-02-24 NOTE — Telephone Encounter (Signed)
Pt advised, verbalized understanding. No further questions.  

## 2015-02-24 NOTE — Telephone Encounter (Signed)
Received fax from [facility] and they denied coverage on Dexilant due to patient has to try and fail two alternatives off the members formulary. Reference # [id number]. - CF

## 2015-02-25 ENCOUNTER — Telehealth: Payer: Self-pay | Admitting: Family Medicine

## 2015-02-25 NOTE — Telephone Encounter (Signed)
Left VM for Pt to return clinic call regarding failed Rx's tried.

## 2015-02-25 NOTE — Telephone Encounter (Signed)
Call pt:  Dexilant is not covered.  They are requiring she try more than one a PPI besides the pantoprozole.  Would she like to try lansoprazole?

## 2015-03-04 ENCOUNTER — Telehealth: Payer: Self-pay | Admitting: Family Medicine

## 2015-03-04 NOTE — Telephone Encounter (Signed)
PA request for Vesicare was submitted. Received information back from insurance company, Rx was denied until Pt tries and fails their preferred formulary. Will put information in ordering providers box for review.

## 2015-03-07 MED ORDER — DARIFENACIN HYDROBROMIDE ER 7.5 MG PO TB24: 7.5000 mg | ORAL_TABLET | Freq: Every day | ORAL | Status: DC

## 2015-03-07 NOTE — Telephone Encounter (Signed)
Please see note below. In addition [employer] won't cover her vesicare for her bladder so I am going to send over a new Rx for the vesicare.

## 2015-03-11 ENCOUNTER — Ambulatory Visit (HOSPITAL_COMMUNITY): Payer: BLUE CROSS/BLUE SHIELD | Admitting: Licensed Clinical Social Worker

## 2015-03-12 ENCOUNTER — Other Ambulatory Visit: Payer: Self-pay | Admitting: Family Medicine

## 2015-04-04 ENCOUNTER — Ambulatory Visit (HOSPITAL_COMMUNITY): Payer: Self-pay | Admitting: Psychiatry

## 2015-04-05 ENCOUNTER — Ambulatory Visit (HOSPITAL_COMMUNITY): Payer: Self-pay | Admitting: Psychiatry

## 2015-04-06 ENCOUNTER — Telehealth: Payer: Self-pay | Admitting: *Deleted

## 2015-04-06 NOTE — Telephone Encounter (Signed)
[place] noted. Will check for PA

## 2015-04-06 NOTE — Telephone Encounter (Signed)
I already sent a new prescription for Enablex. Not sure if that one requires a PA or not?

## 2015-04-06 NOTE — Telephone Encounter (Signed)
I see that a PA was submitted and denied for this patient for Vesicare. I received a duplicate request for PA for this. Is there another medication that is comparable?

## 2015-04-08 ENCOUNTER — Other Ambulatory Visit (HOSPITAL_COMMUNITY): Payer: Self-pay | Admitting: Psychiatry

## 2015-04-11 NOTE — Telephone Encounter (Signed)
Received medication request from [facility] for Lamictal 100mg  and Effexor 75mg . Per Dr. De Nurse, pt is authorized for a refill for Lamictal 100mg , # 30 and Effexor 75mg , #90.  Prescriptions were sent to pharmacy. Pt is schedule for a f/u appt on 04/18/15. Called and informed pt of prescriptions status. Pt verbalizes understanding.

## 2015-04-11 NOTE — Telephone Encounter (Signed)
A PA was initiated

## 2015-04-14 NOTE — Telephone Encounter (Signed)
enablex was denied by insurance co. Denial letter placed in [place]'s box

## 2015-04-15 ENCOUNTER — Telehealth: Payer: Self-pay | Admitting: Family Medicine

## 2015-04-15 MED ORDER — TROSPIUM CHLORIDE ER 60 MG PO CP24: 1.0000 | ORAL_CAPSULE | ORAL | Status: DC

## 2015-04-15 NOTE — Telephone Encounter (Signed)
Call patient: Her Enablex was denied as well. They will pay for [place] XR. I will try to send this prescription over to her pharmacy.

## 2015-04-15 NOTE — Telephone Encounter (Signed)
Left message for patient

## 2015-04-18 ENCOUNTER — Ambulatory Visit (HOSPITAL_COMMUNITY): Payer: BLUE CROSS/BLUE SHIELD | Admitting: Licensed Clinical Social Worker

## 2015-05-19 ENCOUNTER — Ambulatory Visit: Payer: Self-pay | Admitting: Family Medicine

## 2015-06-19 ENCOUNTER — Other Ambulatory Visit (HOSPITAL_COMMUNITY): Payer: Self-pay | Admitting: Psychiatry

## 2015-06-21 ENCOUNTER — Ambulatory Visit: Payer: Self-pay | Admitting: Family Medicine

## 2015-06-22 ENCOUNTER — Telehealth (HOSPITAL_COMMUNITY): Payer: Self-pay | Admitting: Psychiatry

## 2015-06-23 NOTE — Telephone Encounter (Signed)
Pt called for a refill for Lamictal and Effexor. Per Dr. De Nurse, pt is authorized for a 10 day refill for Lamictal 100mg , and Effexor 75mg . Prescriptions were sent to pharmacy. Pt is schedule for a f/u appt on 06/30/15. Called and informed pt of rx status. Informed pt if next appt is missed, she could be discharged from clinic.  Pt verbalizes understanding.

## 2015-06-24 NOTE — Telephone Encounter (Signed)
  Pt called for a refill for Lamictal and Effexor. Per Dr. De Nurse, pt is authorized for a 10 day refill for Lamictal 100mg , and Effexor 75mg . Prescriptions were sent to pharmacy. Pt is schedule for a f/u appt on 06/30/15. Called and informed pt of rx status. Informed pt if next appt is missed, she could be discharged from clinic. Pt verbalizes understanding.

## 2015-06-29 ENCOUNTER — Ambulatory Visit (INDEPENDENT_AMBULATORY_CARE_PROVIDER_SITE_OTHER): Payer: BLUE CROSS/BLUE SHIELD | Admitting: Family Medicine

## 2015-06-29 ENCOUNTER — Encounter: Payer: Self-pay | Admitting: Family Medicine

## 2015-06-29 VITALS — BP 125/75 | HR 75 | Wt 235.0 lb

## 2015-06-29 DIAGNOSIS — R05 Cough: Secondary | ICD-10-CM

## 2015-06-29 DIAGNOSIS — R946 Abnormal results of thyroid function studies: Secondary | ICD-10-CM | POA: Diagnosis not present

## 2015-06-29 DIAGNOSIS — R5383 Other fatigue: Secondary | ICD-10-CM | POA: Diagnosis not present

## 2015-06-29 DIAGNOSIS — F17200 Nicotine dependence, unspecified, uncomplicated: Secondary | ICD-10-CM

## 2015-06-29 DIAGNOSIS — R7301 Impaired fasting glucose: Secondary | ICD-10-CM | POA: Diagnosis not present

## 2015-06-29 DIAGNOSIS — N926 Irregular menstruation, unspecified: Secondary | ICD-10-CM | POA: Diagnosis not present

## 2015-06-29 DIAGNOSIS — R053 Chronic cough: Secondary | ICD-10-CM

## 2015-06-29 LAB — COMPLETE METABOLIC PANEL WITH GFR
ALBUMIN: 4 g/dL (ref 3.6–5.1)
ALK PHOS: 59 U/L (ref 33–115)
ALT: 15 U/L (ref 6–29)
AST: 12 U/L (ref 10–35)
BILIRUBIN TOTAL: 0.4 mg/dL (ref 0.2–1.2)
BUN: 12 mg/dL (ref 7–25)
CALCIUM: 9.3 mg/dL (ref 8.6–10.2)
CO2: 25 mmol/L (ref 20–31)
CREATININE: 0.79 mg/dL (ref 0.50–1.10)
Chloride: 105 mmol/L (ref 98–110)
Glucose, Bld: 103 mg/dL — ABNORMAL HIGH (ref 65–99)
Potassium: 3.9 mmol/L (ref 3.5–5.3)
Sodium: 138 mmol/L (ref 135–146)
TOTAL PROTEIN: 6.5 g/dL (ref 6.1–8.1)

## 2015-06-29 LAB — CBC WITH DIFFERENTIAL/PLATELET
BASOS ABS: 0 {cells}/uL (ref 0–200)
Basophils Relative: 0 %
EOS ABS: 210 {cells}/uL (ref 15–500)
EOS PCT: 3 %
HEMATOCRIT: 40 % (ref 35.0–45.0)
HEMOGLOBIN: 13.9 g/dL (ref 11.7–15.5)
LYMPHS ABS: 2170 {cells}/uL (ref 850–3900)
Lymphocytes Relative: 31 %
MCH: 30.8 pg (ref 27.0–33.0)
MCHC: 34.8 g/dL (ref 32.0–36.0)
MCV: 88.7 fL (ref 80.0–100.0)
MONO ABS: 560 {cells}/uL (ref 200–950)
MPV: 9.1 fL (ref 7.5–12.5)
Monocytes Relative: 8 %
NEUTROS ABS: 4060 {cells}/uL (ref 1500–7800)
NEUTROS PCT: 58 %
Platelets: 263 10*3/uL (ref 140–400)
RBC: 4.51 MIL/uL (ref 3.80–5.10)
RDW: 14.2 % (ref 11.0–15.0)
WBC: 7 10*3/uL (ref 3.8–10.8)

## 2015-06-29 LAB — VITAMIN B12: Vitamin B-12: 414 pg/mL (ref 200–1100)

## 2015-06-29 LAB — LIPID PANEL
Cholesterol: 180 mg/dL (ref 125–200)
HDL: 27 mg/dL — ABNORMAL LOW (ref >=46)
TRIGLYCERIDES: 440 mg/dL — AB (ref <150)
Total CHOL/HDL Ratio: 6.7 Ratio — ABNORMAL HIGH (ref <=5.0)

## 2015-06-29 LAB — TSH: TSH: 1.09 mIU/L

## 2015-06-29 LAB — FERRITIN: FERRITIN: 50 ng/mL (ref 10–232)

## 2015-06-29 LAB — POCT GLYCOSYLATED HEMOGLOBIN (HGB A1C): HEMOGLOBIN A1C: 5.9

## 2015-06-29 NOTE — Progress Notes (Signed)
Subjective:    CC: Fatigue  HPI:  She is feeling very fatigued and drained x 1-2 months. C/O of irregular periods with abnormal cycles.  Gets some bleeding after "intimacy" and will last for 1.5 days.  Also having some intermittantly heavy bleeding for up to 10 days.  Prior to her current cycle her last real period was about 6 months ago. She does complain of snoring. She says it's not severe or especially loud. She occasionally will wake up with a morning headache but says it's pretty rare. She does have excessive daytime sleepiness though. As far as her mood is concerned she feels like overall it's in a good place. She says she will get down about twice a month for about a day. It usually tends to coincide when she forgets to take her medication. She feels like she sleeping well at least 8-9 hours per night.  Follow up urinary frequency-she did start the [place] but unfortunately it made her dizzy so she just stopped it. She's just been trying to find places go to the bathroom on her Route so that she can stop and go.  She also complains of a chronic cough that has been going on for several months and wonders if she could get a chest x-ray. She does continue to smoke. No sputum.  No fever, chills, etc.  No sinus sxs.    IFG - no inc thrist.  She does have urinary frequency.    Past medical history, Surgical history, Family history not pertinant except as noted below, Social history, Allergies, and medications have been entered into the medical record, reviewed, and corrections made.   Review of Systems: No fevers, chills, night sweats, weight loss, chest pain, or shortness of breath.   Objective:    General: Well Developed, well nourished, and in no acute distress.  Neuro: Alert and oriented x3, extra-ocular muscles intact, sensation grossly intact.  HEENT: Normocephalic, atraumatic  Skin: Warm and dry, no rashes. Cardiac: Regular rate and rhythm, no murmurs rubs or gallops, no lower  extremity edema.  Respiratory: Clear to auscultation bilaterally. Not using accessory muscles, speaking in full sentences.   Impression and Recommendations:   Fatigue-unclear etiology. No signs or symptoms of a specific viral or infectious cause. We will check a CBC. We will also evaluate for anemia and thyroid dysfunction.  Irregular periods-we'll do some additional blood work to look at hormones and set her up for pelvic ultrasound. Will need to schedule her Pap smear this summer.  Chronic cough-we'll get chest x-ray today for further evaluation. Encourage smoking cessation.  IFG - hemoglobin A1c down to 5.9 from previous of 6.3. This is great improvement.  Urinary frequency - offered to try a different medication since the [place] made her feel dizzy. She declined at this point in time.

## 2015-06-30 ENCOUNTER — Encounter (HOSPITAL_COMMUNITY): Payer: Self-pay | Admitting: Psychiatry

## 2015-06-30 ENCOUNTER — Ambulatory Visit (INDEPENDENT_AMBULATORY_CARE_PROVIDER_SITE_OTHER): Payer: BLUE CROSS/BLUE SHIELD | Admitting: Psychiatry

## 2015-06-30 VITALS — BP 124/76 | HR 90 | Ht 68.5 in | Wt 234.0 lb

## 2015-06-30 DIAGNOSIS — F331 Major depressive disorder, recurrent, moderate: Secondary | ICD-10-CM

## 2015-06-30 DIAGNOSIS — F411 Generalized anxiety disorder: Secondary | ICD-10-CM | POA: Diagnosis not present

## 2015-06-30 DIAGNOSIS — R454 Irritability and anger: Secondary | ICD-10-CM | POA: Diagnosis not present

## 2015-06-30 LAB — FOLLICLE STIMULATING HORMONE: FSH: 13.3 m[IU]/mL

## 2015-06-30 LAB — PROGESTERONE

## 2015-06-30 LAB — ESTRADIOL: ESTRADIOL: 118 pg/mL

## 2015-06-30 LAB — LUTEINIZING HORMONE: LH: 8.4 m[IU]/mL

## 2015-06-30 LAB — VITAMIN D 25 HYDROXY (VIT D DEFICIENCY, FRACTURES): Vit D, 25-Hydroxy: 23 ng/mL — ABNORMAL LOW (ref 30–100)

## 2015-06-30 MED ORDER — VENLAFAXINE HCL ER 75 MG PO CP24: ORAL_CAPSULE | ORAL | Status: DC

## 2015-06-30 MED ORDER — LAMOTRIGINE 100 MG PO TABS: ORAL_TABLET | ORAL | Status: DC

## 2015-06-30 NOTE — Progress Notes (Signed)
Patient ID: Brooke Kaiser, female   DOB: April 13, 1970, 45 y.o.   MRN: [id number] [facility] MD Progress Note  06/30/2015 11:32 AM MILLEY VINING  MRN:  [id number] Subjective:  Brooke Kaiser is in to follow up on her GAD and MDD. She has been compliant with her medication and has seen Lovenia Shuck before.   Patient lives with her girlfriend. Initially started treatment 2011 when she was stressed out extreme anxiety. She was self cutting. No hospitalization was done but she started on medication. Effexor has been helpful for the anxiety and panic-like symptoms. Dose history of being coming angry and irritable Lamictal does help there is no clear history of mania or hypomania.  Last visit secondary to her medication she continues to do well but sometimes when she skips the dose of Lamictal she does feel her anger comes back. She works full-time and keeps herself busy and distracted She recently had visited her primary care and labs have been done. Briefly reviewed the labs with her but she will make an appointment with the primary care or give them a call for review She feels 60-70% improved on medication there is no reported side effects or rash  Aggravating factor: stress , works two jobs Modifying factor: medications. Support system Context; anger and stress Duration: 5 years Severity of depression: 7/10. 10 being no depression  No associated psychotic symptoms or mania.  Principal Problem: GAD and MDD Diagnosis:   Patient Active Problem List   Diagnosis Date Noted  . Gouty arthritis [M10.09] 11/04/2013  . ANA positive [R76.8] 09/22/2013  . Muscle ache [M79.1] 09/22/2013  . Collagen disease ([place]) [M35.9] 09/22/2013  . Elevated erythrocyte sedimentation rate [R70.0] 09/22/2013  . IFG (impaired fasting glucose) [R73.01] 06/24/2012  . Asthmatic bronchitis [J45.909] 10/16/2011  . GAD (generalized anxiety disorder) [F41.1] 07/30/2011  . Essential hypertension [I10] 04/07/2010  . UNSPECIFIED ANEMIA [D64.9]  03/29/2010  . Major depressive disorder, recurrent episode, severe, without mention of psychotic behavior [F33.2] 07/22/2009  . OTHER ABNORMAL GLUCOSE [R73.09] 05/25/2008  . HYPERTRIGLYCERIDEMIA [E78.1] 11/04/2007  . FATIGUE [R53.81, R53.83] 11/04/2007  . EXTERNAL HEMORRHOIDS [K64.4] 07/22/2007  . BACK PAIN [M54.9] 07/22/2007  . TOBACCO ABUSE [F17.200] 07/01/2007  . GERD [K21.9] 07/01/2007  . ABNORMAL THYROID FUNCTION TESTS [R94.6] 04/22/2007   Total Time spent with patient: 25 minutes   Past Medical History:  Past Medical History  Diagnosis Date  . Abnormal thyroid blood test   . Hypertension   . Anemia   . Depression   . Anxiety   . Tobacco abuse     Past Surgical History  Procedure Laterality Date  . 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 Cancer Mother 48    Breast  . Colon polyps Mother   . Other Mother     colon polyps  . Dementia Mother   . Anxiety disorder Mother   . Depression Mother   . Paranoid behavior Mother   . Depression Father   . Suicidality Father     commited suicide  . Alcohol abuse Father   . Colon polyps Maternal Aunt     cancerous colon polyps  . Cancer Maternal Uncle     cancerous colon polyps  . Heart disease Other   . Other Other     thyroid disorder  . Alcohol abuse Paternal Uncle   . Dementia Maternal Grandmother   . Alcohol  abuse Paternal Grandmother   . Alcohol abuse Paternal Uncle    Social History:  History  Alcohol Use  . 0.0 oz/week  . 0.25 drink(s) per week     History  Drug Use No    Social History   Social History  . Marital Status: Single    Spouse Name: N/A  . Number of Children: N/A  . Years of Education: N/A   Social History Main Topics  . Smoking status: Current Every Day Smoker -- 1.00 packs/day for 27 years    Types: Cigarettes  . Smokeless tobacco: Never Used  . Alcohol Use: 0.0 oz/week    0.25 drink(s) per  week  . Drug Use: No  . Sexual Activity:    Partners: Female     Comment: prefers female sex partner   Other Topics Concern  . None   Social History Narrative   Additional History:    Sleep: Fair  Appetite:  Fair   Assessment: GAD stable, MDD stable   Musculoskeletal: Strength & Muscle Tone: within normal limits Gait & Station: normal Patient leans: N/A   Psychiatric Specialty Exam: Physical Exam  Review of Systems  Cardiovascular: Negative for chest pain.  Neurological: Negative for tremors.  Psychiatric/Behavioral: Negative for depression and hallucinations. The patient is not nervous/anxious.     Blood pressure 124/76, pulse 90, height 5' 8.5" (1.74 m), weight 234 lb (106.142 kg), SpO2 96 %.Body mass index is 35.06 kg/(m^2).  General Appearance: Neat and Well Groomed  Engineer, water::  Good  Speech:  Clear and Coherent  Volume:  Normal  Mood:  Euthymic  Affect:  Congruent  Thought Process:  Goal Directed, Intact, Linear and Logical  Orientation:  Full (Time, Place, and Person)  Thought Content:  WDL  Suicidal Thoughts:  No  Homicidal Thoughts:  No  Memory:  Immediate;   Good Recent;   Good Remote;   Good  Judgement:  Good  Insight:  Present  Psychomotor Activity:  Normal  Concentration:  Good  Recall:  Good  Fund of Knowledge:Good  Language: Good  Akathisia:  No  Handed:  Right  AIMS (if indicated):     Assets:  Communication Skills Desire for Improvement Housing Intimacy Leisure Time Physical Health Resilience Social Support Talents/Skills Transportation Vocational/Educational  ADL's:  Intact  Cognition: WNL  Sleep:  Fair  Notes she is sleeping during the day.     Current Medications: Current Outpatient Prescriptions  Medication Sig Dispense Refill  . lamoTRIgine (LAMICTAL) 100 MG tablet Take one tablet at day for mood stabilization. 30 tablet 1  . losartan-hydrochlorothiazide (HYZAAR) 100-25 MG per tablet Take 1 tablet by mouth daily.  30 tablet 5  . venlafaxine XR (EFFEXOR-XR) 75 MG 24 hr capsule Take 3 capsules (225 mg total) by mouth daily with breakfast. 90 capsule 0  . VENTOLIN HFA 108 (90 BASE) MCG/ACT inhaler INHALE 4 PUFFS INTO THE LUNGS 2 TIMES DAILY AS NEEDED. 18 Inhaler 3   No current facility-administered medications for this visit.    Lab Results:  Results for orders placed or performed in visit on 06/29/15 (from the past 48 hour(s))  POCT glycosylated hemoglobin (Hb A1C)     Status: None   Collection Time: 06/29/15  9:36 AM  Result Value Ref Range   Hemoglobin A1C 5.9   CBC with Differential     Status: None   Collection Time: 06/29/15 10:08 AM  Result Value Ref Range   WBC 7.0 3.8 - 10.8 K/uL  RBC 4.51 3.80 - 5.10 MIL/uL   Hemoglobin 13.9 11.7 - 15.5 g/dL   HCT 40.0 35.0 - 45.0 %   MCV 88.7 80.0 - 100.0 fL   MCH 30.8 27.0 - 33.0 pg   MCHC 34.8 32.0 - 36.0 g/dL   RDW 14.2 11.0 - 15.0 %   Platelets 263 140 - 400 K/uL   MPV 9.1 7.5 - 12.5 fL   Neutro Abs 4060 1500 - 7800 cells/uL   Lymphs Abs 2170 850 - 3900 cells/uL   Monocytes Absolute 560 200 - 950 cells/uL   Eosinophils Absolute 210 15 - 500 cells/uL   Basophils Absolute 0 0 - 200 cells/uL   Neutrophils Relative % 58 %   Lymphocytes Relative 31 %   Monocytes Relative 8 %   Eosinophils Relative 3 %   Basophils Relative 0 %   Smear Review Criteria for review not met     Comment: ** Please note change in unit of measure and reference range(s). **  Ferritin     Status: None   Collection Time: 06/29/15 10:08 AM  Result Value Ref Range   Ferritin 50 10 - 232 ng/mL  B12     Status: None   Collection Time: 06/29/15 10:08 AM  Result Value Ref Range   Vitamin B-12 414 200 - 1100 pg/mL  Vitamin D (25 hydroxy)     Status: Abnormal   Collection Time: 06/29/15 10:08 AM  Result Value Ref Range   Vit D, 25-Hydroxy 23 (L) 30 - 100 ng/mL    Comment: Vitamin D Status           25-OH Vitamin D        Deficiency                <20 ng/mL         Insufficiency         20 - 29 ng/mL        Optimal             > or = 30 ng/mL   For 25-OH Vitamin D testing on patients on D2-supplementation and patients for whom quantitation of D2 and D3 fractions is required, the QuestAssureD 25-OH VIT D, (D2,D3), LC/MS/MS is recommended: order code [id number] (patients > 2 yrs).   COMPLETE METABOLIC PANEL WITH GFR     Status: Abnormal   Collection Time: 06/29/15 10:08 AM  Result Value Ref Range   Sodium 138 135 - 146 mmol/L   Potassium 3.9 3.5 - 5.3 mmol/L   Chloride 105 98 - 110 mmol/L   CO2 25 20 - 31 mmol/L   Glucose, Bld 103 (H) 65 - 99 mg/dL   BUN 12 7 - 25 mg/dL   Creat 0.79 0.50 - 1.10 mg/dL   Total Bilirubin 0.4 0.2 - 1.2 mg/dL   Alkaline Phosphatase 59 33 - 115 U/L   AST 12 10 - 35 U/L   ALT 15 6 - 29 U/L   Total Protein 6.5 6.1 - 8.1 g/dL   Albumin 4.0 3.6 - 5.1 g/dL   Calcium 9.3 8.6 - 10.2 mg/dL   GFR, Est African American >89 >=60 mL/min   GFR, Est Non African American >89 >=60 mL/min    Comment:   The estimated GFR is a calculation valid for adults (>=43 years old) that uses the CKD-EPI algorithm to adjust for age and sex. It is   not to be used for children, pregnant women, hospitalized patients,  patients on dialysis, or with rapidly changing kidney function. According to the NKDEP, eGFR >89 is normal, 60-89 shows mild impairment, 30-59 shows moderate impairment, 15-29 shows severe impairment and <15 is ESRD.     Lipid panel     Status: Abnormal   Collection Time: 06/29/15 10:08 AM  Result Value Ref Range   Cholesterol 180 125 - 200 mg/dL   Triglycerides 440 (H) <150 mg/dL   HDL 27 (L) >=46 mg/dL   Total CHOL/HDL Ratio 6.7 (H) <=5.0 Ratio   VLDL NOT CALC <30 mg/dL    Comment:   Not calculated due to Triglyceride >400. Suggest ordering Direct LDL (Unit Code: [phone]).    LDL Cholesterol NOT CALC <130 mg/dL    Comment:   Not calculated due to Triglyceride >400. Suggest ordering Direct LDL (Unit Code: [phone]).    Total Cholesterol/HDL Ratio:CHD Risk                        Coronary Heart Disease Risk Table                                        Men       Women          1/2 Average Risk              3.4        3.3              Average Risk              5.0        4.4           2X Average Risk              9.6        7.1           3X Average Risk             23.4       11.0 Use the calculated Patient Ratio above and the CHD Risk table  to determine the patient's CHD Risk.   TSH     Status: None   Collection Time: 06/29/15 10:08 AM  Result Value Ref Range   TSH 1.09 mIU/L    Comment:   Reference Range   > or = 20 Years  0.40-4.50   Pregnancy Range First trimester  0.26-2.66 Second trimester 0.55-2.73 Third trimester  0.43-2.91     FSH     Status: None   Collection Time: 06/29/15 10:08 AM  Result Value Ref Range   FSH 13.3 mIU/mL    Comment:   Reference Range Female >=5 years of age: Follicular Phase 0.8-14.4 Mid-Cycle Peak   3.1-17.7 Luteal Phase     1.5-9.1 Postmenopausal   23.0-116.3     LH     Status: None   Collection Time: 06/29/15 10:08 AM  Result Value Ref Range   LH 8.4 mIU/mL    Comment: Reference Range Female  Follicular Phase 8.1-85.6 mIU/mL  Mid-Cycle Peak 8.7-76.3 mIU/mL  Luteal Phase 0.5-16.9 mIU/mL  Postmenopausal 10.0-54.7 mIU/mL     Estradiol     Status: None   Collection Time: 06/29/15 10:08 AM  Result Value Ref Range   Estradiol 118 pg/mL    Comment: Reference Range (pg/mL) Female Follicular Phase 31-497 Mid-Cycle 64-357 Luteal Phase 56-214 Postmenopausal <=31  Reference range established on post-pubertal patient population. No pre-pubertal reference range established using this assay. For any patients for whom low estradiol levels are anticipated (e.g. males, pre-pubertal children, and hypogonadal/post-menopausal females), the [employer] Estradiol, Ultrasensitive, LCMSMS assay is recommended. (Order code [id number]).      Progesterone     Status: None   Collection Time: 06/29/15 10:08 AM  Result Value Ref Range   Progesterone <0.5 ng/mL    Comment: Reference Range Female  Follicular Phase <7.0 ng/mL  Luteal Phase 2.6-21.5 ng/mL  Postmenopausal  <0.5 ng/mL Pregnancy  First Trimester 4.1-34.0 ng/mL  Second Trimester 24.0-76.0 ng/mL  Third Trimester 52.0-302.0 ng/mL       Physical Findings: AIMS: CIWA:   COWS:    Treatment Plan Summary: Medication management  Medical Decision Making:  Established Problem, Stable/Improving (1) MDD and anger 1. Will continue Lamictal as written x 3 months. 2. Reviewed sleep hygiene 3. Cautioned the patient about skipping Lamictal. No change in dose needed  anger : lamictal helps.  GAD 1. Will continue Effexor XR at '225mg'$  po qd for generic. 2. Also information regarding side effects and concern 3. Will write for 3 months as noted. More than 50% of time spent in counseling and coordination of care including patient education Reviewed sleep hygiene Call 911 or report local emergency room for any urgent concerns or suicidal thoughts Follow up in 3 months    Jailah Willis  11:32 AM 06/30/2015

## 2015-08-10 ENCOUNTER — Ambulatory Visit (INDEPENDENT_AMBULATORY_CARE_PROVIDER_SITE_OTHER): Payer: BLUE CROSS/BLUE SHIELD | Admitting: Family Medicine

## 2015-08-10 NOTE — Progress Notes (Signed)
Patient was a no-show for today's physical.  [name], MD

## 2015-09-23 ENCOUNTER — Other Ambulatory Visit: Payer: Self-pay | Admitting: Family Medicine

## 2015-10-12 ENCOUNTER — Ambulatory Visit (INDEPENDENT_AMBULATORY_CARE_PROVIDER_SITE_OTHER): Payer: BLUE CROSS/BLUE SHIELD | Admitting: Psychiatry

## 2015-10-12 ENCOUNTER — Encounter (HOSPITAL_COMMUNITY): Payer: Self-pay | Admitting: Psychiatry

## 2015-10-12 VITALS — BP 128/76 | HR 84 | Resp 18 | Ht 68.5 in | Wt 236.0 lb

## 2015-10-12 DIAGNOSIS — R454 Irritability and anger: Secondary | ICD-10-CM | POA: Diagnosis not present

## 2015-10-12 DIAGNOSIS — F411 Generalized anxiety disorder: Secondary | ICD-10-CM | POA: Diagnosis not present

## 2015-10-12 DIAGNOSIS — F331 Major depressive disorder, recurrent, moderate: Secondary | ICD-10-CM | POA: Diagnosis not present

## 2015-10-12 MED ORDER — VENLAFAXINE HCL ER 75 MG PO CP24: ORAL_CAPSULE | ORAL | 3 refills | Status: DC

## 2015-10-12 MED ORDER — LAMOTRIGINE 100 MG PO TABS: ORAL_TABLET | ORAL | 3 refills | Status: DC

## 2015-10-12 NOTE — Progress Notes (Signed)
Patient ID: Brooke Kaiser, female   DOB: [birth year], 45 y.o.   MRN: [id number] [facility] MD Progress Note  10/12/2015 9:05 AM ARMANIE SOLECKI  MRN:  [id number] Subjective:  Brooke Kaiser is in to follow up on her GAD and MDD. She has been compliant with her medication and has seen Lovenia Shuck before.   Patient lives with her girlfriend. Initially started treatment 2011 when she was stressed out extreme anxiety. She was self cutting. No hospitalization was done but she started on medication. Effexor has been helpful for the anxiety and panic-like symptoms.  Anger and mood symptoms are reasonably controlled on Lamictal there is no rash. She feels 60-70% improved on medication from her baseline,recently injured her hand so on wrist cast.  Aggravating factor: stress , works two jobs Modifying factor: medications. Support system Context; anger and stress Duration: 5 years Severity of depression: 7/10. 10 being no depression  No associated psychotic symptoms or mania.  Principal Problem: GAD and MDD Diagnosis:   Patient Active Problem List   Diagnosis Date Noted  . Gouty arthritis [M10.09] 11/04/2013  . ANA positive [R76.8] 09/22/2013  . Muscle ache [M79.1] 09/22/2013  . Collagen disease ([place]) [M35.9] 09/22/2013  . Elevated erythrocyte sedimentation rate [R70.0] 09/22/2013  . IFG (impaired fasting glucose) [R73.01] 06/24/2012  . Asthmatic bronchitis [J45.909] 10/16/2011  . GAD (generalized anxiety disorder) [F41.1] 07/30/2011  . Essential hypertension [I10] 04/07/2010  . UNSPECIFIED ANEMIA [D64.9] 03/29/2010  . Major depressive disorder, recurrent episode, severe, without mention of psychotic behavior [F33.2] 07/22/2009  . OTHER ABNORMAL GLUCOSE [R73.09] 05/25/2008  . HYPERTRIGLYCERIDEMIA [E78.1] 11/04/2007  . FATIGUE [R53.81, R53.83] 11/04/2007  . EXTERNAL HEMORRHOIDS [K64.4] 07/22/2007  . BACK PAIN [M54.9] 07/22/2007  . TOBACCO ABUSE [F17.200] 07/01/2007  . GERD [K21.9] 07/01/2007  . ABNORMAL  THYROID FUNCTION TESTS [R94.6] 04/22/2007   Total Time spent with patient: 25 minutes   Past Medical History:  Past Medical History:  Diagnosis Date  . Abnormal thyroid blood test   . Anemia   . Anxiety   . Depression   . Hypertension   . Tobacco abuse     Past Surgical History:  Procedure Laterality Date  . 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 Cancer Mother 21    Breast  . Colon polyps Mother   . Other Mother     colon polyps  . Dementia Mother   . Anxiety disorder Mother   . Depression Mother   . Paranoid behavior Mother   . Depression Father   . Suicidality Father     commited suicide  . Alcohol abuse Father   . Colon polyps Maternal Aunt     cancerous colon polyps  . Cancer Maternal Uncle     cancerous colon polyps  . Heart disease Other   . Other Other     thyroid disorder  . Alcohol abuse Paternal Uncle   . Dementia Maternal Grandmother   . Alcohol abuse Paternal Grandmother   . Alcohol abuse Paternal Uncle    Social History:  History  Alcohol Use No     History  Drug Use No    Social History   Social History  . Marital status: Single    Spouse name: N/A  . Number of children: N/A  . Years of education: N/A   Social History Main Topics  . Smoking status: Current Every Day Smoker  Packs/day: 0.50    Years: 27.00    Types: Cigarettes  . Smokeless tobacco: Never Used     Comment: pt is reducing # of cigarettes per day  . Alcohol use No  . Drug use: No  . Sexual activity: Yes    Partners: Female     Comment: prefers female sex partner   Other Topics Concern  . None   Social History Narrative  . None   Additional History:    Sleep: Fair  Appetite:  Fair   Assessment: GAD stable, MDD stable   Musculoskeletal: Strength & Muscle Tone: within normal limits Gait & Station: normal Patient leans: N/A   Psychiatric Specialty  Exam: Physical Exam  Review of Systems  Cardiovascular: Negative for chest pain.  Musculoskeletal: Positive for joint pain.  Neurological: Negative for tremors.  Psychiatric/Behavioral: Negative for hallucinations. The patient is not nervous/anxious.     Blood pressure 128/76, pulse 84, resp. rate 18, height 5' 8.5" (1.74 m), weight 236 lb (107 kg), SpO2 97 %.Body mass index is 35.36 kg/m.  General Appearance: Neat and Well Groomed  Engineer, water::  Good  Speech:  Clear and Coherent  Volume:  Normal  Mood:  Euthymic  Affect:  Congruent  Thought Process:  Goal Directed, Intact, Linear and Logical  Orientation:  Full (Time, Place, and Person)  Thought Content:  WDL  Suicidal Thoughts:  No  Homicidal Thoughts:  No  Memory:  Immediate;   Good Recent;   Good Remote;   Good  Judgement:  Good  Insight:  Present  Psychomotor Activity:  Normal  Concentration:  Good  Recall:  Good  Fund of Knowledge:Good  Language: Good  Akathisia:  No  Handed:  Right  AIMS (if indicated):     Assets:  Communication Skills Desire for Improvement Housing Intimacy Leisure Time Physical Health Resilience Social Support Talents/Skills Transportation Vocational/Educational  ADL's:  Intact  Cognition: WNL  Sleep:  Fair  Notes she is sleeping during the day.     Current Medications: Current Outpatient Prescriptions  Medication Sig Dispense Refill  . lamoTRIgine (LAMICTAL) 100 MG tablet Take one tablet at day for mood stabilization. 30 tablet 1  . losartan-hydrochlorothiazide (HYZAAR) 100-25 MG per tablet Take 1 tablet by mouth daily. 30 tablet 5  . venlafaxine XR (EFFEXOR-XR) 75 MG 24 hr capsule Take 3 capsules (225 mg total) by mouth daily with breakfast. 90 capsule 0  . VENTOLIN HFA 108 (90 BASE) MCG/ACT inhaler INHALE 4 PUFFS INTO THE LUNGS 2 TIMES DAILY AS NEEDED. 18 Inhaler 3   No current facility-administered medications for this visit.    Lab Results:  No results found for this or  any previous visit (from the past 48 hour(s)).  Physical Findings: AIMS: CIWA:   COWS:    Treatment Plan Summary: Medication management  Medical Decision Making:  Established Problem, Stable/Improving (1) MDD and anger 1. Will continue Lamictal as written x 4 months. 2. Reviewed sleep hygiene 3. Cautioned the patient about skipping Lamictal. No change in dose needed  anger : lamictal helps.  GAD 1. Will continue Effexor XR at 225mg  po qd for generic. 2. Also information regarding side effects and concern 3. Will write for 3-4 months  More than 50% of time spent in counseling and coordination of care including patient education Reviewed sleep hygiene Call 911 or report local emergency room for any urgent concerns or suicidal thoughts Follow up in 3-4 months   Time spent: 25 minutes Calynn Ferrero De Nurse  9:05 AM 10/12/2015

## 2015-12-20 ENCOUNTER — Ambulatory Visit (INDEPENDENT_AMBULATORY_CARE_PROVIDER_SITE_OTHER): Payer: BLUE CROSS/BLUE SHIELD | Admitting: Family Medicine

## 2015-12-20 ENCOUNTER — Encounter: Payer: Self-pay | Admitting: Family Medicine

## 2015-12-20 ENCOUNTER — Other Ambulatory Visit (HOSPITAL_COMMUNITY)
Admission: RE | Admit: 2015-12-20 | Discharge: 2015-12-20 | Disposition: A | Discharge status: Home / Self Care | Payer: BLUE CROSS/BLUE SHIELD | Source: Ambulatory Visit | Attending: Family Medicine | Admitting: Family Medicine

## 2015-12-20 VITALS — BP 156/70 | HR 73 | Ht 69.0 in | Wt 247.0 lb

## 2015-12-20 DIAGNOSIS — Z124 Encounter for screening for malignant neoplasm of cervix: Secondary | ICD-10-CM

## 2015-12-20 DIAGNOSIS — L821 Other seborrheic keratosis: Secondary | ICD-10-CM

## 2015-12-20 DIAGNOSIS — A630 Anogenital (venereal) warts: Secondary | ICD-10-CM | POA: Diagnosis not present

## 2015-12-20 DIAGNOSIS — Z1151 Encounter for screening for human papillomavirus (HPV): Secondary | ICD-10-CM | POA: Diagnosis not present

## 2015-12-20 DIAGNOSIS — K047 Periapical abscess without sinus: Secondary | ICD-10-CM | POA: Diagnosis not present

## 2015-12-20 DIAGNOSIS — B079 Viral wart, unspecified: Secondary | ICD-10-CM

## 2015-12-20 DIAGNOSIS — Z01419 Encounter for gynecological examination (general) (routine) without abnormal findings: Secondary | ICD-10-CM | POA: Insufficient documentation

## 2015-12-20 MED ORDER — CLINDAMYCIN HCL 300 MG PO CAPS: 300.0000 mg | ORAL_CAPSULE | Freq: Three times a day (TID) | ORAL | 0 refills | Status: DC

## 2015-12-20 NOTE — Progress Notes (Signed)
Subjective:    CC: genital warts  HPI:   45 year old female comes in today with several concerns. First she would like to update her Pap smear. She would also like to have several genital warts frozen today. She also has a mole on her right hand on the orthopedic finger that she would like frozen as well. Just has a small brown lesion on her left facial cheek.  She also would like an antibiotic. On of her upper front teeth is absent. She says it's tender. No fevers or chills. It's becoming quite painful. She has very poor dentition. She does not have currently have dental coverage through her work plans on getting some. She does not currently have a dentist and has not seen one recently. She knows she needs multiple teeth extracted.  Past medical history, Surgical history, Family history not pertinant except as noted below, Social history, Allergies, and medications have been entered into the medical record, reviewed, and corrections made.   Review of Systems: No fevers, chills, night sweats, weight loss, chest pain, or shortness of breath.   Objective:    General: Well Developed, well nourished, and in no acute distress.  Neuro: Alert and oriented x3, extra-ocular muscles intact, sensation grossly intact.  HEENT: Normocephalic, atraumatic  Skin: Warm and dry, no rashes.She has a 5 mm papular wart on the fourth finger on her right hand. She has a slightly smaller 3 mm brown papule consistent with seborrheic keratosis. Cardiac:  no lower extremity edema.  Respiratory:  Not using accessory muscles, speaking in full sentences. GU: She has multiple genital warts on the labia and around the perineum and the rectal area. Vaginal tissue appears normal. No abnormal discharge. Cervix appears normal. Uterus nontender.   Impression and Recommendations:    Cervical cancer screening-Pap smear performed today. Will call with results once available.  Genital warts-cryotherapy performed. Patient  tolerated well.  Wart on finger-cryotherapy performed. Patient tolerated well.  Seborrheic keratosis on left facial cheek-cryotherapy performed. Patient tolerated well.  Dental abscess-we'll treat with clindamycin. But explained that this is only a temporary solution. Will not completely cleared the infection she really needs to have dental work performed. She has significant dental decay.   Cryotherapy Procedure Note  Pre-operative Diagnosis: genital wart  Post-operative Diagnosis: same  Locations: both labia, perineal area and around rectum, also one on her right hand, 4th finger.   Indications: irritation  Anesthesia: not required    Procedure Details  Patient informed of risks (permanent scarring, infection, light or dark discoloration, bleeding, infection, weakness, numbness and recurrence of the lesion) and benefits of the procedure and verbal informed consent obtained.  The areas are treated with liquid nitrogen therapy, frozen until ice ball extended 2-3 mm beyond lesion, allowed to thaw, and treated again. The patient tolerated procedure well.  The patient was instructed on post-op care, warned that there may be blister formation, redness and pain. Recommend OTC analgesia as needed for pain.  Condition: Stable  Complications: none.  Plan: 1. Instructed to keep the area dry and covered for 24-48h and clean thereafter. 2. Warning signs of infection were reviewed.   3. Recommended that the patient use OTC acetaminophen as needed for pain.  4. Return  In 2-3 weeks if needed.

## 2015-12-26 LAB — CYTOLOGY - PAP
Diagnosis: NEGATIVE
HPV: NOT DETECTED

## 2016-01-13 ENCOUNTER — Other Ambulatory Visit: Payer: Self-pay | Admitting: Family Medicine

## 2016-01-19 ENCOUNTER — Ambulatory Visit: Payer: Self-pay | Admitting: Family Medicine

## 2016-01-20 ENCOUNTER — Encounter (HOSPITAL_COMMUNITY): Payer: Self-pay | Admitting: Psychiatry

## 2016-01-20 ENCOUNTER — Ambulatory Visit (INDEPENDENT_AMBULATORY_CARE_PROVIDER_SITE_OTHER): Payer: BLUE CROSS/BLUE SHIELD | Admitting: Psychiatry

## 2016-01-20 DIAGNOSIS — F17210 Nicotine dependence, cigarettes, uncomplicated: Secondary | ICD-10-CM

## 2016-01-20 DIAGNOSIS — F331 Major depressive disorder, recurrent, moderate: Secondary | ICD-10-CM

## 2016-01-20 DIAGNOSIS — F411 Generalized anxiety disorder: Secondary | ICD-10-CM

## 2016-01-20 DIAGNOSIS — Z803 Family history of malignant neoplasm of breast: Secondary | ICD-10-CM | POA: Diagnosis not present

## 2016-01-20 DIAGNOSIS — Z811 Family history of alcohol abuse and dependence: Secondary | ICD-10-CM

## 2016-01-20 DIAGNOSIS — Z818 Family history of other mental and behavioral disorders: Secondary | ICD-10-CM

## 2016-01-20 DIAGNOSIS — Z8371 Family history of colonic polyps: Secondary | ICD-10-CM | POA: Diagnosis not present

## 2016-01-20 MED ORDER — VENLAFAXINE HCL ER 75 MG PO CP24: ORAL_CAPSULE | ORAL | 4 refills | Status: DC

## 2016-01-20 MED ORDER — LAMOTRIGINE 100 MG PO TABS: ORAL_TABLET | ORAL | 4 refills | Status: DC

## 2016-01-20 NOTE — Progress Notes (Signed)
Patient ID: Brooke Kaiser, female   DOB: September 28, 1970, 45 y.o.   MRN: [id number] [facility] MD Progress Note  01/20/2016 1:12 PM Brooke Kaiser  MRN:  [id number] Subjective:  Brooke Kaiser is in to follow up on her GAD and MDD. She has been compliant with her medication and has seen Brooke Kaiser before.   Patient is doing reasonable lives with her girlfriend she got a new job she likes her job depression-wise improvement she is tolerating medication no significant mood swings  Aggravating factor: stress at job at times Modifying factor: medications. Support system Context; anger and stress Duration: 5 years Severity of depression: 7/10. 10 being no depression  No associated psychotic symptoms or mania.  Principal Problem: GAD and MDD Diagnosis:   Patient Active Problem List   Diagnosis Date Noted  . Gouty arthritis [M10.9] 11/04/2013  . ANA positive [R76.8] 09/22/2013  . Muscle ache [M79.1] 09/22/2013  . Collagen disease ([place]) [M35.9] 09/22/2013  . Elevated erythrocyte sedimentation rate [R70.0] 09/22/2013  . IFG (impaired fasting glucose) [R73.01] 06/24/2012  . Asthmatic bronchitis [J45.909] 10/16/2011  . GAD (generalized anxiety disorder) [F41.1] 07/30/2011  . Essential hypertension [I10] 04/07/2010  . UNSPECIFIED ANEMIA [D64.9] 03/29/2010  . Major depressive disorder, recurrent episode, severe, without mention of psychotic behavior [F33.2] 07/22/2009  . OTHER ABNORMAL GLUCOSE [R73.09] 05/25/2008  . HYPERTRIGLYCERIDEMIA [E78.1] 11/04/2007  . FATIGUE [R53.81, R53.83] 11/04/2007  . EXTERNAL HEMORRHOIDS [K64.4] 07/22/2007  . BACK PAIN [M54.9] 07/22/2007  . TOBACCO ABUSE [F17.200] 07/01/2007  . GERD [K21.9] 07/01/2007  . ABNORMAL THYROID FUNCTION TESTS [R94.6] 04/22/2007   Total Time spent with patient: 25 minutes   Past Medical History:  Past Medical History:  Diagnosis Date  . Abnormal thyroid blood test   . Anemia   . Anxiety   . Depression   . Hypertension   . Tobacco abuse      Past Surgical History:  Procedure Laterality Date  . 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 Cancer Mother 48    Breast  . Colon polyps Mother   . Other Mother     colon polyps  . Dementia Mother   . Anxiety disorder Mother   . Depression Mother   . Paranoid behavior Mother   . Depression Father   . Suicidality Father     commited suicide  . Alcohol abuse Father   . Colon polyps Maternal Aunt     cancerous colon polyps  . Cancer Maternal Uncle     cancerous colon polyps  . Heart disease Other   . Other Other     thyroid disorder  . Alcohol abuse Paternal Uncle   . Dementia Maternal Grandmother   . Alcohol abuse Paternal Grandmother   . Alcohol abuse Paternal Uncle    Social History:  History  Alcohol Use No     History  Drug Use No    Social History   Social History  . Marital status: Single    Spouse name: N/A  . Number of children: N/A  . Years of education: N/A   Social History Main Topics  . Smoking status: Current Every Day Smoker    Packs/day: 0.50    Years: 27.00    Types: Cigarettes  . Smokeless tobacco: Never Used     Comment: pt is reducing # of cigarettes per day  . Alcohol use No  . Drug  use: No  . Sexual activity: Yes    Partners: Female     Comment: prefers female sex partner   Other Topics Concern  . None   Social History Narrative  . None   Additional History:    Sleep: Fair  Appetite:  Fair   Assessment: GAD stable, MDD stable      Psychiatric Specialty Exam: Physical Exam  Review of Systems  Cardiovascular: Negative for chest pain and palpitations.  Neurological: Negative for tremors.  Psychiatric/Behavioral: Negative for depression and hallucinations. The patient is not nervous/anxious.     There were no vitals taken for this visit.There is no height or weight on file to calculate BMI.  General Appearance: Neat and Well  Groomed  Engineer, water::  Good  Speech:  Clear and Coherent  Volume:  Normal  Mood:  euthymic  Affect:  Congruent  Thought Process:  Goal Directed, Intact, Linear and Logical  Orientation:  Full (Time, Place, and Person)  Thought Content:  WDL  Suicidal Thoughts:  No  Homicidal Thoughts:  No  Memory:  Immediate;   Good Recent;   Good Remote;   Good  Judgement:  Good  Insight:  Present  Psychomotor Activity:  Normal  Concentration:  Good  Recall:  Good  Fund of Knowledge:Good  Language: Good  Akathisia:  No  Handed:  Right  AIMS (if indicated):     Assets:  Communication Skills Desire for Improvement Housing Intimacy Leisure Time Physical Health Resilience Social Support Talents/Skills Transportation Vocational/Educational  ADL's:  Intact  Cognition: WNL  Sleep:  Fair  Notes she is sleeping during the day.     Current Medications: Current Outpatient Prescriptions  Medication Sig Dispense Refill  . lamoTRIgine (LAMICTAL) 100 MG tablet Take one tablet at day for mood stabilization. 30 tablet 1  . losartan-hydrochlorothiazide (HYZAAR) 100-25 MG per tablet Take 1 tablet by mouth daily. 30 tablet 5  . venlafaxine XR (EFFEXOR-XR) 75 MG 24 hr capsule Take 3 capsules (225 mg total) by mouth daily with breakfast. 90 capsule 0  . VENTOLIN HFA 108 (90 BASE) MCG/ACT inhaler INHALE 4 PUFFS INTO THE LUNGS 2 TIMES DAILY AS NEEDED. 18 Inhaler 3   No current facility-administered medications for this visit.    Lab Results:  No results found for this or any previous visit (from the past 48 hour(s)).  Physical Findings: AIMS: CIWA:   COWS:    Treatment Plan Summary: Medication management  Medical Decision Making:  Established Problem, Stable/Improving (1) MDD and anger 1. Will continue Lamictal as written x 5 months. No rash 2. Reviewed sleep hygiene 3. Cautioned the patient about skipping Lamictal. No change in dose needed  anger : lamictal helps.  GAD 1. Will  continue Effexor XR at 225mg  po qd for generic. 5 months prescription written 2. Also information regarding side effects and concern  More than 50% of time spent in counseling and coordination of care including patient education Reviewed sleep hygiene Call 911 or report local emergency room for any urgent concerns or suicidal thoughts Follow up in 5  months   Time spent: 25 minutes Desirae Mancusi  1:12 PM 01/20/2016

## 2016-02-03 ENCOUNTER — Ambulatory Visit (HOSPITAL_COMMUNITY): Payer: Self-pay | Admitting: Psychiatry

## 2016-02-06 ENCOUNTER — Ambulatory Visit (INDEPENDENT_AMBULATORY_CARE_PROVIDER_SITE_OTHER): Payer: Managed Care, Other (non HMO) | Admitting: Family Medicine

## 2016-02-06 ENCOUNTER — Encounter: Payer: Self-pay | Admitting: Family Medicine

## 2016-02-06 VITALS — BP 163/84 | HR 73 | Temp 99.0°F | Wt 245.0 lb

## 2016-02-06 DIAGNOSIS — M273 Alveolitis of jaws: Secondary | ICD-10-CM

## 2016-02-06 MED ORDER — CEFDINIR 300 MG PO CAPS: 300.0000 mg | ORAL_CAPSULE | Freq: Two times a day (BID) | ORAL | 0 refills | Status: DC

## 2016-02-06 MED ORDER — TRAMADOL HCL 50 MG PO TABS: 50.0000 mg | ORAL_TABLET | Freq: Three times a day (TID) | ORAL | 0 refills | Status: DC | PRN

## 2016-02-06 MED ORDER — TRIAMCINOLONE ACETONIDE 0.1 % MT PSTE: 1.0000 "application " | PASTE | Freq: Two times a day (BID) | OROMUCOSAL | 11 refills | Status: DC

## 2016-02-06 NOTE — Patient Instructions (Signed)
Thank you for coming in today. Follow up with your dentist or oral surgeon ASAP.  Use tramadol sparingly.   Follow up with your PCP about the blood pressure in the near future.

## 2016-02-06 NOTE — Progress Notes (Signed)
Brooke Kaiser is a 46 y.o. female who presents to [place] today for mouth pain.  She had pain and throbbing at the site of a rotting tooth 2.5 months ago, saw Dr. Madilyn Fireman, and was given clindamycin. Symptoms resolved, then returned and she went to urgent care and was given more clindamycin. A few days later, she noticed a painful sac at the roof of her mouth. She got the rotting tooth pulled at the dentist the next day along with 2 adjacent teeth. Since then, she has continued taking clindamycin and no longer has pain at the tooth site, but endorses pain at the roof of her mouth and the left submandibular area, as well as generalized soreness around the left sinus, neck, and eye. No fevers or discharge.   Past Medical History:  Diagnosis Date  . Abnormal thyroid blood test   . Anemia   . Anxiety   . Depression   . Hypertension   . Tobacco abuse    Past Surgical History:  Procedure Laterality Date  . 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 Smoking status: Current Every Day Smoker    Packs/day: 0.50    Years: 27.00    Types: Cigarettes  . Smokeless tobacco: Never Used     Comment: pt is reducing # of cigarettes per day  . Alcohol use No     ROS:  As above   Medications: Current Outpatient Prescriptions  Medication Sig Dispense Refill  . clindamycin (CLEOCIN) 300 MG capsule Take 1 capsule (300 mg total) by mouth 3 (three) times daily. X 10 days 30 capsule 0  . lamoTRIgine (LAMICTAL) 100 MG tablet TAKE ONE TABLET AT DAY FOR MOOD STABILIZATION. 30 tablet 4  . losartan-hydrochlorothiazide (HYZAAR) 100-25 MG tablet TAKE 1 TABLET BY MOUTH EVERY DAY 30 tablet 5  . venlafaxine XR (EFFEXOR-XR) 75 MG 24 hr capsule TAKE 3 CAPSULES (225 MG TOTAL) BY MOUTH DAILY WITH BREAKFAST. 90 capsule 4  . VENTOLIN HFA 108 (90 BASE) MCG/ACT inhaler INHALE 4 PUFFS INTO THE LUNGS 2 TIMES  DAILY AS NEEDED. 18 Inhaler 3  . cefdinir (OMNICEF) 300 MG capsule Take 1 capsule (300 mg total) by mouth 2 (two) times daily. 14 capsule 0  . traMADol (ULTRAM) 50 MG tablet Take 1 tablet (50 mg total) by mouth every 8 (eight) hours as needed. 15 tablet 0  . triamcinolone (KENALOG) 0.1 % paste Use as directed 1 application in the mouth or throat 2 (two) times daily. 5 g 11   No current facility-administered medications for this visit.    Allergies  Allergen Reactions  . Clavulanic Acid Nausea And Vomiting  . Codeine Nausea And Vomiting  . [place] [Trospium] Other (See Comments)    Dizziness     Exam:  BP (!) 163/84   Pulse 73   Temp 99 F (37.2 C)   Wt 245 lb (111.1 kg)   BMI 36.18 kg/m  General: Well Developed, well nourished, and in no acute distress.  HEENT: Frontal and maxillary facial areas mildly tender to palpation. Submandibular area tender to palpation without palpable lympadenopathy. No anterior cervical or supraclavicular lymphadenopathy. Oral: Ulcers and gingivitis noted over site of 3 left lateral incisor sockets. Yellow ulceration over 1 cm mass on hard palate. Several rotting teeth observed throughout oral cavity. Neuro/Psych: Alert and oriented x3, extra-ocular muscles intact, able to move all 4 extremities,  sensation grossly intact. Skin: Warm and dry, no rashes noted.  Respiratory: Not using accessory muscles, speaking in full sentences, trachea midline.  Cardiovascular: Pulses palpable, no extremity edema. Abdomen: Does not appear distended.    No results found for this or any previous visit (from the past 48 hour(s)). No results found.    Assessment and Plan: 46 y.o. female with lingering oral infection after tooth extraction that appears to be unresponsive to clindamycin. This is likely a chronic infection that will recur in the setting of multiple rotting teeth, and generalized left facial pain is likely referred from the infection site. Hard palate  mass may be due to ulceration in setting of infection; also consider the possibility of squamous cell carcinoma given patient's smoking history. - Omnicef - Pain control with tramadol and topical triamcinolone - Follow up with dentist to address rotting teeth and hard palate mass  Patient was reviewed in the [place] Controlled Substances Database.  No orders of the defined types were placed in this encounter.   Discussed warning signs or symptoms. Please see discharge instructions. Patient expresses understanding.

## 2016-02-07 ENCOUNTER — Ambulatory Visit: Payer: Self-pay | Admitting: Family Medicine

## 2016-02-08 ENCOUNTER — Ambulatory Visit: Payer: Self-pay | Admitting: Family Medicine

## 2016-04-12 ENCOUNTER — Other Ambulatory Visit: Payer: Self-pay | Admitting: Family Medicine

## 2016-05-16 ENCOUNTER — Telehealth: Payer: Self-pay | Admitting: Family Medicine

## 2016-05-16 NOTE — Telephone Encounter (Signed)
Called pt and left a message stating she is due for a f/u on BP with Dr. Madilyn Fireman

## 2016-06-08 ENCOUNTER — Ambulatory Visit (HOSPITAL_COMMUNITY): Payer: Self-pay | Admitting: Psychiatry

## 2016-06-22 ENCOUNTER — Ambulatory Visit (INDEPENDENT_AMBULATORY_CARE_PROVIDER_SITE_OTHER): Payer: Managed Care, Other (non HMO) | Admitting: Psychiatry

## 2016-06-22 ENCOUNTER — Ambulatory Visit (INDEPENDENT_AMBULATORY_CARE_PROVIDER_SITE_OTHER): Payer: Managed Care, Other (non HMO) | Admitting: Family Medicine

## 2016-06-22 VITALS — BP 141/77 | HR 67 | Wt 241.0 lb

## 2016-06-22 DIAGNOSIS — F17210 Nicotine dependence, cigarettes, uncomplicated: Secondary | ICD-10-CM | POA: Diagnosis not present

## 2016-06-22 DIAGNOSIS — I10 Essential (primary) hypertension: Secondary | ICD-10-CM

## 2016-06-22 DIAGNOSIS — F411 Generalized anxiety disorder: Secondary | ICD-10-CM | POA: Diagnosis not present

## 2016-06-22 DIAGNOSIS — R454 Irritability and anger: Secondary | ICD-10-CM | POA: Diagnosis not present

## 2016-06-22 DIAGNOSIS — R21 Rash and other nonspecific skin eruption: Secondary | ICD-10-CM

## 2016-06-22 DIAGNOSIS — R7309 Other abnormal glucose: Secondary | ICD-10-CM

## 2016-06-22 DIAGNOSIS — D500 Iron deficiency anemia secondary to blood loss (chronic): Secondary | ICD-10-CM

## 2016-06-22 DIAGNOSIS — R61 Generalized hyperhidrosis: Secondary | ICD-10-CM

## 2016-06-22 DIAGNOSIS — F331 Major depressive disorder, recurrent, moderate: Secondary | ICD-10-CM | POA: Diagnosis not present

## 2016-06-22 DIAGNOSIS — R7301 Impaired fasting glucose: Secondary | ICD-10-CM | POA: Diagnosis not present

## 2016-06-22 LAB — POCT GLYCOSYLATED HEMOGLOBIN (HGB A1C): HEMOGLOBIN A1C: 5.7

## 2016-06-22 MED ORDER — VENLAFAXINE HCL ER 75 MG PO CP24: ORAL_CAPSULE | ORAL | 4 refills | Status: DC

## 2016-06-22 MED ORDER — LAMOTRIGINE 100 MG PO TABS: ORAL_TABLET | ORAL | 4 refills | Status: DC

## 2016-06-22 MED ORDER — LOSARTAN POTASSIUM 100 MG PO TABS: 100.0000 mg | ORAL_TABLET | Freq: Every day | ORAL | 1 refills | Status: DC

## 2016-06-22 MED ORDER — ALBUTEROL SULFATE HFA 108 (90 BASE) MCG/ACT IN AERS: INHALATION_SPRAY | RESPIRATORY_TRACT | 3 refills | Status: DC

## 2016-06-22 NOTE — Patient Instructions (Signed)
Schedule with me in 6 months.   

## 2016-06-22 NOTE — Progress Notes (Signed)
Patient ID: BRITTANNY LEVENHAGEN, female   DOB: April 17, 1970, 46 y.o.   MRN: [id number] [facility] MD Progress Note  06/22/2016 10:50 AM AKIYA MORR  MRN:  [id number] Subjective:  Naraly is in to follow up on her GAD and MDD. She has been compliant with her medication and has seen Lovenia Shuck before.    Patient is doing reasonable. Her girlfriend also came in for a minute.  Aggravating factor moms sickness and grandmother's sickness modifying factors her support system her medications  Severity of depression is improving her anger issues are less bothersome since she is normal on Lamictal there is no rash reported. Effexor helps anxiety  Severity of depression: 8/10 No associated psychotic symptoms or mania.  Principal Problem: GAD and MDD Diagnosis:   Patient Active Problem List   Diagnosis Date Noted  . Gouty arthritis [M10.9] 11/04/2013  . ANA positive [R76.8] 09/22/2013  . Muscle ache [M79.1] 09/22/2013  . Collagen disease ([place]) [M35.9] 09/22/2013  . Elevated erythrocyte sedimentation rate [R70.0] 09/22/2013  . IFG (impaired fasting glucose) [R73.01] 06/24/2012  . Asthmatic bronchitis [J45.909] 10/16/2011  . GAD (generalized anxiety disorder) [F41.1] 07/30/2011  . Essential hypertension [I10] 04/07/2010  . UNSPECIFIED ANEMIA [D64.9] 03/29/2010  . Major depressive disorder, recurrent episode, severe, without mention of psychotic behavior [F33.2] 07/22/2009  . OTHER ABNORMAL GLUCOSE [R73.09] 05/25/2008  . HYPERTRIGLYCERIDEMIA [E78.1] 11/04/2007  . FATIGUE [R53.81, R53.83] 11/04/2007  . EXTERNAL HEMORRHOIDS [K64.4] 07/22/2007  . BACK PAIN [M54.9] 07/22/2007  . TOBACCO ABUSE [F17.200] 07/01/2007  . GERD [K21.9] 07/01/2007  . ABNORMAL THYROID FUNCTION TESTS [R94.6] 04/22/2007   Total Time spent with patient: 25 minutes   Past Medical History:  Past Medical History:  Diagnosis Date  . Abnormal thyroid blood test   . Anemia   . Anxiety   . Depression   . Hypertension   . Tobacco  abuse     Past Surgical History:  Procedure Laterality Date  . 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 Cancer Mother 28       Breast  . Colon polyps Mother   . Other Mother        colon polyps  . Dementia Mother   . Anxiety disorder Mother   . Depression Mother   . Paranoid behavior Mother   . Depression Father   . Suicidality Father        commited suicide  . Alcohol abuse Father   . Colon polyps Maternal Aunt        cancerous colon polyps  . Cancer Maternal Uncle        cancerous colon polyps  . Heart disease Other   . Other Other        thyroid disorder  . Alcohol abuse Paternal Uncle   . Dementia Maternal Grandmother   . Alcohol abuse Paternal Grandmother   . Alcohol abuse Paternal Uncle    Social History:  History  Alcohol Use No     History  Drug Use No    Social History   Social History  . Marital status: Single    Spouse name: N/A  . Number of children: N/A  . Years of education: N/A   Social History Main Topics  . Smoking status: Current Every Day Smoker    Packs/day: 0.50    Years: 27.00    Types: Cigarettes  . Smokeless tobacco: Never Used  Comment: pt is reducing # of cigarettes per day  . Alcohol use No  . Drug use: No  . Sexual activity: Yes    Partners: Female     Comment: prefers female sex partner   Other Topics Concern  . Not on file   Social History Narrative  . No narrative on file   Additional History:    Sleep: Fair  Appetite:  Fair   Assessment: GAD stable, MDD stable      Psychiatric Specialty Exam: Physical Exam  Review of Systems  Cardiovascular: Negative for chest pain and palpitations.  Gastrointestinal: Negative for nausea.  Neurological: Negative for tremors.  Psychiatric/Behavioral: Negative for depression and hallucinations. The patient is not nervous/anxious.     There were no vitals taken for this  visit.There is no height or weight on file to calculate BMI.  General Appearance: Neat and Well Groomed  Engineer, water::  Good  Speech:  Clear and Coherent  Volume:  Normal  Mood:  euthymic  Affect:  reactive  Thought Process:  Goal Directed, Intact, Linear and Logical  Orientation:  Full (Time, Place, and Person)  Thought Content:  WDL  Suicidal Thoughts:  No  Homicidal Thoughts:  No  Memory:  Immediate;   Good Recent;   Good Remote;   Good  Judgement:  Good  Insight:  Present  Psychomotor Activity:  Normal  Concentration:  Good  Recall:  Good  Fund of Knowledge:Good  Language: Good  Akathisia:  No  Handed:  Right  AIMS (if indicated):     Assets:  Communication Skills Desire for [place] Talents/Skills Transportation Vocational/Educational  ADL's:  Intact  Cognition: WNL  Sleep:  Fair  Notes she is sleeping during the day.     Current Medications: Current Outpatient Prescriptions  Medication Sig Dispense Refill  . lamoTRIgine (LAMICTAL) 100 MG tablet Take one tablet at day for mood stabilization. 30 tablet 1  . losartan-hydrochlorothiazide (HYZAAR) 100-25 MG per tablet Take 1 tablet by mouth daily. 30 tablet 5  . venlafaxine XR (EFFEXOR-XR) 75 MG 24 hr capsule Take 3 capsules (225 mg total) by mouth daily with breakfast. 90 capsule 0  . VENTOLIN HFA 108 (90 BASE) MCG/ACT inhaler INHALE 4 PUFFS INTO THE LUNGS 2 TIMES DAILY AS NEEDED. 18 Inhaler 3   No current facility-administered medications for this visit.    Lab Results:  No results found for this or any previous visit (from the past 48 hour(s)).  Physical Findings: AIMS: CIWA:   COWS:    Treatment Plan Summary: Medication management   MDD : improving. Continue effexor Mood disorder: lamictal helps mood. No rash. Will continue  GAD: not worse. Continue effexor Provided supportive therapy in regard to her mom condition and  if she would make appointment for help FU 4-5 months. Renewed meds, questions addressed   Merian Capron  10:50 AM 06/22/2016

## 2016-06-22 NOTE — Progress Notes (Signed)
Subjective:    CC: HTN, IFG   HPI:  Hypertension- Pt denies chest pain, SOB, dizziness, or heart palpitations.   Denies medication side effects.  She is actually been out of her medication for a couple of weeks now.  Impaired fasting glucose-no increased thirst or urination. No symptoms consistent with hypoglycemia.  Also complains of a rash on her arm today.She says it's in the healing phase now but it started about 3-4 weeks ago just on her left posterior forearm. She now works outside Photographer and marking them. Says she's outdoors all day. Sometimes she does have to enter wooded areas. She says that it actually developed blisters at one point. She is over-the-counter hydrocortisone and it's been slowly getting better. She was concerned because she wanted to make sure that it didn't look like skin cancer. She has been trying to wear 50 SPF sunscreen well outside.  She also complains of excessive sweating today. She says she's been that way most of her life but she does like it's gotten a little bit worse than it used to be. She always feels hot. Again that is not new.  She also reports that she hasn't had a period since last fall. Enema 6 months. Before that she was having palms with heavy frequent bleeding with her menstrual cycle. The point where she was actually becoming iron deficient.  Past medical history, Surgical history, Family history not pertinant except as noted below, Social history, Allergies, and medications have been entered into the medical record, reviewed, and corrections made.   Review of Systems: No fevers, chills, night sweats, weight loss, chest pain, or shortness of breath.   Objective:    General: Well Developed, well nourished, and in no acute distress.  Neuro: Alert and oriented x3, extra-ocular muscles intact, sensation grossly intact.  HEENT: Normocephalic, atraumatic  Skin: Warm and dry, no rashes. Cardiac: Regular rate and rhythm, no murmurs  rubs or gallops, no lower extremity edema.  Respiratory: Clear to auscultation bilaterally. Not using accessory muscles, speaking in full sentences.   Impression and Recommendations:    HTN -Uncontrolled. Will restart losartan without the hydrochlorothiazide since she now works outside. Chlorothiazide could increase her risk for dehydration the summer as well as increase her sun sensitivity. Follow-up in 2-3 weeks for nurse blood pressure check. Otherwise I will see her back in 6 months.  IFG - 11 A1c down to 5.7. It's improved from last year. Continue work on [employer] and regular exercise. Reviewed again with her to avoid sweet drinks beverages and foods and really look at her portion control on carbohydrates.    Rash-suspect contact dermatitis. At this point it looks like it's in the healing phase. Gave her reassurance. If it happens again okay to use hydrocortisone and Caladryl lotion. If it's more severe she's always welcome to come in for [place] to look at it and treat it. I did encourage her to make sure that she is wearing her sunscreen and covering it with a hat and she's now working outdoors to prevent skin cancer but I do not see any signs of skin cancer today.  Excessive sweating-if her reassurance. I really don't see anything worrisome about the sweating especially if she's been that way for most fertile life.  Iron deficiency anemiread  -due to recheck CBC and iron.   She also requests to have a vitamin D checked

## 2016-07-16 ENCOUNTER — Encounter: Payer: Self-pay | Admitting: Family Medicine

## 2016-07-16 ENCOUNTER — Ambulatory Visit (INDEPENDENT_AMBULATORY_CARE_PROVIDER_SITE_OTHER): Payer: Managed Care, Other (non HMO) | Admitting: Family Medicine

## 2016-07-16 VITALS — BP 125/72 | HR 75 | Wt 242.0 lb

## 2016-07-16 DIAGNOSIS — L089 Local infection of the skin and subcutaneous tissue, unspecified: Secondary | ICD-10-CM | POA: Diagnosis not present

## 2016-07-16 DIAGNOSIS — T148XXA Other injury of unspecified body region, initial encounter: Secondary | ICD-10-CM | POA: Diagnosis not present

## 2016-07-16 MED ORDER — DOXYCYCLINE HYCLATE 100 MG PO TABS: 100.0000 mg | ORAL_TABLET | Freq: Two times a day (BID) | ORAL | 0 refills | Status: DC

## 2016-07-16 NOTE — Patient Instructions (Signed)
Thank you for coming in today. START doxycycline.  Take it with a little bit of food twice daily.  Do not take with a lot of milk, calcium or iron.   Recheck if worsening.    Cellulitis, Adult Cellulitis is a skin infection. The infected area is usually red and sore. This condition occurs most often in the arms and lower legs. It is very important to get treated for this condition. Follow these instructions at home:  Take over-the-counter and prescription medicines only as told by your doctor.  If you were prescribed an antibiotic medicine, take it as told by your doctor. Do not stop taking the antibiotic even if you start to feel better.  Drink enough fluid to keep your pee (urine) clear or pale yellow.  Do not touch or rub the infected area.  Raise (elevate) the infected area above the level of your heart while you are sitting or lying down.  Place warm or cold wet cloths (warm or cold compresses) on the infected area. Do this as told by your doctor.  Keep all follow-up visits as told by your doctor. This is important. These visits let your doctor make sure your infection is not getting worse. Contact a doctor if:  You have a fever.  Your symptoms do not get better after 1-2 days of treatment.  Your bone or joint under the infected area starts to hurt after the skin has healed.  Your infection comes back. This can happen in the same area or another area.  You have a swollen bump in the infected area.  You have new symptoms.  You feel ill and also have muscle aches and pains. Get help right away if:  Your symptoms get worse.  You feel very sleepy.  You throw up (vomit) or have watery poop (diarrhea) for a long time.  There are red streaks coming from the infected area.  Your red area gets larger.  Your red area turns darker. This information is not intended to replace advice given to you by your health care provider. Make sure you discuss any questions you have  with your health care provider. Document Released: 07/04/2007 Document Revised: 06/23/2015 Document Reviewed: 11/24/2014 Elsevier Interactive Patient Education  2018 [employer].

## 2016-07-17 NOTE — Progress Notes (Signed)
Brooke Kaiser is a 46 y.o. female who presents to [facility] today for left forearm laceration infection. Patient suffered a laceration to her left forearm about a week ago. She was seen in urgent care in [place] where she received several simple interrupted stitches. She notes that when she lacerated her arm the wound was contaminated with dirt. The wound was irrigated and cleaned with Betadine. She notes however over the past several days the wound is becoming red and itchy and she thinks it may be infected. She denies any fevers or chills nausea vomiting or diarrhea.   Past Medical History:  Diagnosis Date  . Abnormal thyroid blood test   . Anemia   . Anxiety   . Depression   . Hypertension   . Tobacco abuse    Past Surgical History:  Procedure Laterality Date  . 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 Smoking status: Current Every Day Smoker    Packs/day: 0.50    Years: 27.00    Types: Cigarettes  . Smokeless tobacco: Never Used     Comment: pt is reducing # of cigarettes per day  . Alcohol use No   family history includes Alcohol abuse in her father, paternal grandmother, paternal uncle, and paternal uncle; Anxiety disorder in her mother; Cancer in her maternal uncle; Cancer (age of onset: 45) in her mother; Colon polyps in her maternal aunt and mother; Dementia in her maternal grandmother and mother; Depression in her father and mother; Heart disease in her other; Other in her mother and other; Paranoid behavior in her mother; Suicidality in her father.  ROS as above:  Medications: Current Outpatient Prescriptions  Medication Sig Dispense Refill  . albuterol (VENTOLIN HFA) 108 (90 Base) MCG/ACT inhaler INHALE 2-4 PUFFS INTO THE LUNGS 2 TIMES DAILY AS NEEDED. 18 Inhaler 3  . lamoTRIgine  (LAMICTAL) 100 MG tablet TAKE ONE TABLET AT DAY FOR MOOD STABILIZATION. 30 tablet 4  . losartan (COZAAR) 100 MG tablet Take 1 tablet (100 mg total) by mouth daily. 90 tablet 1  . triamcinolone (KENALOG) 0.1 % paste Use as directed 1 application in the mouth or throat 2 (two) times daily. 5 g 11  . venlafaxine XR (EFFEXOR-XR) 75 MG 24 hr capsule TAKE 3 CAPSULES (225 MG TOTAL) BY MOUTH DAILY WITH BREAKFAST. 90 capsule 4  . doxycycline (VIBRA-TABS) 100 MG tablet Take 1 tablet (100 mg total) by mouth 2 (two) times daily. 14 tablet 0   No current facility-administered medications for this visit.    Allergies  Allergen Reactions  . Clavulanic Acid Nausea And Vomiting  . Codeine Nausea And Vomiting  . Sanctura [Trospium] Other (See Comments)    Dizziness    Health Maintenance Health Maintenance  Topic Date Due  . INFLUENZA VACCINE  08/29/2016  . TETANUS/TDAP  12/10/2018  . PAP SMEAR  12/19/2020  . HIV Screening  Completed     Exam:  BP 125/72   Pulse 75   Wt 242 lb (109.8 kg)   BMI 35.74 kg/m  Gen: Well NAD HEENT: EOMI,  MMM Lungs: Normal work of breathing. CTABL Heart: RRR no MRG Abd: NABS, Soft. Nondistended, Nontender Exts: Brisk capillary refill, warm and well perfused.  Skin: Left forearm laceration closed with 5 simple interrupted sutures. The skin is erythematous with small papules or vesicles present. No discharge. Not  particularly tender.  The sutures were removed and the wound is cleaned with alcohol and cultured for bacteria and herpes and VZV.   No results found for this or any previous visit (from the past 72 hour(s)). No results found.    Assessment and Plan: 46 y.o. female with infection of wound following sutures. Likely superficial infection versus allergy to Neosporin. Potential viral infection with herpes or shingles even as possible. Culture is pending. Recheck if not improving.   Orders Placed This Encounter  Procedures  . Wound culture    Order  Specific Question:   Source    Answer:   left forearm  . Herpes simplex virus culture    Order Specific Question:   Source    Answer:   left forearm. include vzv   Meds ordered this encounter  Medications  . doxycycline (VIBRA-TABS) 100 MG tablet    Sig: Take 1 tablet (100 mg total) by mouth 2 (two) times daily.    Dispense:  14 tablet    Refill:  0     Discussed warning signs or symptoms. Please see discharge instructions. Patient expresses understanding.

## 2016-07-20 LAB — WOUND CULTURE
Gram Stain: NONE SEEN
Gram Stain: NONE SEEN
Gram Stain: NONE SEEN

## 2016-07-20 MED ORDER — CIPROFLOXACIN HCL 500 MG PO TABS: 500.0000 mg | ORAL_TABLET | Freq: Two times a day (BID) | ORAL | 0 refills | Status: DC

## 2016-07-20 NOTE — Addendum Note (Signed)
Addended by: Gregor Hams on: 07/20/2016 01:37 PM   Modules accepted: Orders

## 2016-08-06 ENCOUNTER — Telehealth: Payer: Self-pay | Admitting: *Deleted

## 2016-08-06 NOTE — Telephone Encounter (Signed)
Patient states she was bitten by a lone star tick. She immediately pulled the tick off. She wants to know what she should look out for. Please advise

## 2016-08-06 NOTE — Telephone Encounter (Signed)
Look for rash, fever or increase in headaches. Ticks are very common this year.  If you experience any of these then please let me know.   Dr Jerilynn Mages.

## 2016-08-07 ENCOUNTER — Telehealth: Payer: Self-pay | Admitting: Family Medicine

## 2016-08-07 NOTE — Telephone Encounter (Signed)
I called pt back and explained that we never sent the viral culture.  She is feeling better and is OK with the plan of watchful waiting.

## 2016-08-07 NOTE — Telephone Encounter (Signed)
Patient notified

## 2016-09-14 LAB — TSH: TSH: 0.92 mIU/L

## 2016-09-14 LAB — CBC
HEMATOCRIT: 42.1 % (ref 35.0–45.0)
Hemoglobin: 14.3 g/dL (ref 11.7–15.5)
MCH: 30.7 pg (ref 27.0–33.0)
MCHC: 34 g/dL (ref 32.0–36.0)
MCV: 90.3 fL (ref 80.0–100.0)
MPV: 9.6 fL (ref 7.5–12.5)
Platelets: 273 10*3/uL (ref 140–400)
RBC: 4.66 MIL/uL (ref 3.80–5.10)
RDW: 13.7 % (ref 11.0–15.0)
WBC: 6.5 10*3/uL (ref 3.8–10.8)

## 2016-09-15 LAB — COMPLETE METABOLIC PANEL WITH GFR
ALBUMIN: 4 g/dL (ref 3.6–5.1)
ALK PHOS: 58 U/L (ref 33–115)
ALT: 18 U/L (ref 6–29)
AST: 12 U/L (ref 10–35)
BUN: 14 mg/dL (ref 7–25)
CALCIUM: 8.9 mg/dL (ref 8.6–10.2)
CO2: 22 mmol/L (ref 20–32)
Chloride: 106 mmol/L (ref 98–110)
Creat: 0.72 mg/dL (ref 0.50–1.10)
GFR, Est Non African American: >89 mL/min (ref >=60)
Glucose, Bld: 102 mg/dL — ABNORMAL HIGH (ref 65–99)
POTASSIUM: 4.4 mmol/L (ref 3.5–5.3)
Sodium: 138 mmol/L (ref 135–146)
Total Bilirubin: 0.5 mg/dL (ref 0.2–1.2)
Total Protein: 6.2 g/dL (ref 6.1–8.1)

## 2016-09-15 LAB — VITAMIN D 25 HYDROXY (VIT D DEFICIENCY, FRACTURES): VIT D 25 HYDROXY: 31 ng/mL (ref 30–100)

## 2016-09-15 LAB — LIPID PANEL
CHOL/HDL RATIO: 5.2 ratio — AB (ref <5.0)
CHOLESTEROL: 157 mg/dL (ref <200)
HDL: 30 mg/dL — AB (ref >50)
LDL Cholesterol: 94 mg/dL (ref <100)
TRIGLYCERIDES: 165 mg/dL — AB (ref <150)
VLDL: 33 mg/dL — ABNORMAL HIGH (ref <30)

## 2016-09-15 LAB — FERRITIN: Ferritin: 70 ng/mL (ref 10–232)

## 2016-09-15 LAB — IRON: Iron: 106 ug/dL (ref 40–190)

## 2016-11-15 ENCOUNTER — Ambulatory Visit (HOSPITAL_COMMUNITY): Payer: Self-pay | Admitting: Psychiatry

## 2016-11-30 ENCOUNTER — Ambulatory Visit (HOSPITAL_COMMUNITY): Payer: Self-pay | Admitting: Psychiatry

## 2016-12-06 ENCOUNTER — Encounter (HOSPITAL_COMMUNITY): Payer: Self-pay | Admitting: Psychiatry

## 2016-12-06 ENCOUNTER — Ambulatory Visit (INDEPENDENT_AMBULATORY_CARE_PROVIDER_SITE_OTHER): Payer: 59 | Admitting: Psychiatry

## 2016-12-06 VITALS — BP 126/72 | HR 76 | Resp 16 | Ht 68.5 in | Wt 248.8 lb

## 2016-12-06 DIAGNOSIS — R454 Irritability and anger: Secondary | ICD-10-CM

## 2016-12-06 DIAGNOSIS — F411 Generalized anxiety disorder: Secondary | ICD-10-CM | POA: Diagnosis not present

## 2016-12-06 DIAGNOSIS — F17210 Nicotine dependence, cigarettes, uncomplicated: Secondary | ICD-10-CM | POA: Diagnosis not present

## 2016-12-06 DIAGNOSIS — F331 Major depressive disorder, recurrent, moderate: Secondary | ICD-10-CM

## 2016-12-06 MED ORDER — VENLAFAXINE HCL ER 75 MG PO CP24: ORAL_CAPSULE | ORAL | 4 refills | Status: DC

## 2016-12-06 MED ORDER — LAMOTRIGINE 100 MG PO TABS: ORAL_TABLET | ORAL | 4 refills | Status: DC

## 2016-12-06 NOTE — Progress Notes (Signed)
Patient ID: Brooke Kaiser, female   DOB: [birth year], 46 y.o.   MRN: [id number] [facility] MD Progress Note  12/06/2016 9:03 AM Brooke Kaiser  MRN:  [id number] Subjective:  Brooke Kaiser is in to follow up on her GAD and MDD. She has been compliant with her medication and has seen Lovenia Shuck before.    Doing reasonable. Grandmother passed in June. She went thru grief Sleep fair Mood not angry  effexor helps anxiety Modifying factor: girlfirend, job Severity of depression: 8/10  No associated psychotic symptoms or mania.  Principal Problem: GAD and MDD Diagnosis:   Patient Active Problem List   Diagnosis Date Noted  . Gouty arthritis [M10.9] 11/04/2013  . ANA positive [R76.8] 09/22/2013  . Muscle ache [M79.10] 09/22/2013  . Collagen disease ([place]) [M35.9] 09/22/2013  . Elevated erythrocyte sedimentation rate [R70.0] 09/22/2013  . IFG (impaired fasting glucose) [R73.01] 06/24/2012  . Asthmatic bronchitis [J45.909] 10/16/2011  . GAD (generalized anxiety disorder) [F41.1] 07/30/2011  . Essential hypertension [I10] 04/07/2010  . UNSPECIFIED ANEMIA [D64.9] 03/29/2010  . Major depressive disorder, recurrent episode, severe, without mention of psychotic behavior [F33.2] 07/22/2009  . OTHER ABNORMAL GLUCOSE [R73.09] 05/25/2008  . HYPERTRIGLYCERIDEMIA [E78.1] 11/04/2007  . FATIGUE [R53.81, R53.83] 11/04/2007  . EXTERNAL HEMORRHOIDS [K64.4] 07/22/2007  . BACK PAIN [M54.9] 07/22/2007  . TOBACCO ABUSE [F17.200] 07/01/2007  . GERD [K21.9] 07/01/2007  . ABNORMAL THYROID FUNCTION TESTS [R94.6] 04/22/2007   Total Time spent with patient: 25 minutes   Past Medical History:  Past Medical History:  Diagnosis Date  . Abnormal thyroid blood test   . Anemia   . Anxiety   . Depression   . Hypertension   . Tobacco abuse     Past Surgical History:  Procedure Laterality Date  . 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 Cancer Mother 53       Breast  . Colon polyps Mother   . Other Mother        colon polyps  . Dementia Mother   . Anxiety disorder Mother   . Depression Mother   . Paranoid behavior Mother   . Depression Father   . Suicidality Father        commited suicide  . Alcohol abuse Father   . Colon polyps Maternal Aunt        cancerous colon polyps  . Cancer Maternal Uncle        cancerous colon polyps  . Heart disease Other   . Other Other        thyroid disorder  . Alcohol abuse Paternal Uncle   . Dementia Maternal Grandmother   . Alcohol abuse Paternal Grandmother   . Alcohol abuse Paternal Uncle    Social History:  Social History   Substance and Sexual Activity  Alcohol Use No  . Alcohol/week: 0.0 oz     Social History   Substance and Sexual Activity  Drug Use No    Social History   Socioeconomic History  . Marital status: Single    Spouse name: None  . Number of children: None  . Years of education: None  . Highest education level: None  Social Needs  . Financial resource strain: None  . Food insecurity - worry: None  . Food insecurity - inability: None  . Transportation needs - medical: None  . Transportation needs - non-medical: None  Occupational History  .  None  Tobacco Use  . Smoking status: Current Every Day Smoker    Packs/day: 1.00    Years: 27.00    Pack years: 27.00    Types: Cigarettes  . Smokeless tobacco: Never Used  Substance and Sexual Activity  . Alcohol use: No    Alcohol/week: 0.0 oz  . Drug use: No  . Sexual activity: Yes    Partners: Female    Comment: prefers female sex partner  Other Topics Concern  . None  Social History Narrative  . None   Additional History:    Sleep: Fair  Appetite:  Fair   Assessment: GAD stable, MDD stable      Psychiatric Specialty Exam: Physical Exam  Review of Systems  Cardiovascular: Negative for chest pain.  Gastrointestinal: Negative for nausea.  Skin: Negative for rash.   Psychiatric/Behavioral: Negative for depression, hallucinations and suicidal ideas. The patient is not nervous/anxious.     Blood pressure 126/72, pulse 76, resp. rate 16, height 5' 8.5" (1.74 m), weight 248 lb 12.8 oz (112.9 kg), SpO2 97 %.Body mass index is 37.28 kg/m.  General Appearance: Neat and Well Groomed  Engineer, water::  Good  Speech:  Clear and Coherent  Volume:  Normal  Mood:  fair  Affect:  Reactive congruent  Thought Process:  Goal Directed, Intact, Linear and Logical  Orientation:  Full (Time, Place, and Person)  Thought Content:  WDL  Suicidal Thoughts:  No  Homicidal Thoughts:  No  Memory:  Immediate;   Good Recent;   Good Remote;   Good  Judgement:  Good  Insight:  Present  Psychomotor Activity:  Normal  Concentration:  Good  Recall:  Good  Fund of Knowledge:Good  Language: Good  Akathisia:  No  Handed:  Right  AIMS (if indicated):     Assets:  Communication Skills Desire for [place] Talents/Skills Transportation Vocational/Educational  ADL's:  Intact  Cognition: WNL  Sleep:  Fair  Notes she is sleeping during the day.     Current Medications: Current Outpatient Prescriptions  Medication Sig Dispense Refill  . lamoTRIgine (LAMICTAL) 100 MG tablet Take one tablet at day for mood stabilization. 30 tablet 1  . losartan-hydrochlorothiazide (HYZAAR) 100-25 MG per tablet Take 1 tablet by mouth daily. 30 tablet 5  . venlafaxine XR (EFFEXOR-XR) 75 MG 24 hr capsule Take 3 capsules (225 mg total) by mouth daily with breakfast. 90 capsule 0  . VENTOLIN HFA 108 (90 BASE) MCG/ACT inhaler INHALE 4 PUFFS INTO THE LUNGS 2 TIMES DAILY AS NEEDED. 18 Inhaler 3   No current facility-administered medications for this visit.    Lab Results:  No results found for this or any previous visit (from the past 48 hour(s)).  Physical Findings: AIMS: CIWA:   COWS:    Treatment Plan  Summary: Medication management   MDD : improved. Continue effexor and lamicta Mood disorder: not worse. Less anger. Continue lamicta  GAD: less. Continue effexor  Questions addressed FU 4-5 months. Renewed meds.    Merian Capron  9:03 AM 12/06/2016

## 2016-12-23 ENCOUNTER — Other Ambulatory Visit: Payer: Self-pay | Admitting: Family Medicine

## 2017-01-08 ENCOUNTER — Ambulatory Visit: Payer: Self-pay | Admitting: Family Medicine

## 2017-03-15 ENCOUNTER — Ambulatory Visit (INDEPENDENT_AMBULATORY_CARE_PROVIDER_SITE_OTHER): Payer: 59

## 2017-03-15 ENCOUNTER — Ambulatory Visit: Payer: 59 | Admitting: Family Medicine

## 2017-03-15 ENCOUNTER — Encounter: Payer: Self-pay | Admitting: Family Medicine

## 2017-03-15 VITALS — BP 126/73 | HR 77 | Ht 69.0 in | Wt 249.0 lb

## 2017-03-15 DIAGNOSIS — Z1231 Encounter for screening mammogram for malignant neoplasm of breast: Secondary | ICD-10-CM

## 2017-03-15 DIAGNOSIS — L989 Disorder of the skin and subcutaneous tissue, unspecified: Secondary | ICD-10-CM

## 2017-03-15 DIAGNOSIS — L732 Hidradenitis suppurativa: Secondary | ICD-10-CM | POA: Diagnosis not present

## 2017-03-15 MED ORDER — BENZOYL PEROXIDE 5 % EX LIQD: Freq: Two times a day (BID) | CUTANEOUS | 12 refills | Status: DC

## 2017-03-15 MED ORDER — DOXYCYCLINE HYCLATE 100 MG PO TABS: 100.0000 mg | ORAL_TABLET | Freq: Two times a day (BID) | ORAL | 0 refills | Status: DC

## 2017-03-15 NOTE — Progress Notes (Signed)
Subjective:    Patient ID: Brooke Kaiser, female    DOB: 1970-04-21, 47 y.o.   MRN: [id number]  HPI  47 yo female comes in today c/o of boils under her axilla.  She says they have been there for about 4 months.  She did change deodorant about 5 days ago.  Eyes any other changes such as diet, lotions etc.  She has been taking him for oil for about 2 months.    He also wanted to discuss cancer screening.  She recently found out that her mother did have cervical cancer.  He would also like to be referred to dermatology for full skin check as she is never had it done.  She would like to schedule her mammogram as well.  Review of Systems  BP 126/73   Pulse 77   Ht 5\' 9"  (1.753 m)   Wt 249 lb (112.9 kg)   SpO2 98%   BMI 36.77 kg/m     Allergies  Allergen Reactions  . Clavulanic Acid Nausea And Vomiting  . Codeine Nausea And Vomiting  . Sanctura [Trospium] Other (See Comments)    Dizziness    Past Medical History:  Diagnosis Date  . Abnormal thyroid blood test   . Anemia   . Anxiety   . Depression   . Hypertension   . Tobacco abuse     Past Surgical History:  Procedure Laterality Date  . 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 Marital status: Single    Spouse name: Not on file  . Number of children: Not on file  . Years of education: Not on file  . Highest education level: Not on file  Social Needs  . Financial resource strain: Not on file  . Food insecurity - worry: Not on file  . Food insecurity - inability: Not on file  . Transportation needs - medical: Not on file  . Transportation needs - non-medical: Not on file  Occupational History  . Not on file  Tobacco Use  . Smoking status: Current Every Day Smoker    Packs/day: 1.00    Years: 27.00    Pack years: 27.00    Types: Cigarettes  . Smokeless tobacco: Never Used  Substance and Sexual Activity  . Alcohol use: No    Alcohol/week:  0.0 oz  . Drug use: No  . Sexual activity: Yes    Partners: Female    Comment: prefers female sex partner  Other Topics Concern  . Not on file  Social History Narrative  . Not on file    Family History  Problem Relation Age of Onset  . Cancer Mother 7       Breast  . Colon polyps Mother   . Other Mother        colon polyps  . Dementia Mother   . Anxiety disorder Mother   . Depression Mother   . Paranoid behavior Mother   . Depression Father   . Suicidality Father        commited suicide  . Alcohol abuse Father   . Colon polyps Maternal Aunt        cancerous colon polyps  . Cancer Maternal Uncle        cancerous colon polyps  . Heart disease Other   . Other Other        thyroid disorder  . Alcohol abuse Paternal Uncle   .  Dementia Maternal Grandmother   . Alcohol abuse Paternal Grandmother   . Alcohol abuse Paternal Uncle     Outpatient Encounter Medications as of 03/15/2017  Medication Sig  . lamoTRIgine (LAMICTAL) 100 MG tablet TAKE ONE TABLET AT DAY FOR MOOD STABILIZATION.  Marland Kitchen losartan (COZAAR) 100 MG tablet TAKE 1 TABLET BY MOUTH EVERY DAY  . venlafaxine XR (EFFEXOR-XR) 75 MG 24 hr capsule TAKE 3 CAPSULES (225 MG TOTAL) BY MOUTH DAILY WITH BREAKFAST.  Marland Kitchen benzoyl peroxide (BENZOYL PEROXIDE) 5 % external liquid Apply topically 2 (two) times daily.  Marland Kitchen doxycycline (VIBRA-TABS) 100 MG tablet Take 1 tablet (100 mg total) by mouth 2 (two) times daily.  . [DISCONTINUED] albuterol (VENTOLIN HFA) 108 (90 Base) MCG/ACT inhaler INHALE 2-4 PUFFS INTO THE LUNGS 2 TIMES DAILY AS NEEDED.  . [DISCONTINUED] ciprofloxacin (CIPRO) 500 MG tablet Take 1 tablet (500 mg total) by mouth 2 (two) times daily.  . [DISCONTINUED] doxycycline (VIBRA-TABS) 100 MG tablet Take 1 tablet (100 mg total) by mouth 2 (two) times daily.  . [DISCONTINUED] triamcinolone (KENALOG) 0.1 % paste Use as directed 1 application in the mouth or throat 2 (two) times daily.   No facility-administered encounter  medications on file as of 03/15/2017.          Objective:   Physical Exam  Constitutional: She is oriented to person, place, and time. She appears well-developed and well-nourished.  HENT:  Head: Normocephalic and atraumatic.  Eyes: Conjunctivae and EOM are normal.  Cardiovascular: Normal rate.  Pulmonary/Chest: Effort normal.  Neurological: She is alert and oriented to person, place, and time.  Skin: Skin is dry. No pallor.  She has a draining cyst on the right axilla that is tender.  Under her left axilla she has about 3 lesions.  2 are in a healing phase and have a purple discoloration but are not actively draining.  There is a smaller lump in the middle which is nontender with no significant erythema and not currently draining.  Psychiatric: She has a normal mood and affect. Her behavior is normal.  Vitals reviewed.       Assessment & Plan:  Hidradenitis suppurativa -discussed diagnosis.  We discussed the importance of smoking cessation.  Try to find a deodorant that is nonirritating.  She does shave under her arms and making sure cleaning the razor and not using a dull razor.  We also discussed treating acutely since she has some actively draining lesions today with doxycycline.  Explained that this is a more chronic condition and that sometimes the areas have to be excised ultimately.  For now will also try a benzoyl peroxide wash.  Recommend weight loss as well.  If not improving consider low-dose doxycycline daily for 6 months.   We will refer to dermatology for full skin check.  Mammogram screening-order placed for 3D mammogram.  Family history of cervical cancer-her Pap smear is actually up-to-date and was normal and she had negative co-testing.  But I still encouraged her to schedule a physical in a couple of months.

## 2017-05-06 ENCOUNTER — Telehealth (HOSPITAL_COMMUNITY): Payer: Self-pay

## 2017-05-06 ENCOUNTER — Other Ambulatory Visit (HOSPITAL_COMMUNITY): Payer: Self-pay

## 2017-05-06 MED ORDER — VENLAFAXINE HCL ER 75 MG PO CP24: ORAL_CAPSULE | ORAL | 0 refills | Status: DC

## 2017-05-06 NOTE — Telephone Encounter (Signed)
Pharmacy sent a fax requesting a 90 day supply on Venlafaxine ER 75mg . Sent over a 90 day supply per Dr. De Nurse to the pharmacy. Patient has an upcoming appointment this month. Nothing further is needed at this time.

## 2017-05-10 ENCOUNTER — Ambulatory Visit (HOSPITAL_COMMUNITY): Payer: Self-pay | Admitting: Psychiatry

## 2017-06-26 ENCOUNTER — Other Ambulatory Visit: Payer: Self-pay | Admitting: Family Medicine

## 2017-07-03 ENCOUNTER — Other Ambulatory Visit: Payer: Self-pay | Admitting: Family Medicine

## 2017-08-07 ENCOUNTER — Other Ambulatory Visit (HOSPITAL_COMMUNITY): Payer: Self-pay | Admitting: Psychiatry

## 2017-08-11 ENCOUNTER — Other Ambulatory Visit (HOSPITAL_COMMUNITY): Payer: Self-pay | Admitting: Psychiatry

## 2017-09-02 ENCOUNTER — Ambulatory Visit (HOSPITAL_COMMUNITY): Payer: Self-pay | Admitting: Psychiatry

## 2017-09-10 ENCOUNTER — Encounter: Payer: Self-pay | Admitting: Family Medicine

## 2017-09-10 ENCOUNTER — Encounter (HOSPITAL_COMMUNITY): Payer: Self-pay | Admitting: Psychiatry

## 2017-09-10 ENCOUNTER — Ambulatory Visit (INDEPENDENT_AMBULATORY_CARE_PROVIDER_SITE_OTHER): Payer: 59 | Admitting: Family Medicine

## 2017-09-10 ENCOUNTER — Other Ambulatory Visit: Payer: Self-pay

## 2017-09-10 ENCOUNTER — Other Ambulatory Visit (HOSPITAL_COMMUNITY)
Admission: RE | Admit: 2017-09-10 | Discharge: 2017-09-10 | Disposition: A | Discharge status: Home / Self Care | Payer: 59 | Source: Ambulatory Visit | Attending: Family Medicine | Admitting: Family Medicine

## 2017-09-10 ENCOUNTER — Ambulatory Visit (INDEPENDENT_AMBULATORY_CARE_PROVIDER_SITE_OTHER): Payer: 59 | Admitting: Psychiatry

## 2017-09-10 VITALS — BP 135/71 | HR 75 | Ht 69.0 in | Wt 239.0 lb

## 2017-09-10 VITALS — BP 142/82 | HR 92 | Ht 69.0 in

## 2017-09-10 DIAGNOSIS — F411 Generalized anxiety disorder: Secondary | ICD-10-CM

## 2017-09-10 DIAGNOSIS — R454 Irritability and anger: Secondary | ICD-10-CM

## 2017-09-10 DIAGNOSIS — Z0000 Encounter for general adult medical examination without abnormal findings: Secondary | ICD-10-CM | POA: Insufficient documentation

## 2017-09-10 DIAGNOSIS — N907 Vulvar cyst: Secondary | ICD-10-CM

## 2017-09-10 DIAGNOSIS — F331 Major depressive disorder, recurrent, moderate: Secondary | ICD-10-CM

## 2017-09-10 DIAGNOSIS — N930 Postcoital and contact bleeding: Secondary | ICD-10-CM

## 2017-09-10 DIAGNOSIS — N898 Other specified noninflammatory disorders of vagina: Secondary | ICD-10-CM

## 2017-09-10 DIAGNOSIS — R102 Pelvic and perineal pain: Secondary | ICD-10-CM

## 2017-09-10 MED ORDER — LAMOTRIGINE 100 MG PO TABS
ORAL_TABLET | ORAL | 4 refills | Status: DC
Start: 2017-09-10 — End: 2017-12-15

## 2017-09-10 MED ORDER — VENLAFAXINE HCL ER 75 MG PO CP24: ORAL_CAPSULE | ORAL | 0 refills | Status: DC

## 2017-09-10 NOTE — Progress Notes (Signed)
Patient ID: Brooke Kaiser, female   DOB: [birth year], 47 y.o.   MRN: [id number] [facility] MD Progress Note  09/10/2017 9:40 AM Brooke Kaiser  MRN:  [id number] Subjective:  Brooke Kaiser is in to follow up on her GAD and MDD. She has been compliant with her medication and has seen Brooke Kaiser before.   Doing fair mood wise. lamictal helps anger Has poor sleep, endorses seeing images or as if someone is around at times Not taking cbt oil for last 55months  Mood not angry  effexor helps anxiety Modifying factor: girlfriend. job Severity of depression: 7.5/10  No associated psychotic symptoms or mania.  Principal Problem: GAD and MDD Diagnosis:   Patient Active Problem List   Diagnosis Date Noted  . Gouty arthritis [M10.9] 11/04/2013  . ANA positive [R76.8] 09/22/2013  . Muscle ache [M79.10] 09/22/2013  . Collagen disease ([place]) [M35.9] 09/22/2013  . Elevated erythrocyte sedimentation rate [R70.0] 09/22/2013  . IFG (impaired fasting glucose) [R73.01] 06/24/2012  . Asthmatic bronchitis [J45.909] 10/16/2011  . GAD (generalized anxiety disorder) [F41.1] 07/30/2011  . Essential hypertension [I10] 04/07/2010  . UNSPECIFIED ANEMIA [D64.9] 03/29/2010  . Major depressive disorder, recurrent episode, severe, without mention of psychotic behavior [F33.2] 07/22/2009  . OTHER ABNORMAL GLUCOSE [R73.09] 05/25/2008  . HYPERTRIGLYCERIDEMIA [E78.1] 11/04/2007  . FATIGUE [R53.81, R53.83] 11/04/2007  . EXTERNAL HEMORRHOIDS [K64.4] 07/22/2007  . BACK PAIN [M54.9] 07/22/2007  . TOBACCO ABUSE [F17.200] 07/01/2007  . GERD [K21.9] 07/01/2007  . ABNORMAL THYROID FUNCTION TESTS [R94.6] 04/22/2007   Total Time spent with patient: 25 minutes   Past Medical History:  Past Medical History:  Diagnosis Date  . Abnormal thyroid blood test   . Anemia   . Anxiety   . Depression   . Hypertension   . Tobacco abuse     Past Surgical History:  Procedure Laterality Date  . benign tumor removed from her pleural sac on  left lung  9-05   Family History:  Family History  Problem Relation Age of Onset  . Cancer Mother 50       Breast  . Colon polyps Mother   . Other Mother        colon polyps  . Dementia Mother   . Anxiety disorder Mother   . Depression Mother   . Paranoid behavior Mother   . Depression Father   . Suicidality Father        commited suicide  . Alcohol abuse Father   . Colon polyps Maternal Aunt        cancerous colon polyps  . Cancer Maternal Uncle        cancerous colon polyps  . Heart disease Other   . Other Other        thyroid disorder  . Alcohol abuse Paternal Uncle   . Dementia Maternal Grandmother   . Alcohol abuse Paternal Grandmother   . Alcohol abuse Paternal Uncle    Social History:  Social History   Substance and Sexual Activity  Alcohol Use No  . Alcohol/week: 0.3 standard drinks     Social History   Substance and Sexual Activity  Drug Use No    Social History   Socioeconomic History  . Marital status: Single    Spouse name: Not on file  . Number of children: Not on file  . Years of education: Not on file  . Highest education level: Not on file  Occupational History  . Occupation: [employer]    Employer: [employer]  Social Needs  . Financial resource strain: Not on file  . Food insecurity:    Worry: Not on file    Inability: Not on file  . Transportation needs:    Medical: Not on file    Non-medical: Not on file  Tobacco Use  . Smoking status: Current Every Day Smoker    Packs/day: 1.00    Years: 27.00    Pack years: 27.00    Types: Cigarettes  . Smokeless tobacco: Never Used  Substance and Sexual Activity  . Alcohol use: No    Alcohol/week: 0.3 standard drinks  . Drug use: No  . Sexual activity: Yes    Partners: Female    Comment: prefers female sex partner  Lifestyle  . Physical activity:    Days per week: Not on file    Minutes per session: Not on file  . Stress: Not on file  Relationships  . Social connections:     Talks on phone: Not on file    Gets together: Not on file    Attends religious service: Not on file    Active member of club or organization: Not on file    Attends meetings of clubs or organizations: Not on file    Relationship status: Not on file  Other Topics Concern  . Not on file  Social History Narrative  . Not on file   Additional History:    Sleep: Fair  Appetite:  Fair   Assessment: GAD stable, MDD stable      Psychiatric Specialty Exam: Physical Exam  Review of Systems  Cardiovascular: Negative for chest pain.  Gastrointestinal: Negative for nausea.  Skin: Negative for rash.  Psychiatric/Behavioral: Negative for depression, hallucinations and suicidal ideas. The patient has insomnia. The patient is not nervous/anxious.     Last menstrual period 06/24/2013.There is no height or weight on file to calculate BMI.  General Appearance: Neat and Well Groomed  Eye Contact::  Good  Speech:  Clear and Coherent  Volume:  Normal  Mood:  fair  Affect:  congruent  Thought Process:  Goal Directed, Intact, Linear and Logical  Orientation:  Full (Time, Place, and Person)  Thought Content:  WDL  Suicidal Thoughts:  No  Homicidal Thoughts:  No  Memory:  Immediate;   Good Recent;   Good Remote;   Good  Judgement:  Good  Insight:  Present  Psychomotor Activity:  Normal  Concentration:  Good  Recall:  Good  Fund of Knowledge:Good  Language: Good  Akathisia:  No  Handed:  Right  AIMS (if indicated):     Assets:  Communication Skills Desire for [place] Talents/Skills Transportation Vocational/Educational  ADL's:  Intact  Cognition: WNL  Sleep:  Fair  Notes she is sleeping during the day.     Current Medications: Current Outpatient Prescriptions  Medication Sig Dispense Refill  . lamoTRIgine (LAMICTAL) 100 MG tablet Take one tablet at day for mood stabilization. 30 tablet 1  .  losartan-hydrochlorothiazide (HYZAAR) 100-25 MG per tablet Take 1 tablet by mouth daily. 30 tablet 5  . venlafaxine XR (EFFEXOR-XR) 75 MG 24 hr capsule Take 3 capsules (225 mg total) by mouth daily with breakfast. 90 capsule 0  . VENTOLIN HFA 108 (90 BASE) MCG/ACT inhaler INHALE 4 PUFFS INTO THE LUNGS 2 TIMES DAILY AS NEEDED. 18 Inhaler 3   No current facility-administered medications for this visit.    Lab Results:  No results found for this  or any previous visit (from the past 48 hour(s)).  Physical Findings: AIMS: CIWA:   COWS:    Treatment Plan Summary: Medication management   MDD : fair on lamictal and effexor Mood disorder: not worse. Less anger. Continue lamicta  GAD: less. Continue effexor Insomnia: reviewed sleep hygiene. Go to bed late. Avoid coffee or beverages at night Can call for sleep study referral if no improvement   Questions addressed FU 4-5 months. Renewed meds.    Merian Capron  9:40 AM 09/10/2017

## 2017-09-10 NOTE — Progress Notes (Signed)
Subjective:     Brooke Kaiser is a 47 y.o. female and is here for a comprehensive physical exam. The patient reports problems - vaginal sxs. She is concerned about a vaginal cyst and has had pain with intercourse for about 4 months.  She works outdoors and says she tries to drink water but may not get enough.    Social History   Socioeconomic History  . Marital status: Single    Spouse name: Not on file  . Number of children: Not on file  . Years of education: Not on file  . Highest education level: Not on file  Occupational History  . Occupation: [place]    Employer: [employer]  . Financial resource strain: Not on file  . Food insecurity:    Worry: Not on file    Inability: Not on file  . Transportation needs:    Medical: Not on file    Non-medical: Not on file  Tobacco Use  . Smoking status: Current Every Day Smoker    Packs/day: 1.00    Years: 27.00    Pack years: 27.00    Types: Cigarettes  . Smokeless tobacco: Never Used  Substance and Sexual Activity  . Alcohol use: No    Alcohol/week: 0.3 standard drinks  . Drug use: No  . Sexual activity: Yes    Partners: Female    Comment: prefers female sex partner  Lifestyle  . Physical activity:    Days per week: Not on file    Minutes per session: Not on file  . Stress: Not on file  Relationships  . Social connections:    Talks on phone: Not on file    Gets together: Not on file    Attends religious service: Not on file    Active member of club or organization: Not on file    Attends meetings of clubs or organizations: Not on file    Relationship status: Not on file  . Intimate partner violence:    Fear of current or ex partner: Not on file    Emotionally abused: Not on file    Physically abused: Not on file    Forced sexual activity: Not on file  Other Topics Concern  . Not on file  Social History Narrative  . Not on file   Health Maintenance  Topic Date Due  . INFLUENZA VACCINE   11/15/2017 (Originally 08/29/2017)  . TETANUS/TDAP  12/10/2018  . PAP SMEAR  12/19/2020  . HIV Screening  Completed    The following portions of the patient's history were reviewed and updated as appropriate: allergies, current medications, past family history, past medical history, past social history, past surgical history and problem list.  Review of Systems A comprehensive review of systems was negative.   Objective:    BP 135/71   Pulse 75   Ht 5\' 9"  (1.753 m)   Wt 239 lb (108.4 kg)   LMP 06/24/2013   SpO2 98%   BMI 35.29 kg/m  General appearance: alert, cooperative and appears stated age Head: Normocephalic, without obvious abnormality, atraumatic Eyes: conj clear, EOMI, PEERLA Ears: normal TM's and external ear canals both ears Nose: Nares normal. Septum midline. Mucosa normal. No drainage or sinus tenderness. Throat: lips, mucosa, and tongue normal; teeth and gums normal Neck: no adenopathy, no carotid bruit, no JVD, supple, symmetrical, trachea midline and thyroid not enlarged, symmetric, no tenderness/mass/nodules Back: symmetric, no curvature. ROM normal. No CVA tenderness. Lungs: clear  to auscultation bilaterally Breasts: normal appearance, no masses or tenderness Heart: regular rate and rhythm, S1, S2 normal, no murmur, click, rub or gallop Abdomen: soft, non-tender; bowel sounds normal; no masses,  no organomegaly Pelvic: cervix normal in appearance, external genitalia normal, no adnexal masses or tenderness, no cervical motion tenderness, rectovaginal septum normal and uterus feels enlarged.  there is some tissue on the right side of the vaginal canal approx 2 cm with some cracking and bleeding. doesn't look like an ulcer.  She does have a approximately 1 cm cyst on the right labia minora. Extremities: extremities normal, atraumatic, no cyanosis or edema Pulses: 2+ and symmetric Skin: Skin color, texture, turgor normal. No rashes or lesions Lymph nodes: Cervical,  supraclavicular, and axillary nodes normal. Neurologic: Alert and oriented X 3, normal strength and tone. Normal symmetric reflexes. Normal coordination and gait    Assessment:    Healthy female exam.      Plan:     See After Visit Summary for Counseling Recommendations   Keep up a regular exercise program and make sure you are eating a healthy diet Try to eat 4 servings of dairy a day, or if you are lactose intolerant take a calcium with vitamin D daily.  Your vaccines are up to date.   Cysts-either sebaceous or epidermal on the right labia minora-right now it is not bothering her is not tender or painful.  She can continue to monitor if any point it becomes tender or gets larger than recommended it be drained.  She also has some tissue on the right side vaginal canal that is cracking and bleeding.  Is unclear if this is just redundant tissue that is getting some trauma or if it could be an actual lesion.  We did do a swab just to rule out herpes since there was some bleeding and cracking but does not look like a typical ulceration.  We also did a wet prep, GC chlamydia and repeated her Pap smear a little early.  Will refer to GYN for further evaluation of the vaginal lesion.

## 2017-09-11 LAB — COMPLETE METABOLIC PANEL WITH GFR
AG RATIO: 1.8 (calc) (ref 1.0–2.5)
ALKALINE PHOSPHATASE (APISO): 69 U/L (ref 33–115)
ALT: 20 U/L (ref 6–29)
AST: 12 U/L (ref 10–35)
Albumin: 4.6 g/dL (ref 3.6–5.1)
BILIRUBIN TOTAL: 0.5 mg/dL (ref 0.2–1.2)
BUN: 11 mg/dL (ref 7–25)
CHLORIDE: 104 mmol/L (ref 98–110)
CO2: 24 mmol/L (ref 20–32)
Calcium: 9.8 mg/dL (ref 8.6–10.2)
Creat: 0.77 mg/dL (ref 0.50–1.10)
GFR, Est African American: 107 mL/min/{1.73_m2} (ref >=60)
GFR, Est Non African American: 92 mL/min/{1.73_m2} (ref >=60)
GLUCOSE: 104 mg/dL (ref 65–139)
Globulin: 2.6 g/dL (calc) (ref 1.9–3.7)
POTASSIUM: 4.3 mmol/L (ref 3.5–5.3)
Sodium: 138 mmol/L (ref 135–146)
Total Protein: 7.2 g/dL (ref 6.1–8.1)

## 2017-09-11 LAB — CBC
HCT: 45 % (ref 35.0–45.0)
HEMOGLOBIN: 15.6 g/dL — AB (ref 11.7–15.5)
MCH: 30.7 pg (ref 27.0–33.0)
MCHC: 34.7 g/dL (ref 32.0–36.0)
MCV: 88.6 fL (ref 80.0–100.0)
MPV: 9.3 fL (ref 7.5–12.5)
PLATELETS: 300 10*3/uL (ref 140–400)
RBC: 5.08 10*6/uL (ref 3.80–5.10)
RDW: 13.2 % (ref 11.0–15.0)
WBC: 8.6 10*3/uL (ref 3.8–10.8)

## 2017-09-11 LAB — TSH: TSH: 1.26 m[IU]/L

## 2017-09-11 LAB — VITAMIN D 25 HYDROXY (VIT D DEFICIENCY, FRACTURES): VIT D 25 HYDROXY: 26 ng/mL — AB (ref 30–100)

## 2017-09-12 LAB — CYTOLOGY - PAP
Diagnosis: NEGATIVE
HPV (WINDOPATH): NOT DETECTED

## 2017-09-12 LAB — HERPES SIMPLEX VIRUS CULTURE
MICRO NUMBER: 90960002
SPECIMEN QUALITY: ADEQUATE

## 2017-09-12 LAB — WET PREP FOR TRICH, YEAST, CLUE
MICRO NUMBER:: 90960013
SPECIMEN QUALITY 3963: ADEQUATE

## 2017-09-12 LAB — SURESWAB CT/NG/T. VAGINALIS
C. trachomatis RNA, TMA: NOT DETECTED
N. GONORRHOEAE RNA, TMA: NOT DETECTED
Trichomonas vaginalis RNA: NOT DETECTED

## 2017-09-12 MED ORDER — METRONIDAZOLE 500 MG PO TABS: 500.0000 mg | ORAL_TABLET | Freq: Two times a day (BID) | ORAL | 0 refills | Status: DC

## 2017-09-12 NOTE — Addendum Note (Signed)
Addended by: [name] D on: 09/12/2017 07:27 AM   Modules accepted: Orders

## 2017-09-13 ENCOUNTER — Other Ambulatory Visit: Payer: Self-pay | Admitting: *Deleted

## 2017-09-13 DIAGNOSIS — N898 Other specified noninflammatory disorders of vagina: Secondary | ICD-10-CM

## 2017-09-24 ENCOUNTER — Ambulatory Visit (INDEPENDENT_AMBULATORY_CARE_PROVIDER_SITE_OTHER): Payer: 59

## 2017-09-24 DIAGNOSIS — D259 Leiomyoma of uterus, unspecified: Secondary | ICD-10-CM

## 2017-09-24 DIAGNOSIS — N83292 Other ovarian cyst, left side: Secondary | ICD-10-CM | POA: Diagnosis not present

## 2017-09-24 DIAGNOSIS — N83291 Other ovarian cyst, right side: Secondary | ICD-10-CM

## 2017-09-24 DIAGNOSIS — R102 Pelvic and perineal pain: Secondary | ICD-10-CM

## 2017-09-24 DIAGNOSIS — Z0000 Encounter for general adult medical examination without abnormal findings: Secondary | ICD-10-CM | POA: Diagnosis not present

## 2017-09-24 DIAGNOSIS — N930 Postcoital and contact bleeding: Secondary | ICD-10-CM

## 2017-09-24 LAB — LIPID PANEL
Cholesterol: 170 mg/dL (ref <200)
HDL: 33 mg/dL — ABNORMAL LOW (ref >50)
LDL CHOLESTEROL (CALC): 102 mg/dL — AB
Non-HDL Cholesterol (Calc): 137 mg/dL (calc) — ABNORMAL HIGH (ref <130)
TRIGLYCERIDES: 229 mg/dL — AB (ref <150)
Total CHOL/HDL Ratio: 5.2 (calc) — ABNORMAL HIGH (ref <5.0)

## 2017-12-14 ENCOUNTER — Other Ambulatory Visit (HOSPITAL_COMMUNITY): Payer: Self-pay | Admitting: Psychiatry

## 2017-12-15 ENCOUNTER — Other Ambulatory Visit (HOSPITAL_COMMUNITY): Payer: Self-pay | Admitting: Psychiatry

## 2018-01-14 ENCOUNTER — Ambulatory Visit (HOSPITAL_COMMUNITY): Payer: Self-pay | Admitting: Psychiatry

## 2018-02-11 ENCOUNTER — Ambulatory Visit (INDEPENDENT_AMBULATORY_CARE_PROVIDER_SITE_OTHER): Payer: 59 | Admitting: Psychiatry

## 2018-02-11 ENCOUNTER — Encounter (HOSPITAL_COMMUNITY): Payer: Self-pay | Admitting: Psychiatry

## 2018-02-11 DIAGNOSIS — R454 Irritability and anger: Secondary | ICD-10-CM | POA: Diagnosis not present

## 2018-02-11 DIAGNOSIS — F331 Major depressive disorder, recurrent, moderate: Secondary | ICD-10-CM | POA: Diagnosis not present

## 2018-02-11 DIAGNOSIS — F411 Generalized anxiety disorder: Secondary | ICD-10-CM

## 2018-02-11 MED ORDER — LAMOTRIGINE 100 MG PO TABS: ORAL_TABLET | ORAL | 0 refills | Status: DC

## 2018-02-11 MED ORDER — VENLAFAXINE HCL ER 75 MG PO CP24: ORAL_CAPSULE | ORAL | 0 refills | Status: DC

## 2018-02-11 NOTE — Progress Notes (Signed)
Patient ID: Brooke Kaiser, female   DOB: 12-Sep-1970, 48 y.o.   MRN: [id number] [facility] MD Progress Note  02/11/2018 9:22 AM [name]  MRN:  [id number] Subjective:  Brooke Kaiser is in to follow up on her GAD and MDD. She has been compliant with her medication and has seen Brooke Kaiser before.   Doing fair on lamictal. No rash Relationship issues, partner is much younger But tolearnce is ok .less moody Worries about mom not wants to get help, she supports her effexor helps anxiety Modifying factor: girlfriend. job Severity of depression: 7.5/10  No associated psychotic symptoms or mania.  Principal Problem: GAD and MDD Diagnosis:   Patient Active Problem List   Diagnosis Date Noted  . Gouty arthritis [M10.9] 11/04/2013  . ANA positive [R76.8] 09/22/2013  . Muscle ache [M79.10] 09/22/2013  . Collagen disease ([place]) [M35.9] 09/22/2013  . Elevated erythrocyte sedimentation rate [R70.0] 09/22/2013  . IFG (impaired fasting glucose) [R73.01] 06/24/2012  . Asthmatic bronchitis [J45.909] 10/16/2011  . GAD (generalized anxiety disorder) [F41.1] 07/30/2011  . Essential hypertension [I10] 04/07/2010  . UNSPECIFIED ANEMIA [D64.9] 03/29/2010  . Major depressive disorder, recurrent episode, severe, without mention of psychotic behavior [F33.2] 07/22/2009  . OTHER ABNORMAL GLUCOSE [R73.09] 05/25/2008  . HYPERTRIGLYCERIDEMIA [E78.1] 11/04/2007  . FATIGUE [R53.81, R53.83] 11/04/2007  . EXTERNAL HEMORRHOIDS [K64.4] 07/22/2007  . BACK PAIN [M54.9] 07/22/2007  . TOBACCO ABUSE [F17.200] 07/01/2007  . GERD [K21.9] 07/01/2007  . ABNORMAL THYROID FUNCTION TESTS [R94.6] 04/22/2007   Total Time spent with patient: 25 minutes   Past Medical History:  Past Medical History:  Diagnosis Date  . Abnormal thyroid blood test   . Anemia   . Anxiety   . Depression   . Hypertension   . Tobacco abuse     Past Surgical History:  Procedure Laterality Date  . 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 Cancer Mother 82       Breast  . Colon polyps Mother   . Other Mother        colon polyps  . Dementia Mother   . Anxiety disorder Mother   . Depression Mother   . Paranoid behavior Mother   . Depression Father   . Suicidality Father        commited suicide  . Alcohol abuse Father   . Colon polyps Maternal Aunt        cancerous colon polyps  . Cancer Maternal Uncle        cancerous colon polyps  . Heart disease Other   . Other Other        thyroid disorder  . Alcohol abuse Paternal Uncle   . Dementia Maternal Grandmother   . Alcohol abuse Paternal Grandmother   . Alcohol abuse Paternal Uncle    Social History:  Social History   Substance and Sexual Activity  Alcohol Use No  . Alcohol/week: 0.3 standard drinks     Social History   Substance and Sexual Activity  Drug Use No    Social History   Socioeconomic History  . Marital status: Single    Spouse name: Not on file  . Number of children: Not on file  . Years of education: Not on file  . Highest education level: Not on file  Occupational History  . Occupation: [place]    Employer: [employer]  .  Financial resource strain: Not on file  . Food insecurity:    Worry: Not on file    Inability: Not on file  . Transportation needs:    Medical: Not on file    Non-medical: Not on file  Tobacco Use  . Smoking status: Current Every Day Smoker    Packs/day: 1.00    Years: 27.00    Pack years: 27.00    Types: Cigarettes  . Smokeless tobacco: Never Used  Substance and Sexual Activity  . Alcohol use: No    Alcohol/week: 0.3 standard drinks  . Drug use: No  . Sexual activity: Yes    Partners: Female    Comment: prefers female sex partner  Lifestyle  . Physical activity:    Days per week: Not on file    Minutes per session: Not on file  . Stress: Not on file  Relationships  . Social connections:     Talks on phone: Not on file    Gets together: Not on file    Attends religious service: Not on file    Active member of club or organization: Not on file    Attends meetings of clubs or organizations: Not on file    Relationship status: Not on file  Other Topics Concern  . Not on file  Social History Narrative  . Not on file   Additional History:    Sleep: Fair  Appetite:  Fair   Assessment: GAD stable, MDD stable      Psychiatric Specialty Exam: Physical Exam  Review of Systems  Cardiovascular: Negative for palpitations.  Gastrointestinal: Negative for nausea.  Skin: Negative for rash.  Psychiatric/Behavioral: Negative for depression, hallucinations and suicidal ideas. The patient is not nervous/anxious.     Last menstrual period 06/24/2013.There is no height or weight on file to calculate BMI.  General Appearance: Neat and Well Groomed  Eye Contact::  Good  Speech:  Clear and Coherent  Volume:  Normal  Mood:  fair  Affect:  congruent  Thought Process:  Goal Directed, Intact, Linear and Logical  Orientation:  Full (Time, Place, and Person)  Thought Content:  WDL  Suicidal Thoughts:  No  Homicidal Thoughts:  No  Memory:  Immediate;   Good Recent;   Good Remote;   Good  Judgement:  Good  Insight:  Present  Psychomotor Activity:  Normal  Concentration:  Good  Recall:  Good  Fund of Knowledge:Good  Language: Good  Akathisia:  No  Handed:  Right  AIMS (if indicated):     Assets:  Communication Skills Desire for [place] Talents/Skills Transportation Vocational/Educational  ADL's:  Intact  Cognition: WNL  Sleep:  Fair  Notes she is sleeping during the day.     Current Medications: Current Outpatient Prescriptions  Medication Sig Dispense Refill  . lamoTRIgine (LAMICTAL) 100 MG tablet Take one tablet at day for mood stabilization. 30 tablet 1  .  losartan-hydrochlorothiazide (HYZAAR) 100-25 MG per tablet Take 1 tablet by mouth daily. 30 tablet 5  . venlafaxine XR (EFFEXOR-XR) 75 MG 24 hr capsule Take 3 capsules (225 mg total) by mouth daily with breakfast. 90 capsule 0  . VENTOLIN HFA 108 (90 BASE) MCG/ACT inhaler INHALE 4 PUFFS INTO THE LUNGS 2 TIMES DAILY AS NEEDED. 18 Inhaler 3   No current facility-administered medications for this visit.    Lab Results:  No results found for this or any previous visit (from the past 48 hour(s)).  Physical Findings: AIMS: CIWA:   COWS:    Treatment Plan Summary: Medication management   MDD : fair. Continue lamictal. Advise to encourage mom to get hellp Mood disorder: not worse. Less anger. Continue lamicta  GAD: less. Continue effexor    Questions addressed FU 4-5 months. Renewed meds.    Javionna Leder  9:22 AM 02/11/2018

## 2018-03-04 ENCOUNTER — Ambulatory Visit (INDEPENDENT_AMBULATORY_CARE_PROVIDER_SITE_OTHER): Payer: 59 | Admitting: Family Medicine

## 2018-03-04 ENCOUNTER — Encounter: Payer: Self-pay | Admitting: Family Medicine

## 2018-03-04 VITALS — BP 115/77 | HR 76 | Ht 69.0 in | Wt 241.0 lb

## 2018-03-04 DIAGNOSIS — R42 Dizziness and giddiness: Secondary | ICD-10-CM

## 2018-03-04 DIAGNOSIS — Z720 Tobacco use: Secondary | ICD-10-CM | POA: Diagnosis not present

## 2018-03-04 DIAGNOSIS — E559 Vitamin D deficiency, unspecified: Secondary | ICD-10-CM | POA: Diagnosis not present

## 2018-03-04 DIAGNOSIS — D582 Other hemoglobinopathies: Secondary | ICD-10-CM

## 2018-03-04 DIAGNOSIS — R5383 Other fatigue: Secondary | ICD-10-CM

## 2018-03-04 DIAGNOSIS — J4540 Moderate persistent asthma, uncomplicated: Secondary | ICD-10-CM

## 2018-03-04 DIAGNOSIS — K1370 Unspecified lesions of oral mucosa: Secondary | ICD-10-CM

## 2018-03-04 MED ORDER — FLUTICASONE PROPIONATE HFA 110 MCG/ACT IN AERO: 2.0000 | INHALATION_SPRAY | Freq: Two times a day (BID) | RESPIRATORY_TRACT | 2 refills | Status: DC

## 2018-03-04 NOTE — Progress Notes (Signed)
Subjective:    CC: C/O Fatigue  HPI:  The 48 year old female is here today complaining of fatigue.  x 2-3 wks she reports that she has not had any changes in her work schedule. over the past wk she stated that she has had some issues with sleeping.  She was diagnosed with vitamin D deficiency back in August and says she did take vitamin D for maybe a month but then count fell off with it.  She does continue to smoke as well and she would like to discuss that today.  Tobacco abuse-she would like to discuss smoking cessation.  She has been smoking since she was 30 says she has been smoking for over 30 years at this point.  She typically smokes a carton a week.  She did try Chantix in the past but says it made her very nauseated and had some vivid dreams.  She has tried to just quit on her own and she is also tried cutting back.  More recently she is been limiting how any packs of cigarettes she buys per week in an attempt to cut back.  She also reports a mouth x 1wk she states that the dentist noticed this on the inside of her bottom lip, she doesn't recall this ever being there until now. she denies pain or any other problems she said that the dentist was going to refer her to an Chief Financial Officer however she has not heard anything.    She also wants to discuss her chronic dizziness.  She says she always feels just a little bit off but sometimes it just increases in intensity.  She has been diagnosed with vertigo in the past.  She says sometimes even with driving short notice it she will have to pull off the road and stop and then it just passes pretty quickly and she is able to drive again.  She does notice it a little bit more with position change.  Is also had some allergies and sinus congestion more recently and wonders if that actually could be contributing as well.  Over the last 2 weeks her asthma has really increased and kicked up.  She has been using her albuterol at least once a day and  occasionally twice a day.  She has been hearing some extra congestion and wheezing in her chest.  No fevers or chills.  Past medical history, Surgical history, Family history not pertinant except as noted below, Social history, Allergies, and medications have been entered into the medical record, reviewed, and corrections made.   Review of Systems: No fevers, chills, night sweats, weight loss, chest pain, or shortness of breath.   Objective:    General: Well Developed, well nourished, and in no acute distress.  Neuro: Alert and oriented x3, extra-ocular muscles intact, sensation grossly intact.  She did have some horizontal nystagmus with Dix-Hallpike maneuver to the left but no significant increase in her reported symptoms with the maneuver. HEENT: Normocephalic, atraumatic, oropharynx is clear.  M's and canals are clear bilaterally.  On the inner lower lip she has a round papular rough textured surfaced pink lesion approximately 6 mm in size. Skin: Warm and dry, no rashes. Cardiac: Regular rate and rhythm, no murmurs rubs or gallops, no lower extremity edema.  Respiratory: Clear to auscultation bilaterally. Not using accessory muscles, speaking in full sentences.   Impression and Recommendations:    Asthma, moderate persistant -we discussed putting her on a controller.  We will start Flovent.  Over the  next couple of weeks she should hopefully decrease her use of albuterol if not please let us know otherwise we will continue Flovent for at least a couple months and then wean if she is tolerating well.  Fatigue -we will recheck her vitamin D level as well as a CBC she also had elevated hemoglobin previously likely from her tobacco use.  It sounds like her asthma has been flaring and she has not been sleeping well which is the most likely contributor.  Dizziness -interestingly on her Dix-Hallpike maneuver she had some horizontal nystagmus on the left side but says she did not really feel more  dizzy with the right versus the left as she feels like her dizziness is just chronic.  Want her to try the home exercises for the next 2 weeks and see if her symptoms improve.  If not then she can let us know.  Also had some sinus congestion and sneezing and allergies so consider that this could be also increasing her symptoms.  Tob abuse - discussed options.  discussed maybe trying chantix low dose since had nausea with it previously.    Lesion on inner lip -lesion looks most consistent with a wart.  Explained that this typically caused by virus.  Normally we would treat these with an acid or cryotherapy but that would not be very amenable to the inside of the mouth for that type of treatment.  Will recommend referral to oral surgeon just that they can also look at and evaluate if they think it might need to be biopsied or they also feel it might be consistent with a viral wart.

## 2018-03-05 ENCOUNTER — Other Ambulatory Visit: Payer: Self-pay | Admitting: Family Medicine

## 2018-03-05 DIAGNOSIS — E559 Vitamin D deficiency, unspecified: Secondary | ICD-10-CM

## 2018-03-05 LAB — BASIC METABOLIC PANEL WITH GFR
BUN: 11 mg/dL (ref 7–25)
CHLORIDE: 104 mmol/L (ref 98–110)
CO2: 28 mmol/L (ref 20–32)
Calcium: 9.7 mg/dL (ref 8.6–10.2)
Creat: 0.79 mg/dL (ref 0.50–1.10)
GFR, EST AFRICAN AMERICAN: 103 mL/min/{1.73_m2} (ref >=60)
GFR, Est Non African American: 89 mL/min/{1.73_m2} (ref >=60)
Glucose, Bld: 105 mg/dL — ABNORMAL HIGH (ref 65–99)
Potassium: 4.6 mmol/L (ref 3.5–5.3)
Sodium: 140 mmol/L (ref 135–146)

## 2018-03-05 LAB — CBC WITH DIFFERENTIAL/PLATELET
ABSOLUTE MONOCYTES: 670 {cells}/uL (ref 200–950)
BASOS ABS: 52 {cells}/uL (ref 0–200)
Basophils Relative: 0.6 %
EOS ABS: 252 {cells}/uL (ref 15–500)
Eosinophils Relative: 2.9 %
HEMATOCRIT: 42.4 % (ref 35.0–45.0)
Hemoglobin: 15.1 g/dL (ref 11.7–15.5)
LYMPHS ABS: 3358 {cells}/uL (ref 850–3900)
MCH: 31.2 pg (ref 27.0–33.0)
MCHC: 35.6 g/dL (ref 32.0–36.0)
MCV: 87.6 fL (ref 80.0–100.0)
MPV: 9.5 fL (ref 7.5–12.5)
Monocytes Relative: 7.7 %
NEUTROS PCT: 50.2 %
Neutro Abs: 4367 cells/uL (ref 1500–7800)
Platelets: 274 10*3/uL (ref 140–400)
RBC: 4.84 10*6/uL (ref 3.80–5.10)
RDW: 13.1 % (ref 11.0–15.0)
Total Lymphocyte: 38.6 %
WBC: 8.7 10*3/uL (ref 3.8–10.8)

## 2018-03-05 LAB — VITAMIN D 25 HYDROXY (VIT D DEFICIENCY, FRACTURES): Vit D, 25-Hydroxy: 19 ng/mL — ABNORMAL LOW (ref 30–100)

## 2018-03-05 NOTE — Progress Notes (Signed)
Per labs:  "Hi Brooke Kaiser, your metabolic panel overall looks okay. Your blood count is normal. No sign of anemia. Vitamin D is low in fact it is even lower than it was in August. Recommend starting in the over-the-counter 2000 IU daily. Plan to recheck level in 8 weeks"  Vit D ordered and faxed

## 2018-03-23 ENCOUNTER — Other Ambulatory Visit: Payer: Self-pay | Admitting: Family Medicine

## 2018-04-22 ENCOUNTER — Other Ambulatory Visit: Payer: Self-pay

## 2018-04-22 ENCOUNTER — Telehealth (INDEPENDENT_AMBULATORY_CARE_PROVIDER_SITE_OTHER): Payer: 59 | Admitting: Family Medicine

## 2018-04-22 VITALS — Wt 235.0 lb

## 2018-04-22 DIAGNOSIS — J018 Other acute sinusitis: Secondary | ICD-10-CM

## 2018-04-22 MED ORDER — MOMETASONE FUROATE 100 MCG/ACT IN AERO: 2.0000 | INHALATION_SPRAY | Freq: Two times a day (BID) | RESPIRATORY_TRACT | 1 refills | Status: DC

## 2018-04-22 MED ORDER — DOXYCYCLINE HYCLATE 100 MG PO TABS: 100.0000 mg | ORAL_TABLET | Freq: Two times a day (BID) | ORAL | 0 refills | Status: DC

## 2018-04-22 NOTE — Progress Notes (Signed)
Virtual Visit via Telephone Note  I connected with Brooke Kaiser on 04/22/18 at  8:50 AM EDT by telephone and verified that I am speaking with the correct person using two identifiers.   I discussed the limitations, risks, security and privacy concerns of performing an evaluation and management service by telephone and the availability of in person appointments. I also discussed with the patient that there may be a patient responsible charge related to this service. The patient expressed understanding and agreed to proceed.    History of Present Illness:  Cough x 8 weeks. Started with bronchitis and says never really got worse.  Had a low grade fever for about  2 days over weekend.  No facial pain.  C/O nasal congestion that is severe.  Some congestion in the chest but is mild.  Using her albuterol about daily.  + wheezing and SOB.  Fever seems to have resolved.  Has had some nausea this weekend and diarrhea.  No blood in the sputum. Couldn't afford the floven.     Observations/Objective: Patient is able to speak in complete sentences over the phone without any shortness of breath.  Assessment and Plan: Acute sinusitis -treat with doxycycline.  If new symptoms develop or if she worsens or does not improve then please give [place] call back by the end of the week or early next week.  Acute bronchitis-asthma exacerbation-using albuterol daily.  Was unable to afford the Flovent so we will see if we could find another substitution for daily prophylactic.  Follow Up Instructions:   I discussed the assessment and treatment plan with the patient. The patient was provided an opportunity to ask questions and all were answered. The patient agreed with the plan and demonstrated an understanding of the instructions.   The patient was advised to call back or seek an in-person evaluation if the symptoms worsen or if the condition fails to improve as anticipated.  I provided 11 minutes of non-face-to-face time  during this encounter.   [name], MD

## 2018-06-17 ENCOUNTER — Ambulatory Visit (HOSPITAL_COMMUNITY): Payer: 59 | Admitting: Psychiatry

## 2018-06-17 ENCOUNTER — Encounter (HOSPITAL_COMMUNITY): Payer: Self-pay | Admitting: Psychiatry

## 2018-06-17 ENCOUNTER — Ambulatory Visit (INDEPENDENT_AMBULATORY_CARE_PROVIDER_SITE_OTHER): Payer: 59 | Admitting: Psychiatry

## 2018-06-17 DIAGNOSIS — F411 Generalized anxiety disorder: Secondary | ICD-10-CM | POA: Diagnosis not present

## 2018-06-17 DIAGNOSIS — F331 Major depressive disorder, recurrent, moderate: Secondary | ICD-10-CM | POA: Diagnosis not present

## 2018-06-17 MED ORDER — VENLAFAXINE HCL ER 75 MG PO CP24: ORAL_CAPSULE | ORAL | 0 refills | Status: DC

## 2018-06-17 MED ORDER — LAMOTRIGINE 100 MG PO TABS: ORAL_TABLET | ORAL | 0 refills | Status: DC

## 2018-06-17 NOTE — Progress Notes (Signed)
Patient ID: Brooke Kaiser, female   DOB: 01-27-1971, 48 y.o.   MRN: [id number] [facility] MD Progress Note  06/17/2018 10:25 AM Brooke Kaiser  MRN:  [id number]   I connected with Marquis Lunch on 06/17/18 at 10:15 AM EDT by a video enabled telemedicine application and verified that I am speaking with the correct person using two identifiers.   I discussed the limitations of evaluation and management by telemedicine and the availability of in person appointments. The patient expressed understanding and agreed to proceed.  Doing fair on lamictal. Less angry, still working while in pandemic  Relationship issues, partner is much younger bu tdoing fair  Worries about mom not wants to get help, she supports her effexor helps anxiety Modifying factor: girlfriend. Job  Severity of depression: 7.5/10  No associated psychotic symptoms or mania.  Principal Problem: GAD and MDD Diagnosis:   Patient Active Problem List   Diagnosis Date Noted  . Elevated hemoglobin ([place]) [D58.2] 03/04/2018  . Vitamin D deficiency [E55.9] 03/04/2018  . Gouty arthritis [M10.9] 11/04/2013  . ANA positive [R76.8] 09/22/2013  . Muscle ache [M79.10] 09/22/2013  . Collagen disease ([place]) [M35.9] 09/22/2013  . Elevated erythrocyte sedimentation rate [R70.0] 09/22/2013  . IFG (impaired fasting glucose) [R73.01] 06/24/2012  . Asthmatic bronchitis [J45.909] 10/16/2011  . GAD (generalized anxiety disorder) [F41.1] 07/30/2011  . Essential hypertension [I10] 04/07/2010  . UNSPECIFIED ANEMIA [D64.9] 03/29/2010  . Major depressive disorder, recurrent episode, severe, without mention of psychotic behavior [F33.2] 07/22/2009  . OTHER ABNORMAL GLUCOSE [R73.09] 05/25/2008  . HYPERTRIGLYCERIDEMIA [E78.1] 11/04/2007  . FATIGUE [R53.81, R53.83] 11/04/2007  . EXTERNAL HEMORRHOIDS [K64.4] 07/22/2007  . BACK PAIN [M54.9] 07/22/2007  . TOBACCO ABUSE [F17.200] 07/01/2007  . GERD [K21.9] 07/01/2007  . ABNORMAL THYROID FUNCTION TESTS  [R94.6] 04/22/2007   Total Time spent with patient: 25 minutes   Past Medical History:  Past Medical History:  Diagnosis Date  . Abnormal thyroid blood test   . Anemia   . Anxiety   . Depression   . Hypertension   . Tobacco abuse     Past Surgical History:  Procedure Laterality Date  . 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 Cancer Mother 60       Breast  . Colon polyps Mother   . Other Mother        colon polyps  . Dementia Mother   . Anxiety disorder Mother   . Depression Mother   . Paranoid behavior Mother   . Depression Father   . Suicidality Father        commited suicide  . Alcohol abuse Father   . Colon polyps Maternal Aunt        cancerous colon polyps  . Cancer Maternal Uncle        cancerous colon polyps  . Heart disease Other   . Other Other        thyroid disorder  . Alcohol abuse Paternal Uncle   . Dementia Maternal Grandmother   . Alcohol abuse Paternal Grandmother   . Alcohol abuse Paternal Uncle    Social History:  Social History   Substance and Sexual Activity  Alcohol Use No  . Alcohol/week: 0.3 standard drinks     Social History   Substance and Sexual Activity  Drug Use No    Social History   Socioeconomic History  . Marital status:  Single    Spouse name: Not on file  . Number of children: Not on file  . Years of education: Not on file  . Highest education level: Not on file  Occupational History  . Occupation: [place]    Employer: [employer]  . Financial resource strain: Not on file  . Food insecurity:    Worry: Not on file    Inability: Not on file  . Transportation needs:    Medical: Not on file    Non-medical: Not on file  Tobacco Use  . Smoking status: Current Every Day Smoker    Packs/day: 1.00    Years: 27.00    Pack years: 27.00    Types: Cigarettes  . Smokeless tobacco: Never Used  Substance  and Sexual Activity  . Alcohol use: No    Alcohol/week: 0.3 standard drinks  . Drug use: No  . Sexual activity: Yes    Partners: Female    Comment: prefers female sex partner  Lifestyle  . Physical activity:    Days per week: Not on file    Minutes per session: Not on file  . Stress: Not on file  Relationships  . Social connections:    Talks on phone: Not on file    Gets together: Not on file    Attends religious service: Not on file    Active member of club or organization: Not on file    Attends meetings of clubs or organizations: Not on file    Relationship status: Not on file  Other Topics Concern  . Not on file  Social History Narrative  . Not on file   Additional History:    Sleep: Fair  Appetite:  Fair   Assessment: GAD stable, MDD stable      Psychiatric Specialty Exam: Physical Exam  Review of Systems  Cardiovascular: Negative for chest pain.  Skin: Negative for rash.  Psychiatric/Behavioral: Negative for depression, hallucinations and suicidal ideas. The patient is not nervous/anxious.     Last menstrual period 06/24/2013.There is no height or weight on file to calculate BMI.  General Appearance: Neat and Well Groomed  Eye Contact::  Good  Speech:  Clear and Coherent  Volume:  Normal  Mood:  fair  Affect:  congruent  Thought Process:  Goal Directed, Intact, Linear and Logical  Orientation:  Full (Time, Place, and Person)  Thought Content:  WDL  Suicidal Thoughts:  No  Homicidal Thoughts:  No  Memory:  Immediate;   Good Recent;   Good Remote;   Good  Judgement:  Good  Insight:  Present  Psychomotor Activity:  Normal  Concentration:  Good  Recall:  Good  Fund of Knowledge:Good  Language: Good  Akathisia:  No  Handed:  Right  AIMS (if indicated):     Assets:  Communication Skills Desire for [place] Talents/Skills Transportation Vocational/Educational   ADL's:  Intact  Cognition: WNL  Sleep:  Fair  Notes she is sleeping during the day.     Current Medications: Current Outpatient Prescriptions  Medication Sig Dispense Refill  . lamoTRIgine (LAMICTAL) 100 MG tablet Take one tablet at day for mood stabilization. 30 tablet 1  . losartan-hydrochlorothiazide (HYZAAR) 100-25 MG per tablet Take 1 tablet by mouth daily. 30 tablet 5  . venlafaxine XR (EFFEXOR-XR) 75 MG 24 hr capsule Take 3 capsules (225 mg total) by mouth daily with breakfast. 90 capsule 0  . VENTOLIN  HFA 108 (90 BASE) MCG/ACT inhaler INHALE 4 PUFFS INTO THE LUNGS 2 TIMES DAILY AS NEEDED. 18 Inhaler 3   No current facility-administered medications for this visit.    Lab Results:  No results found for this or any previous visit (from the past 48 hour(s)).  Physical Findings: AIMS: CIWA:   COWS:    Treatment Plan Summary: Medication management   MDD : fair, continue lamictal and effexor Mood disorder: not worse. Less anger. Continue lamictal  GAD: fair. Continue coping skills and effexor meds sent I discussed the assessment and treatment plan with the patient. The patient was provided an opportunity to ask questions and all were answered. The patient agreed with the plan and demonstrated an understanding of the instructions.   The patient was advised to call back or seek an in-person evaluation if the symptoms worsen or if the condition fails to improve as anticipated.  I provided 15  minutes of non-face-to-face time during this encounter. Questions addressed FU 4-5 months. Renewed meds.    Merian Capron  10:25 AM 06/17/2018

## 2018-07-15 ENCOUNTER — Ambulatory Visit: Payer: Self-pay | Admitting: Family Medicine

## 2018-07-22 ENCOUNTER — Ambulatory Visit: Payer: Self-pay | Admitting: Family Medicine

## 2018-07-28 ENCOUNTER — Telehealth: Payer: Self-pay | Admitting: Family Medicine

## 2018-07-28 NOTE — Telephone Encounter (Signed)
Patient would like to know if you would take her great aunt as a new patient. Please advise.

## 2018-07-28 NOTE — Telephone Encounter (Signed)
Yes, I would be happy to see her.

## 2018-10-16 ENCOUNTER — Ambulatory Visit (HOSPITAL_COMMUNITY): Payer: 59 | Admitting: Psychiatry

## 2018-10-16 ENCOUNTER — Other Ambulatory Visit: Payer: Self-pay

## 2018-10-17 ENCOUNTER — Encounter (HOSPITAL_COMMUNITY): Payer: Self-pay | Admitting: Psychiatry

## 2018-10-17 ENCOUNTER — Ambulatory Visit (INDEPENDENT_AMBULATORY_CARE_PROVIDER_SITE_OTHER): Payer: 59 | Admitting: Psychiatry

## 2018-10-17 ENCOUNTER — Other Ambulatory Visit: Payer: Self-pay

## 2018-10-17 DIAGNOSIS — F411 Generalized anxiety disorder: Secondary | ICD-10-CM | POA: Diagnosis not present

## 2018-10-17 DIAGNOSIS — R454 Irritability and anger: Secondary | ICD-10-CM

## 2018-10-17 DIAGNOSIS — F331 Major depressive disorder, recurrent, moderate: Secondary | ICD-10-CM

## 2018-10-17 MED ORDER — VENLAFAXINE HCL ER 75 MG PO CP24: ORAL_CAPSULE | ORAL | 0 refills | Status: DC

## 2018-10-17 MED ORDER — LAMOTRIGINE 100 MG PO TABS: ORAL_TABLET | ORAL | 0 refills | Status: DC

## 2018-10-17 NOTE — Progress Notes (Signed)
Patient ID: Brooke Kaiser, female   DOB: [birth year], 48 y.o.   MRN: [id number] [facility] MD Progress Note  10/17/2018 10:50 AM LESHANDA CROUSE  MRN:  [id number]   Virtual Visit via Video Note  I connected with Marquis Lunch on 10/17/18 at 10:45 AM EDT by a video enabled telemedicine application and verified that I am speaking with the correct person using two identifiers.   I discussed the limitations of evaluation and management by telemedicine and the availability of in person appointments. The patient expressed understanding and agreed to proceed.  Doing fair on lamictal Job is fine  Relationship issues, partner communication is better now Trying to work on weight loss and healthy habits Supports mom effexor helps anxiety Modifying factor: girlfriend. Job  Severity of depression: 7.5/10  No associated psychotic symptoms or mania.  Principal Problem: GAD and MDD Diagnosis:   Patient Active Problem List   Diagnosis Date Noted  . Elevated hemoglobin ([place]) [D58.2] 03/04/2018  . Vitamin D deficiency [E55.9] 03/04/2018  . Gouty arthritis [M10.9] 11/04/2013  . ANA positive [R76.8] 09/22/2013  . Muscle ache [M79.10] 09/22/2013  . Collagen disease ([place]) [M35.9] 09/22/2013  . Elevated erythrocyte sedimentation rate [R70.0] 09/22/2013  . IFG (impaired fasting glucose) [R73.01] 06/24/2012  . Asthmatic bronchitis [J45.909] 10/16/2011  . GAD (generalized anxiety disorder) [F41.1] 07/30/2011  . Essential hypertension [I10] 04/07/2010  . UNSPECIFIED ANEMIA [D64.9] 03/29/2010  . Major depressive disorder, recurrent episode, severe, without mention of psychotic behavior [F33.2] 07/22/2009  . OTHER ABNORMAL GLUCOSE [R73.09] 05/25/2008  . HYPERTRIGLYCERIDEMIA [E78.1] 11/04/2007  . FATIGUE [R53.81, R53.83] 11/04/2007  . EXTERNAL HEMORRHOIDS [K64.4] 07/22/2007  . BACK PAIN [M54.9] 07/22/2007  . TOBACCO ABUSE [F17.200] 07/01/2007  . GERD [K21.9] 07/01/2007  . ABNORMAL THYROID FUNCTION TESTS  [R94.6] 04/22/2007   Total Time spent with patient: 25 minutes   Past Medical History:  Past Medical History:  Diagnosis Date  . Abnormal thyroid blood test   . Anemia   . Anxiety   . Depression   . Hypertension   . Tobacco abuse     Past Surgical History:  Procedure Laterality Date  . 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 Cancer Mother 88       Breast  . Colon polyps Mother   . Other Mother        colon polyps  . Dementia Mother   . Anxiety disorder Mother   . Depression Mother   . Paranoid behavior Mother   . Depression Father   . Suicidality Father        commited suicide  . Alcohol abuse Father   . Colon polyps Maternal Aunt        cancerous colon polyps  . Cancer Maternal Uncle        cancerous colon polyps  . Heart disease Other   . Other Other        thyroid disorder  . Alcohol abuse Paternal Uncle   . Dementia Maternal Grandmother   . Alcohol abuse Paternal Grandmother   . Alcohol abuse Paternal Uncle    Social History:  Social History   Substance and Sexual Activity  Alcohol Use No  . Alcohol/week: 0.3 standard drinks     Social History   Substance and Sexual Activity  Drug Use No    Social History   Socioeconomic History  . Marital status: Single  Spouse name: Not on file  . Number of children: Not on file  . Years of education: Not on file  . Highest education level: Not on file  Occupational History  . Occupation: [place]    Employer: [employer]  . Financial resource strain: Not on file  . Food insecurity    Worry: Not on file    Inability: Not on file  . Transportation needs    Medical: Not on file    Non-medical: Not on file  Tobacco Use  . Smoking status: Current Every Day Smoker    Packs/day: 1.00    Years: 27.00    Pack years: 27.00    Types: Cigarettes  . Smokeless tobacco: Never Used  Substance  and Sexual Activity  . Alcohol use: No    Alcohol/week: 0.3 standard drinks  . Drug use: No  . Sexual activity: Yes    Partners: Female    Comment: prefers female sex partner  Lifestyle  . Physical activity    Days per week: Not on file    Minutes per session: Not on file  . Stress: Not on file  Relationships  . Social Herbalist on phone: Not on file    Gets together: Not on file    Attends religious service: Not on file    Active member of club or organization: Not on file    Attends meetings of clubs or organizations: Not on file    Relationship status: Not on file  Other Topics Concern  . Not on file  Social History Narrative  . Not on file   Additional History:    Sleep: Fair  Appetite:  Fair   Assessment: GAD stable, MDD stable      Psychiatric Specialty Exam: Physical Exam  Review of Systems  Cardiovascular: Negative for chest pain.  Skin: Negative for rash.  Psychiatric/Behavioral: Negative for depression, hallucinations and suicidal ideas. The patient is not nervous/anxious.     Last menstrual period 06/24/2013.There is no height or weight on file to calculate BMI.  General Appearance: Neat and Well Groomed  Eye Contact::  Good  Speech:  Clear and Coherent  Volume:  Normal  Mood: fair  Affect:  congruent  Thought Process:  Goal Directed, Intact, Linear and Logical  Orientation:  Full (Time, Place, and Person)  Thought Content:  WDL  Suicidal Thoughts:  No  Homicidal Thoughts:  No  Memory:  Immediate;   Good Recent;   Good Remote;   Good  Judgement:  Good  Insight:  Present  Psychomotor Activity:  Normal  Concentration:  Good  Recall:  Good  Fund of Knowledge:Good  Language: Good  Akathisia:  No  Handed:  Right  AIMS (if indicated):     Assets:  Communication Skills Desire for [place] Talents/Skills Transportation Vocational/Educational  ADL's:   Intact  Cognition: WNL  Sleep:  Fair  Notes she is sleeping during the day.     Current Medications: Current Outpatient Prescriptions  Medication Sig Dispense Refill  . lamoTRIgine (LAMICTAL) 100 MG tablet Take one tablet at day for mood stabilization. 30 tablet 1  . losartan-hydrochlorothiazide (HYZAAR) 100-25 MG per tablet Take 1 tablet by mouth daily. 30 tablet 5  . venlafaxine XR (EFFEXOR-XR) 75 MG 24 hr capsule Take 3 capsules (225 mg total) by mouth daily with breakfast. 90 capsule 0  . VENTOLIN HFA 108 (90 BASE) MCG/ACT  inhaler INHALE 4 PUFFS INTO THE LUNGS 2 TIMES DAILY AS NEEDED. 18 Inhaler 3   No current facility-administered medications for this visit.    Lab Results:  No results found for this or any previous visit (from the past 48 hour(s)).  Physical Findings: AIMS: CIWA:   COWS:    Treatment Plan Summary: Medication management   MDD : fair, continue lamictal and effexor Mood disorder: not worse. Less anger. Continue lamictal  GAD: fair. Continue coping skills and effexor meds sent I discussed the assessment and treatment plan with the patient. The patient was provided an opportunity to ask questions and all were answered. The patient agreed with the plan and demonstrated an understanding of the instructions.   The patient was advised to call back or seek an in-person evaluation if the symptoms worsen or if the condition fails to improve as anticipated.  I provided 15  minutes of non-face-to-face time during this encounter. Questions addressed FU 4-5 months. Renewed meds.    Randy Castrejon  10:50 AM 10/17/2018

## 2018-11-10 ENCOUNTER — Other Ambulatory Visit: Payer: Self-pay | Admitting: Family Medicine

## 2019-01-31 ENCOUNTER — Other Ambulatory Visit (HOSPITAL_COMMUNITY): Payer: Self-pay | Admitting: Psychiatry

## 2019-01-31 ENCOUNTER — Other Ambulatory Visit: Payer: Self-pay | Admitting: Family Medicine

## 2019-02-02 NOTE — Telephone Encounter (Signed)
Needs appointment for future refills.

## 2019-02-06 ENCOUNTER — Encounter (HOSPITAL_COMMUNITY): Payer: Self-pay | Admitting: Psychiatry

## 2019-02-06 ENCOUNTER — Ambulatory Visit (INDEPENDENT_AMBULATORY_CARE_PROVIDER_SITE_OTHER): Payer: 59 | Admitting: Psychiatry

## 2019-02-06 DIAGNOSIS — F411 Generalized anxiety disorder: Secondary | ICD-10-CM

## 2019-02-06 DIAGNOSIS — F331 Major depressive disorder, recurrent, moderate: Secondary | ICD-10-CM

## 2019-02-06 MED ORDER — LAMOTRIGINE 100 MG PO TABS: ORAL_TABLET | ORAL | 0 refills | Status: DC

## 2019-02-06 NOTE — Progress Notes (Signed)
Patient ID: Brooke Kaiser, female   DOB: 1970-11-10, 49 y.o.   MRN: [id number] [facility] MD Progress Note  02/06/2019 10:14 AM Brooke Kaiser  MRN:  [id number]   Virtual Visit via Video Note  I connected with Brooke Kaiser on 02/06/19 at 10:00 AM EST by a video enabled telemedicine application and verified that I am speaking with the correct person using two identifiers.   I discussed the limitations of evaluation and management by telemedicine and the availability of in person appointments. The patient expressed understanding and agreed to proceed.  Doing fair on lamictal Had anxiety attack last night no particular reason but acknowledged she skips effexor at times and that lead back to anxiety Relationship issues, partner communication is better now Trying to work on weight loss and healthy habits Supports mom effexor helps anxiety Modifying factor: girlfriend. job Severity of depression: 7.5/10  No associated psychotic symptoms or mania.  Principal Problem: GAD and MDD Diagnosis:   Patient Active Problem List   Diagnosis Date Noted  . Elevated hemoglobin ([place]) [D58.2] 03/04/2018  . Vitamin D deficiency [E55.9] 03/04/2018  . Gouty arthritis [M10.9] 11/04/2013  . ANA positive [R76.8] 09/22/2013  . Muscle ache [M79.10] 09/22/2013  . Collagen disease ([place]) [M35.9] 09/22/2013  . Elevated erythrocyte sedimentation rate [R70.0] 09/22/2013  . IFG (impaired fasting glucose) [R73.01] 06/24/2012  . Asthmatic bronchitis [J45.909] 10/16/2011  . GAD (generalized anxiety disorder) [F41.1] 07/30/2011  . Essential hypertension [I10] 04/07/2010  . UNSPECIFIED ANEMIA [D64.9] 03/29/2010  . Major depressive disorder, recurrent episode, severe, without mention of psychotic behavior [F33.2] 07/22/2009  . OTHER ABNORMAL GLUCOSE [R73.09] 05/25/2008  . HYPERTRIGLYCERIDEMIA [E78.1] 11/04/2007  . FATIGUE [R53.81, R53.83] 11/04/2007  . EXTERNAL HEMORRHOIDS [K64.4] 07/22/2007  . BACK PAIN [M54.9] 07/22/2007   . TOBACCO ABUSE [F17.200] 07/01/2007  . GERD [K21.9] 07/01/2007  . ABNORMAL THYROID FUNCTION TESTS [R94.6] 04/22/2007   Total Time spent with patient: 25 minutes   Past Medical History:  Past Medical History:  Diagnosis Date  . Abnormal thyroid blood test   . Anemia   . Anxiety   . Depression   . Hypertension   . Tobacco abuse     Past Surgical History:  Procedure Laterality Date  . 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 Cancer Mother 19       Breast  . Colon polyps Mother   . Other Mother        colon polyps  . Dementia Mother   . Anxiety disorder Mother   . Depression Mother   . Paranoid behavior Mother   . Depression Father   . Suicidality Father        commited suicide  . Alcohol abuse Father   . Colon polyps Maternal Aunt        cancerous colon polyps  . Cancer Maternal Uncle        cancerous colon polyps  . Heart disease Other   . Other Other        thyroid disorder  . Alcohol abuse Paternal Uncle   . Dementia Maternal Grandmother   . Alcohol abuse Paternal Grandmother   . Alcohol abuse Paternal Uncle    Social History:  Social History   Substance and Sexual Activity  Alcohol Use No  . Alcohol/week: 0.3 standard drinks     Social History   Substance and Sexual Activity  Drug  Use No    Social History   Socioeconomic History  . Marital status: Single    Spouse name: Not on file  . Number of children: Not on file  . Years of education: Not on file  . Highest education level: Not on file  Occupational History  . Occupation: [place]    Employer: [employer]  Tobacco Use  . Smoking status: Current Every Day Smoker    Packs/day: 1.00    Years: 27.00    Pack years: 27.00    Types: Cigarettes  . Smokeless tobacco: Never Used  Substance and Sexual Activity  . Alcohol use: No    Alcohol/week: 0.3 standard drinks  . Drug use: No  . Sexual  activity: Yes    Partners: Female    Comment: prefers female sex partner  Other Topics Concern  . Not on file  Social History Narrative  . Not on file   Social Determinants of Health   Financial Resource Strain:   . Difficulty of Paying Living Expenses: Not on file  Food Insecurity:   . Worried About Charity fundraiser in the Last Year: Not on file  . Ran Out of Food in the Last Year: Not on file  Transportation Needs:   . Lack of Transportation (Medical): Not on file  . Lack of Transportation (Non-Medical): Not on file  Physical Activity:   . Days of Exercise per Week: Not on file  . Minutes of Exercise per Session: Not on file  Stress:   . Feeling of Stress : Not on file  Social Connections:   . Frequency of Communication with Friends and Family: Not on file  . Frequency of Social Gatherings with Friends and Family: Not on file  . Attends Religious Services: Not on file  . Active Member of Clubs or Organizations: Not on file  . Attends Archivist Meetings: Not on file  . Marital Status: Not on file   Additional History:    Sleep: Fair  Appetite:  Fair   Assessment: GAD stable, MDD stable      Psychiatric Specialty Exam: Physical Exam  Review of Systems  Cardiovascular: Negative for chest pain.  Skin: Negative for rash.  Psychiatric/Behavioral: Negative for depression, hallucinations and suicidal ideas.    Last menstrual period 06/24/2013.There is no height or weight on file to calculate BMI.  General Appearance: Neat and Well Groomed  Eye Contact::  Good  Speech:  Clear and Coherent  Volume:  Normal  Mood: fair  Affect:  congruent  Thought Process:  Goal Directed, Intact, Linear and Logical  Orientation:  Full (Time, Place, and Person)  Thought Content:  WDL  Suicidal Thoughts:  No  Homicidal Thoughts:  No  Memory:  Immediate;   Good Recent;   Good Remote;   Good  Judgement:  Good  Insight:  Present  Psychomotor Activity:  Normal   Concentration:  Good  Recall:  Good  Fund of Knowledge:Good  Language: Good  Akathisia:  No  Handed:  Right  AIMS (if indicated):     Assets:  Communication Skills Desire for [place] Talents/Skills Transportation Vocational/Educational  ADL's:  Intact  Cognition: WNL  Sleep:  Fair  Notes she is sleeping during the day.     Current Medications: Current Outpatient Prescriptions  Medication Sig Dispense Refill  . lamoTRIgine (LAMICTAL) 100 MG tablet Take one tablet at day for mood stabilization. 30 tablet 1  .  losartan-hydrochlorothiazide (HYZAAR) 100-25 MG per tablet Take 1 tablet by mouth daily. 30 tablet 5  . venlafaxine XR (EFFEXOR-XR) 75 MG 24 hr capsule Take 3 capsules (225 mg total) by mouth daily with breakfast. 90 capsule 0  . VENTOLIN HFA 108 (90 BASE) MCG/ACT inhaler INHALE 4 PUFFS INTO THE LUNGS 2 TIMES DAILY AS NEEDED. 18 Inhaler 3   No current facility-administered medications for this visit.    Lab Results:  No results found for this or any previous visit (from the past 48 hour(s)).  Physical Findings: AIMS: CIWA:   COWS:    Treatment Plan Summary: Medication management   MDD : fair, continue lamictal and effexor Mood disorder: not worse. Less anger. Continue lamictal  GAD: anxiety when skips doses, encouraged complliance with effexor No chest pain meds sent I discussed the assessment and treatment plan with the patient. The patient was provided an opportunity to ask questions and all were answered. The patient agreed with the plan and demonstrated an understanding of the instructions.   The patient was advised to call back or seek an in-person evaluation if the symptoms worsen or if the condition fails to improve as anticipated.  I provided 15  minutes of non-face-to-face time during this encounter. Questions addressed FU 4-5 months. Renewed meds.    Paislea Hatton   10:14 AM 02/06/2019

## 2019-05-09 ENCOUNTER — Other Ambulatory Visit (HOSPITAL_COMMUNITY): Payer: Self-pay | Admitting: Psychiatry

## 2019-05-09 ENCOUNTER — Other Ambulatory Visit: Payer: Self-pay | Admitting: Family Medicine

## 2019-05-11 ENCOUNTER — Other Ambulatory Visit (HOSPITAL_COMMUNITY): Payer: Self-pay

## 2019-05-11 MED ORDER — VENLAFAXINE HCL ER 75 MG PO CP24: ORAL_CAPSULE | ORAL | 0 refills | Status: DC

## 2019-05-26 ENCOUNTER — Other Ambulatory Visit: Payer: Self-pay

## 2019-05-26 ENCOUNTER — Encounter: Payer: Self-pay | Admitting: Family Medicine

## 2019-05-26 ENCOUNTER — Ambulatory Visit (INDEPENDENT_AMBULATORY_CARE_PROVIDER_SITE_OTHER): Payer: 59 | Admitting: Family Medicine

## 2019-05-26 VITALS — BP 132/73 | HR 83 | Ht 69.0 in | Wt 237.0 lb

## 2019-05-26 DIAGNOSIS — L989 Disorder of the skin and subcutaneous tissue, unspecified: Secondary | ICD-10-CM

## 2019-05-26 DIAGNOSIS — Z23 Encounter for immunization: Secondary | ICD-10-CM | POA: Diagnosis not present

## 2019-05-26 DIAGNOSIS — R05 Cough: Secondary | ICD-10-CM

## 2019-05-26 DIAGNOSIS — E781 Pure hyperglyceridemia: Secondary | ICD-10-CM

## 2019-05-26 DIAGNOSIS — K2100 Gastro-esophageal reflux disease with esophagitis, without bleeding: Secondary | ICD-10-CM

## 2019-05-26 DIAGNOSIS — I10 Essential (primary) hypertension: Secondary | ICD-10-CM | POA: Diagnosis not present

## 2019-05-26 DIAGNOSIS — Z1231 Encounter for screening mammogram for malignant neoplasm of breast: Secondary | ICD-10-CM

## 2019-05-26 DIAGNOSIS — R059 Cough, unspecified: Secondary | ICD-10-CM

## 2019-05-26 DIAGNOSIS — F332 Major depressive disorder, recurrent severe without psychotic features: Secondary | ICD-10-CM

## 2019-05-26 MED ORDER — DEXILANT 30 MG PO CPDR: 30.0000 mg | DELAYED_RELEASE_CAPSULE | Freq: Every day | ORAL | 3 refills | Status: DC

## 2019-05-26 MED ORDER — ALBUTEROL SULFATE HFA 108 (90 BASE) MCG/ACT IN AERS: INHALATION_SPRAY | RESPIRATORY_TRACT | 99 refills | Status: DC

## 2019-05-26 MED ORDER — LOSARTAN POTASSIUM 100 MG PO TABS: 100.0000 mg | ORAL_TABLET | Freq: Every day | ORAL | 1 refills | Status: DC

## 2019-05-26 NOTE — Assessment & Plan Note (Signed)
Always with psychiatry they manage her medication she reports that she has not had any major changes to her regimen recently.

## 2019-05-26 NOTE — Assessment & Plan Note (Addendum)
Well controlled. Continue current regimen. Follow up in  6 months. Encouraged her to work on exercise.

## 2019-05-26 NOTE — Assessment & Plan Note (Signed)
Due to recheck lipids. 

## 2019-05-26 NOTE — Progress Notes (Signed)
Established Patient Office Visit  Subjective:  Patient ID: Brooke Kaiser, female    DOB: 10/06/1970  Age: 49 y.o. MRN: [id number]  CC:  Chief Complaint  Patient presents with  . Hypertension    HPI Brooke Kaiser presents for follow-up.  She has not been seen in a year.  Hypertension- Pt denies chest pain, SOB, dizziness, or heart palpitations.  Taking meds as directed w/o problems.  Denies medication side effects.   Follows with psychiatry for her depression she is currently on Lamictal and Effexor.  No recent changes to her regimen.  Does complain of a chronic cough that is been going on for anywhere between 6 to 10 months.  She is a current regular smoker.  She is worried about the possibility of lung cancer.  She says she does tend to have a cough first thing in the morning but then it also persist during the day.  She has had some heartburn or reflux symptoms.  She says she used to take Zantac until it was pulled from the market she has tried Tagamet and gets some relief from it but it does not seem to work right as well.  It sounds like she may have also tried Prevacid recently.  She used Exelon in the past and says that actually worked really well for her.   Past Medical History:  Diagnosis Date  . Abnormal thyroid blood test   . Anemia   . Anxiety   . Depression   . Hypertension   . Tobacco abuse     Past Surgical History:  Procedure Laterality Date  . 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 Cancer Mother 48       Breast  . Colon polyps Mother   . Other Mother        colon polyps  . Dementia Mother   . Anxiety disorder Mother   . Depression Mother   . Paranoid behavior Mother   . Depression Father   . Suicidality Father        commited suicide  . Alcohol abuse Father   . Colon polyps Maternal Aunt        cancerous colon polyps  . Cancer Maternal Uncle        cancerous colon polyps   . Heart disease Other   . Other Other        thyroid disorder  . Alcohol abuse Paternal Uncle   . Dementia Maternal Grandmother   . Alcohol abuse Paternal Grandmother   . Alcohol abuse Paternal Uncle     Social History   Socioeconomic History  . Marital status: Single    Spouse name: Not on file  . Number of children: Not on file  . Years of education: Not on file  . Highest education level: Not on file  Occupational History  . Occupation: [employer]    Employer: [employer]  Tobacco Use  . Smoking status: Current Every Day Smoker    Packs/day: 1.00    Years: 27.00    Pack years: 27.00    Types: Cigarettes  . Smokeless tobacco: Never Used  Substance and Sexual Activity  . Alcohol use: No    Alcohol/week: 0.3 standard drinks  . Drug use: No  . Sexual activity: Yes    Partners: Female    Comment: prefers female sex partner  Other Topics Concern  .  Not on file  Social History Narrative  . Not on file   Social Determinants of Health   Financial Resource Strain:   . Difficulty of Paying Living Expenses:   Food Insecurity:   . Worried About Charity fundraiser in the Last Year:   . Arboriculturist in the Last Year:   Transportation Needs:   . Film/video editor (Medical):   Marland Kitchen Lack of Transportation (Non-Medical):   Physical Activity:   . Days of Exercise per Week:   . Minutes of Exercise per Session:   Stress:   . Feeling of Stress :   Social Connections:   . Frequency of Communication with Friends and Family:   . Frequency of Social Gatherings with Friends and Family:   . Attends Religious Services:   . Active Member of Clubs or Organizations:   . Attends Archivist Meetings:   Marland Kitchen Marital Status:   Intimate Partner Violence:   . Fear of Current or Ex-Partner:   . Emotionally Abused:   Marland Kitchen Physically Abused:   . Sexually Abused:     Outpatient Medications Prior to Visit  Medication Sig Dispense Refill  . lamoTRIgine (LAMICTAL) 100  MG tablet TAKE ONE TABLET BY MOUTH ONCE DAILY FOR MOOD STABILIZATION. 90 tablet 0  . venlafaxine XR (EFFEXOR-XR) 75 MG 24 hr capsule TAKE 3 CAPSULES IN THE MORNING 270 capsule 0  . albuterol (VENTOLIN HFA) 108 (90 Base) MCG/ACT inhaler INHALE 2-4 PUFFS INTO THE LUNGS 2 TIMES DAILY AS NEEDED. 18 Inhaler 1  . losartan (COZAAR) 100 MG tablet Take 1 tablet (100 mg total) by mouth daily. 30 DAYS GIVEN. LAST REFILL 30 tablet 0  . doxycycline (VIBRA-TABS) 100 MG tablet Take 1 tablet (100 mg total) by mouth 2 (two) times daily. 20 tablet 0  . Mometasone Furoate (ASMANEX HFA) 100 MCG/ACT AERO Inhale 2 puffs into the lungs 2 (two) times daily. 1 Inhaler 1   No facility-administered medications prior to visit.    Allergies  Allergen Reactions  . Clavulanic Acid Nausea And Vomiting  . Sanctura [Trospium] Other (See Comments)    Dizziness    ROS Review of Systems    Objective:    Physical Exam  Constitutional: She is oriented to person, place, and time. She appears well-developed and well-nourished.  HENT:  Head: Normocephalic and atraumatic.  Cardiovascular: Normal rate, regular rhythm and normal heart sounds.  Pulmonary/Chest: Effort normal and breath sounds normal.  Neurological: She is alert and oriented to person, place, and time.  Skin: Skin is warm and dry.  Psychiatric: She has a normal mood and affect. Her behavior is normal.    BP 132/73   Pulse 83   Ht 5\' 9"  (1.753 m)   Wt 237 lb (107.5 kg)   LMP 06/24/2013   SpO2 99%   BMI 35.00 kg/m  Wt Readings from Last 3 Encounters:  05/26/19 237 lb (107.5 kg)  04/22/18 235 lb (106.6 kg)  03/04/18 241 lb (109.3 kg)     There are no preventive care reminders to display for this patient.  There are no preventive care reminders to display for this patient.  Lab Results  Component Value Date   TSH 1.26 09/10/2017   Lab Results  Component Value Date   WBC 8.7 03/04/2018   HGB 15.1 03/04/2018   HCT 42.4 03/04/2018   MCV 87.6  03/04/2018   PLT 274 03/04/2018   Lab Results  Component Value Date   NA  140 03/04/2018   K 4.6 03/04/2018   CO2 28 03/04/2018   GLUCOSE 105 (H) 03/04/2018   BUN 11 03/04/2018   CREATININE 0.79 03/04/2018   BILITOT 0.5 09/10/2017   ALKPHOS 58 09/14/2016   AST 12 09/10/2017   ALT 20 09/10/2017   PROT 7.2 09/10/2017   ALBUMIN 4.0 09/14/2016   CALCIUM 9.7 03/04/2018   GFR 89 11/08/2011   Lab Results  Component Value Date   CHOL 170 09/24/2017   Lab Results  Component Value Date   HDL 33 (L) 09/24/2017   Lab Results  Component Value Date   LDLCALC 102 (H) 09/24/2017   Lab Results  Component Value Date   TRIG 229 (H) 09/24/2017   Lab Results  Component Value Date   CHOLHDL 5.2 (H) 09/24/2017   Lab Results  Component Value Date   HGBA1C 5.7 06/22/2016      Assessment & Plan:   Problem List Items Addressed This Visit      Cardiovascular and Mediastinum   Essential hypertension    Well controlled. Continue current regimen. Follow up in  6 months. Encouraged her to work on exercise.       Relevant Medications   losartan (COZAAR) 100 MG tablet   Other Relevant Orders   Lipid Panel w/reflex Direct LDL   COMPLETE METABOLIC PANEL WITH GFR   TSH     Digestive   GERD   Relevant Medications   Dexlansoprazole (DEXILANT) 30 MG capsule     Other   Severe episode of recurrent major depressive disorder ([place])    Always with psychiatry they manage her medication she reports that she has not had any major changes to her regimen recently.      HYPERTRIGLYCERIDEMIA    Due to recheck lipids.      Relevant Medications   losartan (COZAAR) 100 MG tablet    Other Visit Diagnoses    Screening mammogram, encounter for    -  Primary   Relevant Orders   MM 3D SCREEN BREAST BILATERAL   Need for Tdap vaccination       Need for tetanus, diphtheria, and acellular pertussis (Tdap) vaccine in patient of adolescent age or older       Relevant Orders   Tdap vaccine greater  than or equal to 7yo IM (Completed)   Cough       Relevant Orders   DG Chest 2 View   Skin problem       Relevant Orders   Ambulatory referral to Dermatology     Chronic cough-recommend further evaluation with chest x-ray since she is a smoker.  We discussed that it could also be reflux triggered and discussed getting her back on a reflux medicine consistently for couple weeks to see if she notices improvement.  If that is not helpful then consider treating potential postnasal drip as a cause.  Tdap dated today.  Phillip Heal ordered encourage her to go downstairs to go ahead and schedule the appointment time  Meds ordered this encounter  Medications  . albuterol (VENTOLIN HFA) 108 (90 Base) MCG/ACT inhaler    Sig: INHALE 2-4 PUFFS INTO THE LUNGS 2 TIMES DAILY AS NEEDED.    Dispense:  18 g    Refill:  prn  . losartan (COZAAR) 100 MG tablet    Sig: Take 1 tablet (100 mg total) by mouth daily.    Dispense:  90 tablet    Refill:  1  . Dexlansoprazole (DEXILANT) 30 MG capsule  Sig: Take 1 capsule (30 mg total) by mouth daily.    Dispense:  30 capsule    Refill:  3    Follow-up: Return in 6 months (on 11/25/2019) for bp.    [name], MD

## 2019-06-03 ENCOUNTER — Other Ambulatory Visit: Payer: Self-pay

## 2019-06-03 ENCOUNTER — Ambulatory Visit (INDEPENDENT_AMBULATORY_CARE_PROVIDER_SITE_OTHER): Payer: 59

## 2019-06-03 DIAGNOSIS — Z1231 Encounter for screening mammogram for malignant neoplasm of breast: Secondary | ICD-10-CM | POA: Diagnosis not present

## 2019-06-03 DIAGNOSIS — R05 Cough: Secondary | ICD-10-CM | POA: Diagnosis not present

## 2019-06-03 DIAGNOSIS — R059 Cough, unspecified: Secondary | ICD-10-CM

## 2019-06-04 LAB — COMPLETE METABOLIC PANEL WITH GFR
AG Ratio: 1.7 (calc) (ref 1.0–2.5)
ALT: 18 U/L (ref 6–29)
AST: 13 U/L (ref 10–35)
Albumin: 4.3 g/dL (ref 3.6–5.1)
Alkaline phosphatase (APISO): 56 U/L (ref 31–125)
BUN: 17 mg/dL (ref 7–25)
CO2: 26 mmol/L (ref 20–32)
Calcium: 9.4 mg/dL (ref 8.6–10.2)
Chloride: 105 mmol/L (ref 98–110)
Creat: 0.72 mg/dL (ref 0.50–1.10)
GFR, Est African American: 114 mL/min/{1.73_m2} (ref >=60)
GFR, Est Non African American: 98 mL/min/{1.73_m2} (ref >=60)
Globulin: 2.5 g/dL (calc) (ref 1.9–3.7)
Glucose, Bld: 121 mg/dL — ABNORMAL HIGH (ref 65–99)
Potassium: 4.5 mmol/L (ref 3.5–5.3)
Sodium: 138 mmol/L (ref 135–146)
Total Bilirubin: 0.4 mg/dL (ref 0.2–1.2)
Total Protein: 6.8 g/dL (ref 6.1–8.1)

## 2019-06-04 LAB — LIPID PANEL W/REFLEX DIRECT LDL
Cholesterol: 188 mg/dL (ref <200)
HDL: 34 mg/dL — ABNORMAL LOW (ref >=50)
LDL Cholesterol (Calc): 113 mg/dL (calc) — ABNORMAL HIGH
Non-HDL Cholesterol (Calc): 154 mg/dL (calc) — ABNORMAL HIGH (ref <130)
Total CHOL/HDL Ratio: 5.5 (calc) — ABNORMAL HIGH (ref <5.0)
Triglycerides: 285 mg/dL — ABNORMAL HIGH (ref <150)

## 2019-06-04 LAB — TSH: TSH: 0.98 mIU/L

## 2019-06-18 ENCOUNTER — Telehealth (INDEPENDENT_AMBULATORY_CARE_PROVIDER_SITE_OTHER): Payer: 59 | Admitting: Psychiatry

## 2019-06-18 ENCOUNTER — Encounter (HOSPITAL_COMMUNITY): Payer: Self-pay | Admitting: Psychiatry

## 2019-06-18 DIAGNOSIS — R454 Irritability and anger: Secondary | ICD-10-CM | POA: Diagnosis not present

## 2019-06-18 DIAGNOSIS — F411 Generalized anxiety disorder: Secondary | ICD-10-CM | POA: Diagnosis not present

## 2019-06-18 DIAGNOSIS — F331 Major depressive disorder, recurrent, moderate: Secondary | ICD-10-CM

## 2019-06-18 NOTE — Progress Notes (Signed)
Patient ID: Brooke Kaiser, female   DOB: Jul 10, 1970, 49 y.o.   MRN: [id number] [facility] MD Progress Note  06/18/2019 10:01 AM Brooke Kaiser  MRN:  [id number]   Virtual Visit via Video Note  I connected with Brooke Kaiser on 06/18/19 at 10:00 AM EDT by a video enabled telemedicine application and verified that I am speaking with the correct person using two identifiers.   I discussed the limitations of evaluation and management by telemedicine and the availability of in person appointments. The patient expressed understanding and agreed to proceed.  Doing fair on lamictal. No rash Dealing with mom can be stressful otherwise working FT  Trying to work on [employer] loss and healthy habits Supports mom effexor helps anxiety Modifying factor: girlfriend. Job  Severity of depression: 7.5/10  No associated psychotic symptoms or mania.  Principal Problem: GAD and MDD Diagnosis:   Patient Active Problem List   Diagnosis Date Noted  . Elevated hemoglobin ([place]) [D58.2] 03/04/2018  . Vitamin D deficiency [E55.9] 03/04/2018  . Gouty arthritis [M10.9] 11/04/2013  . ANA positive [R76.8] 09/22/2013  . Muscle ache [M79.10] 09/22/2013  . Collagen disease ([place]) [M35.9] 09/22/2013  . Elevated erythrocyte sedimentation rate [R70.0] 09/22/2013  . IFG (impaired fasting glucose) [R73.01] 06/24/2012  . Asthmatic bronchitis [J45.909] 10/16/2011  . GAD (generalized anxiety disorder) [F41.1] 07/30/2011  . Essential hypertension [I10] 04/07/2010  . UNSPECIFIED ANEMIA [D64.9] 03/29/2010  . Severe episode of recurrent major depressive disorder ([place]) [F33.2] 07/22/2009  . OTHER ABNORMAL GLUCOSE [R73.09] 05/25/2008  . HYPERTRIGLYCERIDEMIA [E78.1] 11/04/2007  . FATIGUE [R53.81, R53.83] 11/04/2007  . EXTERNAL HEMORRHOIDS [K64.4] 07/22/2007  . BACK PAIN [M54.9] 07/22/2007  . TOBACCO ABUSE [F17.200] 07/01/2007  . GERD [K21.9] 07/01/2007  . ABNORMAL THYROID FUNCTION TESTS [R94.6] 04/22/2007   Total Time spent with  patient: 25 minutes   Past Medical History:  Past Medical History:  Diagnosis Date  . Abnormal thyroid blood test   . Anemia   . Anxiety   . Depression   . Hypertension   . Tobacco abuse     Past Surgical History:  Procedure Laterality Date  . 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 Cancer Mother 36       Breast  . Colon polyps Mother   . Other Mother        colon polyps  . Dementia Mother   . Anxiety disorder Mother   . Depression Mother   . Paranoid behavior Mother   . Depression Father   . Suicidality Father        commited suicide  . Alcohol abuse Father   . Colon polyps Maternal Aunt        cancerous colon polyps  . Cancer Maternal Uncle        cancerous colon polyps  . Heart disease Other   . Other Other        thyroid disorder  . Alcohol abuse Paternal Uncle   . Dementia Maternal Grandmother   . Alcohol abuse Paternal Grandmother   . Alcohol abuse Paternal Uncle    Social History:  Social History   Substance and Sexual Activity  Alcohol Use No  . Alcohol/week: 0.3 standard drinks     Social History   Substance and Sexual Activity  Drug Use No    Social History   Socioeconomic History  . Marital status: Single  Spouse name: Not on file  . Number of children: Not on file  . Years of education: Not on file  . Highest education level: Not on file  Occupational History  . Occupation: [employer]    Employer: [employer]  Tobacco Use  . Smoking status: Current Every Day Smoker    Packs/day: 1.00    Years: 27.00    Pack years: 27.00    Types: Cigarettes  . Smokeless tobacco: Never Used  Substance and Sexual Activity  . Alcohol use: No    Alcohol/week: 0.3 standard drinks  . Drug use: No  . Sexual activity: Yes    Partners: Female    Comment: prefers female sex partner  Other Topics Concern  . Not on file  Social History Narrative  . Not  on file   Social Determinants of Health   Financial Resource Strain:   . Difficulty of Paying Living Expenses:   Food Insecurity:   . Worried About Charity fundraiser in the Last Year:   . Arboriculturist in the Last Year:   Transportation Needs:   . Film/video editor (Medical):   Marland Kitchen Lack of Transportation (Non-Medical):   Physical Activity:   . Days of Exercise per Week:   . Minutes of Exercise per Session:   Stress:   . Feeling of Stress :   Social Connections:   . Frequency of Communication with Friends and Family:   . Frequency of Social Gatherings with Friends and Family:   . Attends Religious Services:   . Active Member of Clubs or Organizations:   . Attends Archivist Meetings:   Marland Kitchen Marital Status:    Additional History:    Sleep: Fair  Appetite:  Fair   Assessment: GAD stable, MDD stable      Psychiatric Specialty Exam: Physical Exam  Review of Systems  Cardiovascular: Negative for chest pain.  Skin: Negative for rash.  Psychiatric/Behavioral: Negative for depression, hallucinations and suicidal ideas.    Last menstrual period 06/24/2013.There is no height or weight on file to calculate BMI.  General Appearance: Neat and Well Groomed  Eye Contact::  Good  Speech:  Clear and Coherent  Volume:  Normal  Mood: fair  Affect:  congruent  Thought Process:  Goal Directed, Intact, Linear and Logical  Orientation:  Full (Time, Place, and Person)  Thought Content:  WDL  Suicidal Thoughts:  No  Homicidal Thoughts:  No  Memory:  Immediate;   Good Recent;   Good Remote;   Good  Judgement:  Good  Insight:  Present  Psychomotor Activity:  Normal  Concentration:  Good  Recall:  Good  Fund of Knowledge:Good  Language: Good  Akathisia:  No  Handed:  Right  AIMS (if indicated):     Assets:  Communication Skills Desire for [place] Talents/Skills Transportation Vocational/Educational  ADL's:  Intact  Cognition: WNL  Sleep:  Fair  Notes she is sleeping during the day.     Current Medications: Current Outpatient Prescriptions  Medication Sig Dispense Refill  . lamoTRIgine (LAMICTAL) 100 MG tablet Take one tablet at day for mood stabilization. 30 tablet 1  . losartan-hydrochlorothiazide (HYZAAR) 100-25 MG per tablet Take 1 tablet by mouth daily. 30 tablet 5  . venlafaxine XR (EFFEXOR-XR) 75 MG 24 hr capsule Take 3 capsules (225 mg total) by mouth daily with breakfast. 90 capsule 0  . VENTOLIN HFA 108 (90  BASE) MCG/ACT inhaler INHALE 4 PUFFS INTO THE LUNGS 2 TIMES DAILY AS NEEDED. 18 Inhaler 3   No current facility-administered medications for this visit.    Lab Results:  No results found for this or any previous visit (from the past 48 hour(s)).  Physical Findings: AIMS: CIWA:   COWS:    Treatment Plan Summary: Medication management   MDD : fair continue lamictal, effexor Mood disorder: not worse. Less anger. Continue lamictal  GAD: manageable on effexor, continue meds I discussed the assessment and treatment plan with the patient. The patient was provided an opportunity to ask questions and all were answered. The patient agreed with the plan and demonstrated an understanding of the instructions.   The patient was advised to call back or seek an in-person evaluation if the symptoms worsen or if the condition fails to improve as anticipated.  I provided 15  minutes of non-face-to-face time during this encounter. Questions addressed FU 6 months. Has meds for next 72months Megumi Treaster  10:01 AM 06/18/2019

## 2019-07-08 ENCOUNTER — Telehealth: Payer: Self-pay | Admitting: Family Medicine

## 2019-07-08 NOTE — Telephone Encounter (Signed)
Received fax for PA on Ventolin HFA sent through cover my meds waiting on determination. - CF

## 2019-07-29 ENCOUNTER — Encounter: Payer: Self-pay | Admitting: Nurse Practitioner

## 2019-08-13 ENCOUNTER — Other Ambulatory Visit (HOSPITAL_COMMUNITY): Payer: Self-pay | Admitting: Psychiatry

## 2019-08-19 ENCOUNTER — Ambulatory Visit (INDEPENDENT_AMBULATORY_CARE_PROVIDER_SITE_OTHER): Payer: 59 | Admitting: Family Medicine

## 2019-08-19 ENCOUNTER — Encounter: Payer: Self-pay | Admitting: Family Medicine

## 2019-08-19 ENCOUNTER — Telehealth: Payer: Self-pay | Admitting: Family Medicine

## 2019-08-19 VITALS — BP 148/73 | HR 72 | Ht 68.9 in | Wt 232.7 lb

## 2019-08-19 DIAGNOSIS — W57XXXA Bitten or stung by nonvenomous insect and other nonvenomous arthropods, initial encounter: Secondary | ICD-10-CM

## 2019-08-19 DIAGNOSIS — R519 Headache, unspecified: Secondary | ICD-10-CM

## 2019-08-19 MED ORDER — DOXYCYCLINE HYCLATE 100 MG PO TABS
100.0000 mg | ORAL_TABLET | Freq: Two times a day (BID) | ORAL | 0 refills | Status: AC
Start: 2019-08-19 — End: 2019-09-02

## 2019-08-19 NOTE — Patient Instructions (Signed)

## 2019-08-19 NOTE — Assessment & Plan Note (Signed)
Having increased headaches, intermittent chills and body aches since removal of tick.  Checking RMSF and Lyme serology today  Cover empirically with doxycycline x14 days.  Instructed to call if developing new or worsening symptoms.

## 2019-08-19 NOTE — Telephone Encounter (Signed)
White somePlease call patient and let her know that she is due for hemoglobin A1c when I look back she we have not actually checked 1 time.  And on her last set of labs her glucose was mildly elevated.  I know she has a follow-up in November but if we can check it sooner that would be great.

## 2019-08-19 NOTE — Progress Notes (Signed)
Brooke Kaiser - 49 y.o. female MRN [id number]  Date of birth: [birth year]  Subjective Chief Complaint  Patient presents with  . Tick Removal    HPI Brooke Kaiser is a 49 y.o. female here today with complaint of tick bite x2.  Ticks removed a few days ago. One bite is located on posterior R thigh and the other along the median thigh..  She reports ticks were small and minimally engorged.  She has started having headache and body aches  with some chills and fatigue for the past couple of days.  She has not had fever, rash, abdominal pain, nausea.   ROS:  A comprehensive ROS was completed and negative except as noted per HPI  Allergies  Allergen Reactions  . Clavulanic Acid Nausea And Vomiting  . Sanctura [Trospium] Other (See Comments)    Dizziness    Past Medical History:  Diagnosis Date  . Abnormal thyroid blood test   . Anemia   . Anxiety   . Depression   . Hypertension   . Tobacco abuse     Past Surgical History:  Procedure Laterality Date  . 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 Marital status: Single    Spouse name: Not on file  . Number of children: Not on file  . Years of education: Not on file  . Highest education level: Not on file  Occupational History  . Occupation: [employer]    Employer: [employer]  Tobacco Use  . Smoking status: Current Every Day Smoker    Packs/day: 1.00    Years: 27.00    Pack years: 27.00    Types: Cigarettes  . Smokeless tobacco: Never Used  Vaping Use  . Vaping Use: Never used  Substance and Sexual Activity  . Alcohol use: No    Alcohol/week: 0.3 standard drinks  . Drug use: No  . Sexual activity: Yes    Partners: Female    Comment: prefers female sex partner  Other Topics Concern  . Not on file  Social History Narrative  . Not on file   Social Determinants of Health   Financial Resource Strain:   . Difficulty of Paying Living Expenses:    Food Insecurity:   . Worried About Charity fundraiser in the Last Year:   . Arboriculturist in the Last Year:   Transportation Needs:   . Film/video editor (Medical):   Marland Kitchen Lack of Transportation (Non-Medical):   Physical Activity:   . Days of Exercise per Week:   . Minutes of Exercise per Session:   Stress:   . Feeling of Stress :   Social Connections:   . Frequency of Communication with Friends and Family:   . Frequency of Social Gatherings with Friends and Family:   . Attends Religious Services:   . Active Member of Clubs or Organizations:   . Attends Archivist Meetings:   Marland Kitchen Marital Status:     Family History  Problem Relation Age of Onset  . Cancer Mother 33       Breast  . Colon polyps Mother   . Other Mother        colon polyps  . Dementia Mother   . Anxiety disorder Mother   . Depression Mother   . Paranoid behavior Mother   . Depression Father   . Suicidality Father  commited suicide  . Alcohol abuse Father   . Colon polyps Maternal Aunt        cancerous colon polyps  . Cancer Maternal Uncle        cancerous colon polyps  . Heart disease Other   . Other Other        thyroid disorder  . Alcohol abuse Paternal Uncle   . Dementia Maternal Grandmother   . Alcohol abuse Paternal Grandmother   . Alcohol abuse Paternal Uncle     Health Maintenance  Topic Date Due  . COVID-19 Vaccine (1) 01/29/2020 (Originally 12/17/1981)  . INFLUENZA VACCINE  08/30/2019  . PAP SMEAR-Modifier  09/11/2022  . TETANUS/TDAP  05/25/2029  . Hepatitis C Screening  Completed  . HIV Screening  Completed     ----------------------------------------------------------------------------------------------------------------------------------------------------------------------------------------------------------------- Physical Exam BP (!) 148/73 (BP Location: Left Arm, Patient Position: Sitting, Cuff Size: Large)   Pulse 72   Ht 5' 8.9" (1.75 m)   Wt 232 lb  11.2 oz (105.6 kg)   LMP 06/24/2013   SpO2 96%   BMI 34.47 kg/m   Physical Exam Constitutional:      Appearance: Normal appearance.  Eyes:     General: No scleral icterus. Cardiovascular:     Rate and Rhythm: Normal rate and regular rhythm.  Pulmonary:     Effort: Pulmonary effort is normal.     Breath sounds: Normal breath sounds.  Musculoskeletal:     Cervical back: Neck supple.  Skin:    Comments: Erythematous papule over posterior thigh.  No fluctuance or eschar noted.    Neurological:     General: No focal deficit present.     Mental Status: She is alert.  Psychiatric:        Mood and Affect: Mood normal.        Behavior: Behavior normal.     ------------------------------------------------------------------------------------------------------------------------------------------------------------------------------------------------------------------- Assessment and Plan  Tick bite Having increased headaches, intermittent chills and body aches since removal of tick.  Checking RMSF and Lyme serology today  Cover empirically with doxycycline x14 days.  Instructed to call if developing new or worsening symptoms.    Meds ordered this encounter  Medications  . doxycycline (VIBRA-TABS) 100 MG tablet    Sig: Take 1 tablet (100 mg total) by mouth 2 (two) times daily for 14 days.    Dispense:  28 tablet    Refill:  0    No follow-ups on file.    This visit occurred during the SARS-CoV-2 public health emergency.  Safety protocols were in place, including screening questions prior to the visit, additional usage of staff PPE, and extensive cleaning of exam room while observing appropriate contact time as indicated for disinfecting solutions.

## 2019-08-20 LAB — ROCKY MTN SPOTTED FVR ABS PNL(IGG+IGM)
RMSF IgG: NOT DETECTED
RMSF IgM: NOT DETECTED

## 2019-08-20 LAB — B. BURGDORFI ANTIBODIES: B burgdorferi Ab IgG+IgM: <0.9 index

## 2019-08-24 NOTE — Telephone Encounter (Signed)
Called 2x. Left pt voicemail with recommendations.

## 2019-08-28 ENCOUNTER — Ambulatory Visit: Payer: Self-pay | Admitting: Physician Assistant

## 2019-08-31 ENCOUNTER — Other Ambulatory Visit: Payer: Self-pay

## 2019-08-31 ENCOUNTER — Ambulatory Visit (INDEPENDENT_AMBULATORY_CARE_PROVIDER_SITE_OTHER): Payer: 59 | Admitting: Family Medicine

## 2019-08-31 ENCOUNTER — Encounter: Payer: Self-pay | Admitting: Family Medicine

## 2019-08-31 VITALS — BP 125/67 | HR 80 | Wt 237.0 lb

## 2019-08-31 DIAGNOSIS — D582 Other hemoglobinopathies: Secondary | ICD-10-CM | POA: Diagnosis not present

## 2019-08-31 DIAGNOSIS — W57XXXD Bitten or stung by nonvenomous insect and other nonvenomous arthropods, subsequent encounter: Secondary | ICD-10-CM | POA: Diagnosis not present

## 2019-08-31 DIAGNOSIS — R7301 Impaired fasting glucose: Secondary | ICD-10-CM | POA: Diagnosis not present

## 2019-08-31 DIAGNOSIS — I10 Essential (primary) hypertension: Secondary | ICD-10-CM

## 2019-08-31 LAB — POCT GLYCOSYLATED HEMOGLOBIN (HGB A1C)
HbA1c POC (<> result, manual entry): 5.9 % (ref 4.0–5.6)
HbA1c, POC (controlled diabetic range): 5.9 % (ref 0.0–7.0)
HbA1c, POC (prediabetic range): 5.9 % (ref 5.7–6.4)
Hemoglobin A1C: 5.9 % — AB (ref 4.0–5.6)

## 2019-08-31 NOTE — Progress Notes (Signed)
Established Patient Office Visit  Subjective:  Patient ID: Brooke Kaiser, female    DOB: 1970/11/20  Age: 49 y.o. MRN: [id number]  CC: No chief complaint on file.   HPI Brooke Kaiser presents for   Hypertension- Pt denies chest pain, SOB, dizziness, or heart palpitations.  Taking meds as directed w/o problems.  Denies medication side effects.    She also recently had labs back in May showed a glucose of 121.  In looking back her last hemoglobin A1c was 5.7 in the prediabetes range about 2 years ago. Lab Results  Component Value Date   HGBA1C 5.9 (A) 08/31/2019   HGBA1C 5.9 08/31/2019   HGBA1C 5.9 08/31/2019   HGBA1C 5.9 08/31/2019   Also wanted to f/u on recent tick bite.  She said she had a tick bite around July 16.  She noticed something on the right posterior thigh near the buttock area.  She says about 3 days later she actually started to feel bad she started having some joint aches.  And headaches.  She says she also noticed what look like a ringing right around the tick bite area about the size of a nickel.  She actually came in the following day and saw one of her providers and was placed on doxycycline.  She still has about 2 days left of medication and she says overall she is feeling better she had symptoms are not completely resolved but they are improved significantly.   Past Medical History:  Diagnosis Date  . Abnormal thyroid blood test   . Anemia   . Anxiety   . Depression   . Hypertension   . Tobacco abuse     Past Surgical History:  Procedure Laterality Date  . 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 Cancer Mother 30       Breast  . Colon polyps Mother   . Other Mother        colon polyps  . Dementia Mother   . Anxiety disorder Mother   . Depression Mother   . Paranoid behavior Mother   . Depression Father   . Suicidality Father        commited suicide  . Alcohol abuse  Father   . Colon polyps Maternal Aunt        cancerous colon polyps  . Cancer Maternal Uncle        cancerous colon polyps  . Heart disease Other   . Other Other        thyroid disorder  . Alcohol abuse Paternal Uncle   . Dementia Maternal Grandmother   . Alcohol abuse Paternal Grandmother   . Alcohol abuse Paternal Uncle     Social History   Socioeconomic History  . Marital status: Single    Spouse name: Not on file  . Number of children: Not on file  . Years of education: Not on file  . Highest education level: Not on file  Occupational History  . Occupation: [place]    Employer: [employer]  Tobacco Use  . Smoking status: Current Every Day Smoker    Packs/day: 1.00    Years: 27.00    Pack years: 27.00    Types: Cigarettes  . Smokeless tobacco: Never Used  Vaping Use  . Vaping Use: Never used  Substance and Sexual Activity  . Alcohol use: No  Alcohol/week: 0.3 standard drinks  . Drug use: No  . Sexual activity: Yes    Partners: Female    Comment: prefers female sex partner  Other Topics Concern  . Not on file  Social History Narrative  . Not on file   Social Determinants of Health   Financial Resource Strain:   . Difficulty of Paying Living Expenses:   Food Insecurity:   . Worried About Charity fundraiser in the Last Year:   . Arboriculturist in the Last Year:   Transportation Needs:   . Film/video editor (Medical):   Marland Kitchen Lack of Transportation (Non-Medical):   Physical Activity:   . Days of Exercise per Week:   . Minutes of Exercise per Session:   Stress:   . Feeling of Stress :   Social Connections:   . Frequency of Communication with Friends and Family:   . Frequency of Social Gatherings with Friends and Family:   . Attends Religious Services:   . Active Member of Clubs or Organizations:   . Attends Archivist Meetings:   Marland Kitchen Marital Status:   Intimate Partner Violence:   . Fear of Current or Ex-Partner:   .  Emotionally Abused:   Marland Kitchen Physically Abused:   . Sexually Abused:     Outpatient Medications Prior to Visit  Medication Sig Dispense Refill  . albuterol (VENTOLIN HFA) 108 (90 Base) MCG/ACT inhaler INHALE 2-4 PUFFS INTO THE LUNGS 2 TIMES DAILY AS NEEDED. 18 g prn  . doxycycline (VIBRA-TABS) 100 MG tablet Take 1 tablet (100 mg total) by mouth 2 (two) times daily for 14 days. 28 tablet 0  . lamoTRIgine (LAMICTAL) 100 MG tablet TAKE ONE TABLET BY MOUTH ONCE DAILY FOR MOOD STABILIZATION. 90 tablet 0  . losartan (COZAAR) 100 MG tablet Take 1 tablet (100 mg total) by mouth daily. 90 tablet 1  . venlafaxine XR (EFFEXOR-XR) 75 MG 24 hr capsule TAKE 3 CAPSULES IN THE MORNING 270 capsule 0  . Dexlansoprazole (DEXILANT) 30 MG capsule Take 1 capsule (30 mg total) by mouth daily. (Patient not taking: Reported on 08/31/2019) 30 capsule 3   No facility-administered medications prior to visit.    Allergies  Allergen Reactions  . Clavulanic Acid Nausea And Vomiting  . Sanctura [Trospium] Other (See Comments)    Dizziness    ROS Review of Systems    Objective:    Physical Exam Constitutional:      Appearance: She is well-developed.  HENT:     Head: Normocephalic and atraumatic.  Cardiovascular:     Rate and Rhythm: Normal rate and regular rhythm.     Heart sounds: Normal heart sounds.  Pulmonary:     Effort: Pulmonary effort is normal.     Breath sounds: Normal breath sounds.  Skin:    General: Skin is warm and dry.  Neurological:     Mental Status: She is alert and oriented to person, place, and time.  Psychiatric:        Behavior: Behavior normal.     BP 125/67 (BP Location: Left Arm, Patient Position: Sitting)   Pulse 80   Wt 237 lb (107.5 kg)   LMP 06/24/2013   SpO2 97%   BMI 35.10 kg/m  Wt Readings from Last 3 Encounters:  08/31/19 237 lb (107.5 kg)  08/19/19 232 lb 11.2 oz (105.6 kg)  05/26/19 237 lb (107.5 kg)     Health Maintenance Due  Topic Date Due  . INFLUENZA  VACCINE  08/30/2019    There are no preventive care reminders to display for this patient.  Lab Results  Component Value Date   TSH 0.98 06/03/2019   Lab Results  Component Value Date   WBC 8.7 03/04/2018   HGB 15.1 03/04/2018   HCT 42.4 03/04/2018   MCV 87.6 03/04/2018   PLT 274 03/04/2018   Lab Results  Component Value Date   NA 138 06/03/2019   K 4.5 06/03/2019   CO2 26 06/03/2019   GLUCOSE 121 (H) 06/03/2019   BUN 17 06/03/2019   CREATININE 0.72 06/03/2019   BILITOT 0.4 06/03/2019   ALKPHOS 58 09/14/2016   AST 13 06/03/2019   ALT 18 06/03/2019   PROT 6.8 06/03/2019   ALBUMIN 4.0 09/14/2016   CALCIUM 9.4 06/03/2019   GFR 89 11/08/2011   Lab Results  Component Value Date   CHOL 188 06/03/2019   Lab Results  Component Value Date   HDL 34 (L) 06/03/2019   Lab Results  Component Value Date   LDLCALC 113 (H) 06/03/2019   Lab Results  Component Value Date   TRIG 285 (H) 06/03/2019   Lab Results  Component Value Date   CHOLHDL 5.5 (H) 06/03/2019   Lab Results  Component Value Date   HGBA1C 5.9 (A) 08/31/2019   HGBA1C 5.9 08/31/2019   HGBA1C 5.9 08/31/2019   HGBA1C 5.9 08/31/2019      Assessment & Plan:   Problem List Items Addressed This Visit      Cardiovascular and Mediastinum   Essential hypertension - Primary    Pressure looks great today.  Continue current regimen.  Follow-up in 6 months.        Endocrine   IFG (impaired fasting glucose)    Hemoglobin A1c slightly elevated at 5.9 today.  Discussed low-carb low sugar diet, increasing vegetables and working on eating lean protein.  Mialee and her partner are really working on trying to make some lifestyle changes discussed following up and planning to recheck levels again in 4 to 6 months.        Other   Elevated hemoglobin (HCC)   Relevant Orders   POCT HgB A1C (Completed)    Other Visit Diagnoses    Tick bite, subsequent encounter         Tick bite-overall symptoms are improving  significantly.  She still has a couple days left on the antibiotics encouraged her to make sure that she finishes that out.  Initial blood test for Lyme's and [facility] spotted fever are negative but we could consider repeating the test for Lyme's disease if she is not continuing to improve over the next several weeks.  Gust that since she has already had treatment it may not change our plan of care but just to be aware that it can be rechecked as it can oftentimes be falsely negative especially early on.  No orders of the defined types were placed in this encounter.   Follow-up: Return in about 6 months (around 03/02/2020) for Hypertension and A1C.       [name], MD

## 2019-08-31 NOTE — Assessment & Plan Note (Signed)
Pressure looks great today.  Continue current regimen.  Follow-up in 6 months.

## 2019-08-31 NOTE — Assessment & Plan Note (Signed)
Hemoglobin A1c slightly elevated at 5.9 today.  Discussed low-carb low sugar diet, increasing vegetables and working on eating lean protein.  Brooke Kaiser and her partner are really working on trying to make some lifestyle changes discussed following up and planning to recheck levels again in 4 to 6 months.

## 2019-09-18 ENCOUNTER — Other Ambulatory Visit (HOSPITAL_COMMUNITY): Payer: Self-pay

## 2019-09-18 MED ORDER — LAMOTRIGINE 100 MG PO TABS: ORAL_TABLET | ORAL | 0 refills | Status: DC

## 2019-12-10 ENCOUNTER — Ambulatory Visit: Payer: 59 | Admitting: Family Medicine

## 2019-12-10 ENCOUNTER — Telehealth (HOSPITAL_COMMUNITY): Payer: 59 | Admitting: Psychiatry

## 2019-12-13 ENCOUNTER — Other Ambulatory Visit (HOSPITAL_COMMUNITY): Payer: Self-pay | Admitting: Psychiatry

## 2019-12-14 ENCOUNTER — Other Ambulatory Visit: Payer: Self-pay | Admitting: Family Medicine

## 2019-12-21 ENCOUNTER — Ambulatory Visit: Payer: Self-pay | Admitting: Dermatology

## 2020-01-27 ENCOUNTER — Other Ambulatory Visit (HOSPITAL_COMMUNITY): Payer: Self-pay

## 2020-01-27 MED ORDER — VENLAFAXINE HCL ER 75 MG PO CP24
ORAL_CAPSULE | ORAL | 0 refills | Status: DC
Start: 2020-01-27 — End: 2020-04-26

## 2020-02-02 ENCOUNTER — Telehealth (HOSPITAL_COMMUNITY): Payer: 59 | Admitting: Psychiatry

## 2020-02-15 ENCOUNTER — Encounter (HOSPITAL_COMMUNITY): Payer: Self-pay | Admitting: Psychiatry

## 2020-02-15 ENCOUNTER — Telehealth (INDEPENDENT_AMBULATORY_CARE_PROVIDER_SITE_OTHER): Payer: 59 | Admitting: Psychiatry

## 2020-02-15 DIAGNOSIS — F331 Major depressive disorder, recurrent, moderate: Secondary | ICD-10-CM | POA: Diagnosis not present

## 2020-02-15 DIAGNOSIS — F411 Generalized anxiety disorder: Secondary | ICD-10-CM | POA: Diagnosis not present

## 2020-02-15 MED ORDER — LAMOTRIGINE 100 MG PO TABS
ORAL_TABLET | ORAL | 0 refills | Status: DC
Start: 2020-02-15 — End: 2020-08-24

## 2020-02-15 NOTE — Progress Notes (Signed)
Patient ID: Brooke Kaiser, female   DOB: May 18, 1970, 50 y.o.   MRN: [id number] [facility] MD Progress Note  02/15/2020 4:33 PM Brooke Kaiser  MRN:  [id number]   Virtual Visit via Video Note     I connected with Brooke Kaiser on 02/15/20 at  4:15 PM EST by telephone and verified that I am speaking with the correct person using two identifiers. I discussed the limitations of evaluation and management by telemedicine and the availability of in person appointments. The patient expressed understanding and agreed to proceed. Patient location : parked car Provider location Home office  Was doing fair, but mom has cancer spread so she is living with her,  Patient partner had to move out for privacy related work since mom is home Some strain but handling it Feels subdued at times but feels its the circumstances and mom's health and her personality   effexor helps anxiety Modifying factor: GF, job Aggravating factor: mom sickness Severity of depression: 7.5/10  No associated psychotic symptoms or mania.  Principal Problem: GAD and MDD Diagnosis:   Patient Active Problem List   Diagnosis Date Noted  . Elevated hemoglobin ([place]) [D58.2] 03/04/2018  . Vitamin D deficiency [E55.9] 03/04/2018  . Gouty arthritis [M10.9] 11/04/2013  . ANA positive [R76.8] 09/22/2013  . Muscle ache [M79.10] 09/22/2013  . Collagen disease ([place]) [M35.9] 09/22/2013  . Elevated erythrocyte sedimentation rate [R70.0] 09/22/2013  . IFG (impaired fasting glucose) [R73.01] 06/24/2012  . Asthmatic bronchitis [J45.909] 10/16/2011  . GAD (generalized anxiety disorder) [F41.1] 07/30/2011  . Essential hypertension [I10] 04/07/2010  . UNSPECIFIED ANEMIA [D64.9] 03/29/2010  . Severe episode of recurrent major depressive disorder ([place]) [F33.2] 07/22/2009  . OTHER ABNORMAL GLUCOSE [R73.09] 05/25/2008  . HYPERTRIGLYCERIDEMIA [E78.1] 11/04/2007  . FATIGUE [R53.81, R53.83] 11/04/2007  . EXTERNAL HEMORRHOIDS [K64.4] 07/22/2007  .  BACK PAIN [M54.9] 07/22/2007  . TOBACCO ABUSE [F17.200] 07/01/2007  . GERD [K21.9] 07/01/2007  . ABNORMAL THYROID FUNCTION TESTS [R94.6] 04/22/2007   Total Time spent with patient: 25 minutes   Past Medical History:  Past Medical History:  Diagnosis Date  . Abnormal thyroid blood test   . Anemia   . Anxiety   . Depression   . Hypertension   . Tobacco abuse     Past Surgical History:  Procedure Laterality Date  . 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 Cancer Mother 64       Breast  . Colon polyps Mother   . Other Mother        colon polyps  . Dementia Mother   . Anxiety disorder Mother   . Depression Mother   . Paranoid behavior Mother   . Depression Father   . Suicidality Father        commited suicide  . Alcohol abuse Father   . Colon polyps Maternal Aunt        cancerous colon polyps  . Cancer Maternal Uncle        cancerous colon polyps  . Heart disease Other   . Other Other        thyroid disorder  . Alcohol abuse Paternal Uncle   . Dementia Maternal Grandmother   . Alcohol abuse Paternal Grandmother   . Alcohol abuse Paternal Uncle    Social History:  Social History   Substance and Sexual Activity  Alcohol Use No  . Alcohol/week: 0.3  standard drinks     Social History   Substance and Sexual Activity  Drug Use No    Social History   Socioeconomic History  . Marital status: Single    Spouse name: Not on file  . Number of children: Not on file  . Years of education: Not on file  . Highest education level: Not on file  Occupational History  . 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    Employer: [employer]  Tobacco Use  . Smoking status: Current Every Day Smoker    Packs/day: 1.00    Years: 27.00    Pack years: 27.00    Types: Cigarettes  . Smokeless tobacco: Never Used  Vaping Use  . Vaping Use: Never used  Substance and Sexual Activity  . Alcohol use:  No    Alcohol/week: 0.3 standard drinks  . Drug use: No  . Sexual activity: Yes    Partners: Female    Comment: prefers female sex partner  Other Topics Concern  . Not on file  Social History Narrative  . Not on file   Social Determinants of Health   Financial Resource Strain: Not on file  Food Insecurity: Not on file  Transportation Needs: Not on file  Physical Activity: Not on file  Stress: Not on file  Social Connections: Not on file   Additional History:    Sleep: Fair  Appetite:  Fair   Assessment: GAD stable, MDD stable      Psychiatric Specialty Exam: Physical Exam  Review of Systems  Cardiovascular: Negative for chest pain.  Skin: Negative for rash.  Psychiatric/Behavioral: Positive for depression. Negative for hallucinations and suicidal ideas.    Last menstrual period 06/24/2013.There is no height or weight on file to calculate BMI.  General Appearance: Neat and Well Groomed  Eye Contact::  Good  Speech:  Clear and Coherent  Volume:  Normal  Mood: somewhat subdued  Affect:  congruent  Thought Process:  Goal Directed, Intact, Linear and Logical  Orientation:  Full (Time, Place, and Person)  Thought Content:  WDL  Suicidal Thoughts:  No  Homicidal Thoughts:  No  Memory:  Immediate;   Good Recent;   Good Remote;   Good  Judgement:  Good  Insight:  Present  Psychomotor Activity:  Normal  Concentration:  Good  Recall:  Good  Fund of Knowledge:Good  Language: Good  Akathisia:  No  Handed:  Right  AIMS (if indicated):     Assets:  Communication Skills Desire for Improvement Housing Intimacy Leisure Time Physical Health Resilience Social Support Talents/Skills Transportation Vocational/Educational  ADL's:  Intact  Cognition: WNL  Sleep:  Fair  Notes she is sleeping during the day.     Current Medications: Current Outpatient Prescriptions  Medication Sig Dispense Refill  . lamoTRIgine (LAMICTAL) 100 MG tablet Take one tablet at day  for mood stabilization. 30 tablet 1  . losartan-hydrochlorothiazide (HYZAAR) 100-25 MG per tablet Take 1 tablet by mouth daily. 30 tablet 5  . venlafaxine XR (EFFEXOR-XR) 75 MG 24 hr capsule Take 3 capsules (225 mg total) by mouth daily with breakfast. 90 capsule 0  . VENTOLIN HFA 108 (90 BASE) MCG/ACT inhaler INHALE 4 PUFFS INTO THE LUNGS 2 TIMES DAILY AS NEEDED. 18 Inhaler 3   No current facility-administered medications for this visit.    Lab Results:  No results found for this or any previous visit (from the past 48 hour(s)).  Physical Findings: AIMS: CIWA:   COWS:    Treatment Plan  Summary: Medication management   MDD :subdued but feels related to circumstances, continue lamictal and effexor, recommend therapy Mood disorder: subdued, see above ,recommend therapy GAD: fluctuates, continue effexor, add more ME time  I discussed the assessment and treatment plan with the patient. The patient was provided an opportunity to ask questions and all were answered. The patient agreed with the plan and demonstrated an understanding of the instructions.   The patient was advised to call back or seek an in-person evaluation if the symptoms worsen or if the condition fails to improve as anticipated.  I provided 15 minutes of virtual face to face time  Questions addressed FU 2- 69m also schedule therapy Merian Capron  4:33 PM 02/15/2020

## 2020-03-02 NOTE — Progress Notes (Signed)
Acute Office Visit  Subjective:    Patient ID: Brooke Kaiser, female    DOB: 05-23-1970, 50 y.o.   MRN: [id number]  Chief Complaint  Patient presents with  . Foot Pain    L foot x 1.5 wks denies any injury/trauma she has been alternating between IBU and tylenol for pain.     HPI Patient is in today for pain and swelling over the left distal foot x 1.5 weeks. Initially it was more painful.  She started taking ibuprofen and Tylenol and says now it is just painful if she crosses her toes and flexes them at the same time she can feel a little bit of a tenderness simple.  But is not painful with walking she denies any injury or trauma.  She also asked about getting in with a therapist or counselor.  She has had a lot of stressful things going on.  Her mom has moved in with her and she is not doing well she has metastatic cancer.  Her girlfriend actually moved out.  There staying together but she needed more space to be able to work from home.  But is still been somewhat stressful.  Past Medical History:  Diagnosis Date  . Abnormal thyroid blood test   . Anemia   . Anxiety   . Depression   . Hypertension   . Tobacco abuse     Past Surgical History:  Procedure Laterality Date  . 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 Cancer Mother 82       Breast  . Colon polyps Mother   . Other Mother        colon polyps  . Dementia Mother   . Anxiety disorder Mother   . Depression Mother   . Paranoid behavior Mother   . Depression Father   . Suicidality Father        commited suicide  . Alcohol abuse Father   . Colon polyps Maternal Aunt        cancerous colon polyps  . Cancer Maternal Uncle        cancerous colon polyps  . Heart disease Other   . Other Other        thyroid disorder  . Alcohol abuse Paternal Uncle   . Dementia Maternal Grandmother   . Alcohol abuse Paternal Grandmother   . Alcohol abuse  Paternal Uncle     Social History   Socioeconomic History  . Marital status: Single    Spouse name: Not on file  . Number of children: Not on file  . Years of education: Not on file  . Highest education level: Not on file  Occupational History  . Occupation: [place]    Employer: [employer]  Tobacco Use  . Smoking status: Current Every Day Smoker    Packs/day: 1.00    Years: 27.00    Pack years: 27.00    Types: Cigarettes  . Smokeless tobacco: Never Used  Vaping Use  . Vaping Use: Never used  Substance and Sexual Activity  . Alcohol use: No    Alcohol/week: 0.3 standard drinks  . Drug use: No  . Sexual activity: Yes    Partners: Female    Comment: prefers female sex partner  Other Topics Concern  . Not on file  Social History Narrative  . Not on file   Social Determinants of Health  Financial Resource Strain: Not on file  Food Insecurity: Not on file  Transportation Needs: Not on file  Physical Activity: Not on file  Stress: Not on file  Social Connections: Not on file  Intimate Partner Violence: Not on file    Outpatient Medications Prior to Visit  Medication Sig Dispense Refill  . albuterol (VENTOLIN HFA) 108 (90 Base) MCG/ACT inhaler INHALE 2-4 PUFFS INTO THE LUNGS 2 TIMES DAILY AS NEEDED. 18 g prn  . lamoTRIgine (LAMICTAL) 100 MG tablet One a day 90 tablet 0  . losartan (COZAAR) 100 MG tablet TAKE 1 TABLET BY MOUTH EVERY DAY 90 tablet 1  . venlafaxine XR (EFFEXOR-XR) 75 MG 24 hr capsule Take 3 Capsules in the morning. 270 capsule 0  . Dexlansoprazole (DEXILANT) 30 MG capsule Take 1 capsule (30 mg total) by mouth daily. (Patient not taking: Reported on 08/31/2019) 30 capsule 3   No facility-administered medications prior to visit.    Allergies  Allergen Reactions  . Clavulanic Acid Nausea And Vomiting  . Sanctura [Trospium] Other (See Comments)    Dizziness    Review of Systems     Objective:    Physical Exam Vitals reviewed.   Constitutional:      Appearance: She is well-developed and well-nourished.  HENT:     Head: Normocephalic and atraumatic.  Eyes:     Extraocular Movements: EOM normal.     Conjunctiva/sclera: Conjunctivae normal.  Cardiovascular:     Rate and Rhythm: Normal rate.  Pulmonary:     Effort: Pulmonary effort is normal.  Skin:    General: Skin is dry.     Coloration: Skin is not pale.     Comments: Top of left foot over the 2nd MT head there is some visible swelling. No induration. It has a bruised appearance as well. Foot and toes with NROM.   Neurological:     Mental Status: She is alert and oriented to person, place, and time.  Psychiatric:        Mood and Affect: Mood and affect normal.        Behavior: Behavior normal.     BP 115/71   Pulse 82   Ht 5\' 9"  (1.753 m)   Wt 241 lb (109.3 kg)   LMP 06/24/2013   SpO2 99%   BMI 35.59 kg/m  Wt Readings from Last 3 Encounters:  03/03/20 241 lb (109.3 kg)  08/31/19 237 lb (107.5 kg)  08/19/19 232 lb 11.2 oz (105.6 kg)    Health Maintenance Due  Topic Date Due  . COLONOSCOPY (Pts 45-28yrs Insurance coverage will need to be confirmed)  Never done  . INFLUENZA VACCINE  08/30/2019    There are no preventive care reminders to display for this patient.   Lab Results  Component Value Date   TSH 0.98 06/03/2019   Lab Results  Component Value Date   WBC 8.7 03/04/2018   HGB 15.1 03/04/2018   HCT 42.4 03/04/2018   MCV 87.6 03/04/2018   PLT 274 03/04/2018   Lab Results  Component Value Date   NA 138 06/03/2019   K 4.5 06/03/2019   CO2 26 06/03/2019   GLUCOSE 121 (H) 06/03/2019   BUN 17 06/03/2019   CREATININE 0.72 06/03/2019   BILITOT 0.4 06/03/2019   ALKPHOS 58 09/14/2016   AST 13 06/03/2019   ALT 18 06/03/2019   PROT 6.8 06/03/2019   ALBUMIN 4.0 09/14/2016   CALCIUM 9.4 06/03/2019   GFR 89 11/08/2011   Lab  Results  Component Value Date   CHOL 188 06/03/2019   Lab Results  Component Value Date   HDL 34 (L)  06/03/2019   Lab Results  Component Value Date   LDLCALC 113 (H) 06/03/2019   Lab Results  Component Value Date   TRIG 285 (H) 06/03/2019   Lab Results  Component Value Date   CHOLHDL 5.5 (H) 06/03/2019   Lab Results  Component Value Date   HGBA1C 5.9 (A) 08/31/2019   HGBA1C 5.9 08/31/2019   HGBA1C 5.9 08/31/2019   HGBA1C 5.9 08/31/2019       Assessment & Plan:   Problem List Items Addressed This Visit   None   Visit Diagnoses    Stress    -  Primary   Relevant Orders   Ambulatory referral to Behavioral Health   Left foot pain         Stress -I do think she would do really well with some therapy/counseling.  Referral placed today to get her in with Juliann Pulse in our office she is okay with virtual appointments.  Left foot pain -certainly may have started as a gout flare she did her ibuprofen and now is feeling better that is definitely possible we can always consider checking a uric acid level.  In the meantime I do not see anything that indicates fracture she is only mildly tender on exam and she is able to walk without any pain or difficulty.  Certainly if it is not improving we can do further work-up and x-ray but will see if it resolves with conservative care for the next 2 weeks.  Discussed need for colon cancer screening.  She is okay with colonoscopy.  She is going to check to see who her mom saw she really liked him.  If she needs a referral she will reach out to [place].  No orders of the defined types were placed in this encounter.    [name], MD

## 2020-03-03 ENCOUNTER — Other Ambulatory Visit: Payer: Self-pay

## 2020-03-03 ENCOUNTER — Ambulatory Visit: Payer: 59 | Admitting: Family Medicine

## 2020-03-03 ENCOUNTER — Encounter: Payer: Self-pay | Admitting: Family Medicine

## 2020-03-03 VITALS — BP 115/71 | HR 82 | Ht 69.0 in | Wt 241.0 lb

## 2020-03-03 DIAGNOSIS — M79672 Pain in left foot: Secondary | ICD-10-CM

## 2020-03-03 DIAGNOSIS — F439 Reaction to severe stress, unspecified: Secondary | ICD-10-CM | POA: Diagnosis not present

## 2020-03-03 NOTE — Progress Notes (Signed)
L foot x 1.5 wks denies any injury/trauma she has been alternating between IBU and tylenol for pain. Pt reports that she is having a gout flare. The area is on the dorsal area of her L foot right below the 2nd/3rd toes. The area is slightly raised and bruised and tender to touch.  She stated that she recently went back to work a few days a week and thinks that this may have aggravated the area on the top of her foot more.

## 2020-04-26 ENCOUNTER — Other Ambulatory Visit (HOSPITAL_COMMUNITY): Payer: Self-pay | Admitting: Psychiatry

## 2020-05-03 ENCOUNTER — Ambulatory Visit: Payer: 59 | Admitting: Family Medicine

## 2020-05-16 ENCOUNTER — Other Ambulatory Visit: Payer: Self-pay | Admitting: Family Medicine

## 2020-05-16 ENCOUNTER — Telehealth (HOSPITAL_COMMUNITY): Payer: 59 | Admitting: Psychiatry

## 2020-05-26 ENCOUNTER — Ambulatory Visit (INDEPENDENT_AMBULATORY_CARE_PROVIDER_SITE_OTHER): Payer: 59 | Admitting: Psychology

## 2020-05-26 DIAGNOSIS — F330 Major depressive disorder, recurrent, mild: Secondary | ICD-10-CM | POA: Diagnosis not present

## 2020-05-26 DIAGNOSIS — F411 Generalized anxiety disorder: Secondary | ICD-10-CM

## 2020-06-01 ENCOUNTER — Ambulatory Visit (INDEPENDENT_AMBULATORY_CARE_PROVIDER_SITE_OTHER): Payer: 59 | Admitting: Psychology

## 2020-06-01 DIAGNOSIS — F411 Generalized anxiety disorder: Secondary | ICD-10-CM

## 2020-06-09 ENCOUNTER — Ambulatory Visit: Payer: 59 | Admitting: Psychology

## 2020-06-10 ENCOUNTER — Other Ambulatory Visit: Payer: Self-pay | Admitting: Family Medicine

## 2020-06-15 ENCOUNTER — Encounter: Payer: Self-pay | Admitting: Medical-Surgical

## 2020-06-15 ENCOUNTER — Telehealth: Payer: 59 | Admitting: Medical-Surgical

## 2020-06-15 DIAGNOSIS — L03311 Cellulitis of abdominal wall: Secondary | ICD-10-CM | POA: Diagnosis not present

## 2020-06-15 DIAGNOSIS — W57XXXA Bitten or stung by nonvenomous insect and other nonvenomous arthropods, initial encounter: Secondary | ICD-10-CM | POA: Diagnosis not present

## 2020-06-15 MED ORDER — DOXYCYCLINE HYCLATE 100 MG PO TABS: 100.0000 mg | ORAL_TABLET | Freq: Two times a day (BID) | ORAL | 0 refills | Status: AC

## 2020-06-15 NOTE — Progress Notes (Signed)
Virtual Visit via Video Note  I connected with Brooke Kaiser on 06/15/20 at  4:00 PM EDT by a video enabled telemedicine application and verified that I am speaking with the correct person using two identifiers.   I discussed the limitations of evaluation and management by telemedicine and the availability of in person appointments. The patient expressed understanding and agreed to proceed.  Patient location: home Provider locations: office  Subjective:    CC: Tick bite  HPI: Pleasant 50 year old female presenting via MyChart video visit with reports of having a tick bite on her abdomen.  She found a tick on Sunday afternoon after doing yard work throughout the day.  She applied Vicks VapoRub to the bite/tick and it successfully detached.  Estimates the maximum amount of time that the tick was attached would be around 5 hours.  Since then, she has noticed some redness and swelling at the site.  Has not had any fevers, chills, drainage, or myalgias.   Past medical history, Surgical history, Family history not pertinant except as noted below, Social history, Allergies, and medications have been entered into the medical record, reviewed, and corrections made.   Review of Systems: See HPI for pertinent positives and negatives.   Objective:    General: Speaking clearly in complete sentences without any shortness of breath.  Alert and oriented x3.  Normal judgment. No apparent acute distress.  Site evaluated using the cell phone camera during the video visit.  The picture was grainy but there is definitely erythema and swelling surrounding the area that was bitten.  Impression and Recommendations:    1. Cellulitis of abdominal wall 2. Tick bite, initial encounter Suspicion for Lyme disease low considering the tick was only attached for 5 hours or less but the erythema and swelling of the area are consistent with cellulitis.  Since the bite was caused by a tick, will go ahead and treat with  doxycycline 100 mg twice daily x7 days.  Advised to monitor the site for worsening erythema.  Also monitor for fever, chills, myalgias, unusual fatigue, and purulent drainage.  I discussed the assessment and treatment plan with the patient. The patient was provided an opportunity to ask questions and all were answered. The patient agreed with the plan and demonstrated an understanding of the instructions.   The patient was advised to call back or seek an in-person evaluation if the symptoms worsen or if the condition fails to improve as anticipated.  20 minutes of non-face-to-face time was provided during this encounter.  Return if symptoms worsen or fail to improve.  Clearnce Sorrel, DNP, APRN, FNP-BC [facility]

## 2020-06-17 ENCOUNTER — Ambulatory Visit (INDEPENDENT_AMBULATORY_CARE_PROVIDER_SITE_OTHER): Payer: 59 | Admitting: Psychology

## 2020-06-17 DIAGNOSIS — F411 Generalized anxiety disorder: Secondary | ICD-10-CM

## 2020-06-23 ENCOUNTER — Ambulatory Visit: Payer: 59 | Admitting: Psychology

## 2020-06-29 ENCOUNTER — Other Ambulatory Visit: Payer: Self-pay | Admitting: Family Medicine

## 2020-06-29 ENCOUNTER — Other Ambulatory Visit: Payer: Self-pay

## 2020-06-29 ENCOUNTER — Encounter: Payer: Self-pay | Admitting: Family Medicine

## 2020-06-29 ENCOUNTER — Ambulatory Visit: Payer: 59 | Admitting: Psychology

## 2020-06-29 ENCOUNTER — Ambulatory Visit (INDEPENDENT_AMBULATORY_CARE_PROVIDER_SITE_OTHER): Payer: 59

## 2020-06-29 ENCOUNTER — Ambulatory Visit (INDEPENDENT_AMBULATORY_CARE_PROVIDER_SITE_OTHER): Payer: 59 | Admitting: Family Medicine

## 2020-06-29 VITALS — BP 137/62 | HR 78 | Ht 69.0 in | Wt 253.0 lb

## 2020-06-29 DIAGNOSIS — M65331 Trigger finger, right middle finger: Secondary | ICD-10-CM

## 2020-06-29 DIAGNOSIS — R7301 Impaired fasting glucose: Secondary | ICD-10-CM

## 2020-06-29 DIAGNOSIS — M25541 Pain in joints of right hand: Secondary | ICD-10-CM | POA: Diagnosis not present

## 2020-06-29 DIAGNOSIS — M25562 Pain in left knee: Secondary | ICD-10-CM

## 2020-06-29 DIAGNOSIS — M79641 Pain in right hand: Secondary | ICD-10-CM

## 2020-06-29 DIAGNOSIS — M79642 Pain in left hand: Secondary | ICD-10-CM

## 2020-06-29 DIAGNOSIS — I10 Essential (primary) hypertension: Secondary | ICD-10-CM

## 2020-06-29 DIAGNOSIS — M25542 Pain in joints of left hand: Secondary | ICD-10-CM

## 2020-06-29 DIAGNOSIS — Z1231 Encounter for screening mammogram for malignant neoplasm of breast: Secondary | ICD-10-CM

## 2020-06-29 MED ORDER — LOSARTAN POTASSIUM 100 MG PO TABS: 1.0000 | ORAL_TABLET | Freq: Every day | ORAL | 1 refills | Status: DC

## 2020-06-29 NOTE — Progress Notes (Signed)
Established Patient Office Visit  Subjective:  Patient ID: Brooke Kaiser, female    DOB: [birth year]  Age: 50 y.o. MRN: [id number]  CC:  Chief Complaint  Patient presents with  . Knee Pain    HPI Brooke Kaiser presents for Joint and knee pain, bilateral.   Bilat hand pain x 6 mo. Says they lock up when she coughs or grips things.  Says she will massage her hands to help it. Can seem to happen at any time.  Uses IBU or Tylenol nightly.  Pain mostly over the PIP of middle finger on the right hand.  Denies any significant swelling or redness of the joints.  C/O Left knee pain - has gained some weight.  She says it has been bothering her for years but has been worse lately. It has been swelling more often. Says it feels like it wants to give out at time.     Past Medical History:  Diagnosis Date  . Abnormal thyroid blood test   . Anemia   . Anxiety   . Depression   . Hypertension   . Tobacco abuse     Past Surgical History:  Procedure Laterality Date  . 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 Cancer Mother 78       Breast  . Colon polyps Mother   . Other Mother        colon polyps  . Dementia Mother   . Anxiety disorder Mother   . Depression Mother   . Paranoid behavior Mother   . Depression Father   . Suicidality Father        commited suicide  . Alcohol abuse Father   . Colon polyps Maternal Aunt        cancerous colon polyps  . Cancer Maternal Uncle        cancerous colon polyps  . Heart disease Other   . Other Other        thyroid disorder  . Alcohol abuse Paternal Uncle   . Dementia Maternal Grandmother   . Alcohol abuse Paternal Grandmother   . Alcohol abuse Paternal Uncle     Social History   Socioeconomic History  . Marital status: Single    Spouse name: Not on file  . Number of children: Not on file  . Years of education: Not on file  . Highest education level: Not on  file  Occupational History  . Occupation: [employer]    Employer: [employer]  Tobacco Use  . Smoking status: Current Every Day Smoker    Packs/day: 1.00    Years: 27.00    Pack years: 27.00    Types: Cigarettes  . Smokeless tobacco: Never Used  Vaping Use  . Vaping Use: Never used  Substance and Sexual Activity  . Alcohol use: No    Alcohol/week: 0.3 standard drinks  . Drug use: No  . Sexual activity: Yes    Partners: Female    Comment: prefers female sex partner  Other Topics Concern  . Not on file  Social History Narrative  . Not on file   Social Determinants of Health   Financial Resource Strain: Not on file  Food Insecurity: Not on file  Transportation Needs: Not on file  Physical Activity: Not on file  Stress: Not on file  Social Connections: Not on file  Intimate Partner Violence: Not  on file    Outpatient Medications Prior to Visit  Medication Sig Dispense Refill  . albuterol (VENTOLIN HFA) 108 (90 Base) MCG/ACT inhaler INHALE 2-4 PUFFS INTO THE LUNGS 2 TIMES DAILY AS NEEDED. 18 g prn  . lamoTRIgine (LAMICTAL) 100 MG tablet One a day 90 tablet 0  . venlafaxine XR (EFFEXOR-XR) 75 MG 24 hr capsule TAKE 3 CAPSULES IN THE MORNING. 270 capsule 0  . losartan (COZAAR) 100 MG tablet TAKE 1 TABLET (100 MG TOTAL) BY MOUTH DAILY. PLEASE SCHEDULE A FOLLOW UP APPOINTMENT. 30 tablet 0   No facility-administered medications prior to visit.    Allergies  Allergen Reactions  . Clavulanic Acid Nausea And Vomiting  . Sanctura [Trospium] Other (See Comments)    Dizziness    ROS Review of Systems    Objective:    Physical Exam Musculoskeletal:     Comments: Able to recreate triggering of middle finger on right hand.  No evidence of contractures.  Left knee with normal range of motion.  Some clicking with extension but no painful popping.  Nontender over the patellar tendon.  Some mild tenderness over the joint lines.  No discrete swelling today.  No increased  laxity.     BP 137/62   Pulse 78   Ht 5\' 9"  (1.753 m)   Wt 253 lb (114.8 kg)   LMP 06/24/2013   SpO2 96%   BMI 37.36 kg/m  Wt Readings from Last 3 Encounters:  06/29/20 253 lb (114.8 kg)  03/03/20 241 lb (109.3 kg)  08/31/19 237 lb (107.5 kg)     Health Maintenance Due  Topic Date Due  . COLONOSCOPY (Pts 45-73yrs Insurance coverage will need to be confirmed)  Never done  . Zoster Vaccines- Shingrix (1 of 2) Never done    There are no preventive care reminders to display for this patient.  Lab Results  Component Value Date   TSH 0.98 06/03/2019   Lab Results  Component Value Date   WBC 8.7 03/04/2018   HGB 15.1 03/04/2018   HCT 42.4 03/04/2018   MCV 87.6 03/04/2018   PLT 274 03/04/2018   Lab Results  Component Value Date   NA 138 06/03/2019   K 4.5 06/03/2019   CO2 26 06/03/2019   GLUCOSE 121 (H) 06/03/2019   BUN 17 06/03/2019   CREATININE 0.72 06/03/2019   BILITOT 0.4 06/03/2019   ALKPHOS 58 09/14/2016   AST 13 06/03/2019   ALT 18 06/03/2019   PROT 6.8 06/03/2019   ALBUMIN 4.0 09/14/2016   CALCIUM 9.4 06/03/2019   GFR 89 11/08/2011   Lab Results  Component Value Date   CHOL 188 06/03/2019   Lab Results  Component Value Date   HDL 34 (L) 06/03/2019   Lab Results  Component Value Date   LDLCALC 113 (H) 06/03/2019   Lab Results  Component Value Date   TRIG 285 (H) 06/03/2019   Lab Results  Component Value Date   CHOLHDL 5.5 (H) 06/03/2019   Lab Results  Component Value Date   HGBA1C 5.9 (A) 08/31/2019   HGBA1C 5.9 08/31/2019   HGBA1C 5.9 08/31/2019   HGBA1C 5.9 08/31/2019      Assessment & Plan:   Problem List Items Addressed This Visit      Cardiovascular and Mediastinum   Essential hypertension    BP borderline today.       Relevant Medications   losartan (COZAAR) 100 MG tablet     Endocrine   IFG (impaired fasting  glucose)    Overdue for A1C tody.  Discussed getting back on track with healthy diet and weight loss.  She  said she did not even realize how much weight she had gained.       Other Visit Diagnoses    Arthralgia of both hands    -  Primary   Relevant Orders   COMPLETE METABOLIC PANEL WITH GFR   CBC   Hemoglobin A1c   Sedimentation rate   C-reactive protein   Pain in both hands       Relevant Orders   COMPLETE METABOLIC PANEL WITH GFR   CBC   Hemoglobin A1c   Sedimentation rate   C-reactive protein   Acute pain of left knee       Relevant Orders   DG Knee Complete 4 Views Left   Ambulatory referral to Sports Medicine   Trigger middle finger of right hand       Relevant Orders   Ambulatory referral to Sports Medicine     Trigger finger on her right middle finger-discussed diagnosis discussed treatment is oftenwith injection.  Response is usually pretty good.  For the pain in her hand suspect this is more related to regular arthritis she denies any significant swelling of the joints but I will at least check a sed rate and a CRP.  Left knee pain-suspect arthritis as well.  May also have a meniscal tear if she is also been having recurrent swelling.  We will start with a plain film x-ray.  She would probably benefit from some home PT which we will start with for patellofemoral syndrome as well. If not proving will plan to refer to sports medicine or Ortho.  Meds ordered this encounter  Medications  . losartan (COZAAR) 100 MG tablet    Sig: Take 1 tablet (100 mg total) by mouth daily.    Dispense:  90 tablet    Refill:  1    Follow-up: Return if symptoms worsen or fail to improve.    [name], MD

## 2020-06-29 NOTE — Assessment & Plan Note (Signed)
Overdue for A1C tody.  Discussed getting back on track with healthy diet and weight loss.  She said she did not even realize how much weight she had gained.

## 2020-06-29 NOTE — Assessment & Plan Note (Signed)
BP borderline today.   

## 2020-06-30 LAB — COMPLETE METABOLIC PANEL WITH GFR
AG Ratio: 1.9 (calc) (ref 1.0–2.5)
ALT: 24 U/L (ref 6–29)
AST: 17 U/L (ref 10–35)
Albumin: 4.4 g/dL (ref 3.6–5.1)
Alkaline phosphatase (APISO): 53 U/L (ref 37–153)
BUN: 16 mg/dL (ref 7–25)
CO2: 25 mmol/L (ref 20–32)
Calcium: 9.3 mg/dL (ref 8.6–10.4)
Chloride: 105 mmol/L (ref 98–110)
Creat: 0.7 mg/dL (ref 0.50–1.05)
GFR, Est African American: 117 mL/min/{1.73_m2} (ref >=60)
GFR, Est Non African American: 101 mL/min/{1.73_m2} (ref >=60)
Globulin: 2.3 g/dL (calc) (ref 1.9–3.7)
Glucose, Bld: 125 mg/dL — ABNORMAL HIGH (ref 65–99)
Potassium: 4.6 mmol/L (ref 3.5–5.3)
Sodium: 138 mmol/L (ref 135–146)
Total Bilirubin: 0.5 mg/dL (ref 0.2–1.2)
Total Protein: 6.7 g/dL (ref 6.1–8.1)

## 2020-06-30 LAB — CBC
HCT: 42.4 % (ref 35.0–45.0)
Hemoglobin: 14.7 g/dL (ref 11.7–15.5)
MCH: 31.2 pg (ref 27.0–33.0)
MCHC: 34.7 g/dL (ref 32.0–36.0)
MCV: 90 fL (ref 80.0–100.0)
MPV: 9.3 fL (ref 7.5–12.5)
Platelets: 249 10*3/uL (ref 140–400)
RBC: 4.71 10*6/uL (ref 3.80–5.10)
RDW: 13.1 % (ref 11.0–15.0)
WBC: 7.3 10*3/uL (ref 3.8–10.8)

## 2020-06-30 LAB — HEMOGLOBIN A1C
Hgb A1c MFr Bld: 6.3 % of total Hgb — ABNORMAL HIGH (ref <5.7)
Mean Plasma Glucose: 134 mg/dL
eAG (mmol/L): 7.4 mmol/L

## 2020-06-30 LAB — SEDIMENTATION RATE: Sed Rate: 9 mm/h (ref 0–20)

## 2020-06-30 LAB — C-REACTIVE PROTEIN: CRP: 2.8 mg/L (ref <8.0)

## 2020-07-04 ENCOUNTER — Encounter: Payer: Self-pay | Admitting: Family Medicine

## 2020-07-13 ENCOUNTER — Ambulatory Visit (INDEPENDENT_AMBULATORY_CARE_PROVIDER_SITE_OTHER): Payer: 59 | Admitting: Psychology

## 2020-07-13 DIAGNOSIS — F330 Major depressive disorder, recurrent, mild: Secondary | ICD-10-CM

## 2020-07-13 DIAGNOSIS — F411 Generalized anxiety disorder: Secondary | ICD-10-CM | POA: Diagnosis not present

## 2020-07-27 ENCOUNTER — Ambulatory Visit (INDEPENDENT_AMBULATORY_CARE_PROVIDER_SITE_OTHER): Payer: 59 | Admitting: Psychology

## 2020-07-27 DIAGNOSIS — F330 Major depressive disorder, recurrent, mild: Secondary | ICD-10-CM | POA: Diagnosis not present

## 2020-07-27 DIAGNOSIS — F411 Generalized anxiety disorder: Secondary | ICD-10-CM | POA: Diagnosis not present

## 2020-08-02 NOTE — Progress Notes (Signed)
I, Brooke Kaiser, LAT, ATC acting as a scribe for Brooke Leader, MD.  Subjective:    I'm seeing this patient as a consultation for: Brooke Kaiser. Note will be routed back to referring provider/PCP.  CC: Left knee pain and bilat hand pain   HPI: Pt is a 50 y/o female c/o bilat hand pain ongoing for 7 months, mostly in the PIP joint of her R 3rd finger. Pt notes when she is trying to grab onto things it will get stuck in flexion. Pt is R hand dominate. Pt digs up gas lines for work.  Aggravates: gripping things Treatments tried: massaging hands, IBU, Tylenol  Additionally, pt c/o L knee pain ongoing for years. Pt locates pain to the anterior aspect, but will hurt all over at times. Pt notes that sometimes knee will feel "tight."  L knee swelling: yes Mechanical symptoms: yes Weakness: yes- feels like knee is going to "give out" at times Aggravates: kneeling, standing, walking, squatting Treatments tried: brace, stretching  Dx testing: 06/29/20 L knee XR 06/29/20 Labs (CRP, sed rate, Hgb A1c, CBC, Complete metabolic panel)  Past medical history, Surgical history, Family history, Social history, Allergies, and medications have been entered into the medical record, reviewed.   Review of Systems: No new headache, visual changes, nausea, vomiting, diarrhea, constipation, dizziness, abdominal pain, skin rash, fevers, chills, night sweats, weight loss, swollen lymph nodes, body aches, joint swelling, muscle aches, chest pain, shortness of breath, mood changes, visual or auditory hallucinations.   Objective:    Vitals:   08/03/20 1240  BP: 126/78  Pulse: 81  SpO2: 98%   General: Well Developed, well nourished, and in no acute distress.  Neuro/Psych: Alert and oriented x3, extra-ocular muscles intact, able to move all 4 extremities, sensation grossly intact. Skin: Warm and dry, no rashes noted.  Respiratory: Not using accessory muscles, speaking in full sentences, trachea midline.   Cardiovascular: Pulses palpable, no extremity edema. Abdomen: Does not appear distended. MSK: Right hand normal-appearing Normal motion third digit triggering stuck in flexion at PIP joint. Tender palpation MCP palmar aspect. Intact strength. Pulses cap refill and sensation are intact distally.  Left knee mild effusion normal motion with crepitation.  Tender palpation medial joint line. Stable ligamentous exam.  Lab and Radiology Results  EXAM: LEFT KNEE - COMPLETE 4+ VIEW   COMPARISON:  None   FINDINGS: Joint space narrowing at medial compartment and patellofemoral joint.   Osseous mineralization normal.   No fracture, dislocation, or bone destruction.   No joint effusion.   Minimal soft tissue swelling anterior to the inferior pole of the patella extending anterior to the patellar tendon.   IMPRESSION: Mild degenerative changes and anterior soft tissue swelling.   No acute osseous abnormalities.     Electronically Signed   By: Brooke Kaiser M.D.   On: 06/29/2020 20:12 I, Brooke Kaiser, personally (independently) visualized and performed the interpretation of the images attached in this note.   Procedure: Real-time Ultrasound Guided Injection of right hand third digit MCP A1 pulley palmar aspect (trigger finger injection) Device: Philips Affiniti 50G Images permanently stored and available for review in PACS Verbal informed consent obtained.  Discussed risks and benefits of procedure. Warned about infection bleeding damage to structures skin hypopigmentation and fat atrophy among others. Patient expresses understanding and agreement Time-out conducted.   Noted no overlying erythema, induration, or other signs of local infection.   Skin prepped in a sterile fashion.   Local anesthesia: Topical Ethyl chloride.  With sterile technique and under real time ultrasound guidance: 40 mg of Kenalog and 1 mL of lidocaine injected into A1 pulley. Fluid seen entering the tendon  sheath.   Completed without difficulty   Pain immediately resolved suggesting accurate placement of the medication.   Advised to call if fevers/chills, erythema, induration, drainage, or persistent bleeding.   Images permanently stored and available for review in the ultrasound unit.  Impression: Technically successful ultrasound guided injection.      Impression and Recommendations:    Assessment and Plan: 50 y.o. female with right hand third digit trigger finger.  This has been ongoing for approximately 5 months maybe longer.  Plan for injection today and double Band-Aid splint.  Additionally recommend Voltaren gel.  Recheck as needed.  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  Plan for Voltaren gel and compression sleeve.  Recheck for injection if not improving.  Would like to avoid double injection today given A1c 6.3 when checked in June.  She is at risk for hyperglycemia with double injections.Marland Kitchen  PDMP not reviewed this encounter. Orders Placed This Encounter  Procedures   [place] LIMITED JOINT SPACE STRUCTURES LOW LEFT(NO LINKED CHARGES)    Standing Status:   Future    Number of Occurrences:   1    Standing Expiration Date:   02/03/2021    Order Specific Question:   Reason for Exam (SYMPTOM  OR DIAGNOSIS REQUIRED)    Answer:   left knee pain    Order Specific Question:   Preferred imaging location?    Answer:   [place]   No orders of the defined types were placed in this encounter.   Discussed warning signs or symptoms. Please see discharge instructions. Patient expresses understanding.   The above documentation has been reviewed and is accurate and complete Brooke Kaiser, M.D.

## 2020-08-03 ENCOUNTER — Ambulatory Visit: Payer: Self-pay

## 2020-08-03 ENCOUNTER — Ambulatory Visit: Payer: 59 | Admitting: Family Medicine

## 2020-08-03 ENCOUNTER — Ambulatory Visit: Payer: 59 | Admitting: Psychology

## 2020-08-03 ENCOUNTER — Other Ambulatory Visit: Payer: Self-pay

## 2020-08-03 ENCOUNTER — Ambulatory Visit (INDEPENDENT_AMBULATORY_CARE_PROVIDER_SITE_OTHER): Payer: 59

## 2020-08-03 VITALS — BP 126/78 | HR 81 | Ht 69.0 in | Wt 250.0 lb

## 2020-08-03 DIAGNOSIS — M25562 Pain in left knee: Secondary | ICD-10-CM | POA: Diagnosis not present

## 2020-08-03 DIAGNOSIS — M65331 Trigger finger, right middle finger: Secondary | ICD-10-CM | POA: Insufficient documentation

## 2020-08-03 DIAGNOSIS — Z1231 Encounter for screening mammogram for malignant neoplasm of breast: Secondary | ICD-10-CM | POA: Diagnosis not present

## 2020-08-03 NOTE — Patient Instructions (Signed)
Thank you for coming in today.   Please use Voltaren gel (Generic Diclofenac Gel) up to 4x daily for pain as needed.  This is available over-the-counter as both the name brand Voltaren gel and the generic diclofenac gel.   Use the double bandiad splint on the finger.   If this comes right back I could the injection again but a better option may be to consider surgery.   Repeat injection in the future if needed.    Trigger Finger  Trigger finger, also called stenosing tenosynovitis,  is a condition that causes a finger to get stuck in a bent position. Each finger has a tendon, which is a tough, cord-like tissue that connects muscle tobone, and each tendon passes through a tunnel of tissue called a tendon sheath. To move your finger, your tendon needs to glide freely through the sheath. Trigger finger happens when the tendon or the sheath thickens, making it difficult to move your finger. Trigger finger can affect any finger or a thumb. It may affect more than one finger. Mild cases may clear up with rest andmedicine. Severe cases require more treatment. What are the causes? Trigger finger is caused by a thickened finger tendon or tendon sheath. Thecause of this thickening is not known. What increases the risk? The following factors may make you more likely to develop this condition: Doing activities that require a strong grip. Having rheumatoid arthritis, gout, or diabetes. Being 16-45 years old. Being female. What are the signs or symptoms? Symptoms of this condition include: Pain when bending or straightening your finger. Tenderness or swelling where your finger attaches to the palm of your hand. A lump in the palm of your hand or on the inside of your finger. Hearing a noise like a pop or a snap when you try to straighten your finger. Feeling a catching or locking sensation when you try to straighten your finger. Being unable to straighten your finger. How is this diagnosed? This  condition is diagnosed based on your symptoms and a physical exam. How is this treated? This condition may be treated by: Resting your finger and avoiding activities that make symptoms worse. Wearing a finger splint to keep your finger extended. Taking NSAIDs, such as ibuprofen, to relieve pain and swelling. Doing gentle exercises to stretch the finger as told by your health care provider. Having medicine that reduces swelling and inflammation (steroids) injected into the tendon sheath. Injections may need to be repeated. Having surgery to open the tendon sheath. This may be done if other treatments do not work and you cannot straighten your finger. You may need physical therapy after surgery. Follow these instructions at home: If you have a splint: Wear the splint as told by your health care provider. Remove it only as told by your health care provider. Loosen it if your fingers tingle, become numb, or turn cold and blue. Keep it clean. If the splint is not waterproof: Do not let it get wet. Cover it with a watertight covering when you take a bath or shower. Managing pain, stiffness, and swelling     If directed, apply heat to the affected area as often as told by your health care provider. Use the heat source that your health care provider recommends, such as a moist heat pack or a heating pad. Place a towel between your skin and the heat source. Leave the heat on for 20-30 minutes. Remove the heat if your skin turns bright red. This is especially important if  you are unable to feel pain, heat, or cold. You may have a greater risk of getting burned. If directed, put ice on the painful area. To do this: If you have a removable splint, remove it as told by your health care provider. Put ice in a plastic bag. Place a towel between your skin and the bag or between your splint and the bag. Leave the ice on for 20 minutes, 2-3 times a day.  Activity Rest your finger as told by your health  care provider. Avoid activities that make the pain worse. Return to your normal activities as told by your health care provider. Ask your health care provider what activities are safe for you. Do exercises as told by your health care provider. Ask your health care provider when it is safe to drive if you have a splint on your hand. General instructions Take over-the-counter and prescription medicines only as told by your health care provider. Keep all follow-up visits as told by your health care provider. This is important. Contact a health care provider if: Your symptoms are not improving with home care. Summary Trigger finger, also called stenosing tenosynovitis, causes your finger to get stuck in a bent position. This can make it difficult and painful to straighten your finger. This condition develops when a finger tendon or tendon sheath thickens. Treatment may include resting your finger, wearing a splint, and taking medicines. In severe cases, surgery to open the tendon sheath may be needed. This information is not intended to replace advice given to you by your health care provider. Make sure you discuss any questions you have with your healthcare provider. Document Revised: 06/02/2018 Document Reviewed: 06/02/2018 Elsevier Patient Education  [employer].

## 2020-08-04 ENCOUNTER — Encounter: Payer: Self-pay | Admitting: Family Medicine

## 2020-08-10 ENCOUNTER — Ambulatory Visit (INDEPENDENT_AMBULATORY_CARE_PROVIDER_SITE_OTHER): Payer: 59 | Admitting: Psychology

## 2020-08-10 DIAGNOSIS — F411 Generalized anxiety disorder: Secondary | ICD-10-CM

## 2020-08-23 ENCOUNTER — Telehealth (HOSPITAL_COMMUNITY): Payer: Self-pay | Admitting: *Deleted

## 2020-08-23 ENCOUNTER — Other Ambulatory Visit: Payer: Self-pay | Admitting: Psychiatry

## 2020-08-23 MED ORDER — VENLAFAXINE HCL ER 75 MG PO CP24: 225.0000 mg | ORAL_CAPSULE | Freq: Every day | ORAL | 0 refills | Status: DC

## 2020-08-23 NOTE — Telephone Encounter (Signed)
Please ask him the last time he took this medication as dose adjustment may be needed. Please also make sure to have follow up with DR. Akthar.

## 2020-08-23 NOTE — Telephone Encounter (Signed)
noted 

## 2020-08-23 NOTE — Telephone Encounter (Signed)
Patient pharmacy [facility] requesting refill for patient Venlafaxine ER 75 mg 3 caps in AM.

## 2020-08-23 NOTE — Telephone Encounter (Signed)
Patient pharmacy [facility] in [place] faxed refill request for patient Brooke Kaiser 100 mg  with quantity of 90.

## 2020-08-23 NOTE — Telephone Encounter (Signed)
LMOM

## 2020-08-23 NOTE — Telephone Encounter (Signed)
Ordered

## 2020-08-24 ENCOUNTER — Other Ambulatory Visit: Payer: Self-pay | Admitting: Psychiatry

## 2020-08-24 MED ORDER — LAMOTRIGINE 100 MG PO TABS: ORAL_TABLET | ORAL | 0 refills | Status: DC

## 2020-08-24 NOTE — Telephone Encounter (Signed)
Ordered

## 2020-08-24 NOTE — Telephone Encounter (Signed)
Patient stated she last took this medication last night

## 2020-08-31 LAB — HM COLONOSCOPY

## 2020-09-02 ENCOUNTER — Telehealth (INDEPENDENT_AMBULATORY_CARE_PROVIDER_SITE_OTHER): Payer: 59 | Admitting: Psychiatry

## 2020-09-02 ENCOUNTER — Encounter (HOSPITAL_COMMUNITY): Payer: Self-pay | Admitting: Psychiatry

## 2020-09-02 DIAGNOSIS — F411 Generalized anxiety disorder: Secondary | ICD-10-CM

## 2020-09-02 DIAGNOSIS — F331 Major depressive disorder, recurrent, moderate: Secondary | ICD-10-CM

## 2020-09-02 MED ORDER — VENLAFAXINE HCL ER 75 MG PO CP24: 225.0000 mg | ORAL_CAPSULE | Freq: Every day | ORAL | 0 refills | Status: DC

## 2020-09-02 NOTE — Progress Notes (Signed)
Patient ID: Brooke Kaiser, female   DOB: Jun 04, 1970, 50 y.o.   MRN: [id number] [facility] MD Progress Note  09/02/2020 10:27 AM Brooke Kaiser  MRN:  [id number]  Virtual Visit via Video Note  I connected with Brooke Kaiser on 09/02/20 at 10:15 AM EDT by a video enabled telemedicine application and verified that I am speaking with the correct person using two identifiers.  Location: Patient: parked car Provider: home office   I discussed the limitations of evaluation and management by telemedicine and the availability of in person appointments. The patient expressed understanding and agreed to proceed.      I discussed the assessment and treatment plan with the patient. The patient was provided an opportunity to ask questions and all were answered. The patient agreed with the plan and demonstrated an understanding of the instructions.   The patient was advised to call back or seek an in-person evaluation if the symptoms worsen or if the condition fails to improve as anticipated.  I provided 15   minutes of non-face-to-face time during this encounter.  Patient is doing fair her mom is sick with cancer but she is handling her she can be tough at times.  Overall job and relationship is going on well does not endorse any day-to-day concerns     effexor helps anxiety Modifying factor: Job  Aggravating factor: mom sickness Severity of depression: 7.5/10  No associated psychotic symptoms or mania.  Principal Problem: GAD and MDD Diagnosis:   Patient Active Problem List   Diagnosis Date Noted   Trigger middle finger of right hand [M65.331] 08/03/2020   Elevated hemoglobin ([place]) [D58.2] 03/04/2018   Vitamin D deficiency [E55.9] 03/04/2018   Gouty arthritis [M10.9] 11/04/2013   ANA positive [R76.8] 09/22/2013   Muscle ache [M79.10] 09/22/2013   Collagen disease ([place]) [M35.9] 09/22/2013   Elevated erythrocyte sedimentation rate [R70.0] 09/22/2013   IFG (impaired fasting glucose) [R73.01]  06/24/2012   Asthmatic bronchitis [J45.909] 10/16/2011   GAD (generalized anxiety disorder) [F41.1] 07/30/2011   Essential hypertension [I10] 04/07/2010   UNSPECIFIED ANEMIA [D64.9] 03/29/2010   Severe episode of recurrent major depressive disorder ([place]) [F33.2] 07/22/2009   OTHER ABNORMAL GLUCOSE [R73.09] 05/25/2008   HYPERTRIGLYCERIDEMIA [E78.1] 11/04/2007   FATIGUE [R53.81, R53.83] 11/04/2007   EXTERNAL HEMORRHOIDS [K64.4] 07/22/2007   BACK PAIN [M54.9] 07/22/2007   TOBACCO ABUSE [F17.200] 07/01/2007   GERD [K21.9] 07/01/2007   ABNORMAL THYROID FUNCTION TESTS [R94.6] 04/22/2007   Total Time spent with patient: 25 minutes   Past Medical History:  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  9-05   Family History:  Family History  Problem Relation Age of Onset   Cancer Mother 1       Breast   Colon polyps Mother    Other Mother        colon polyps   Dementia Mother    Anxiety disorder Mother    Depression Mother    Paranoid behavior Mother    Depression Father    Suicidality Father        commited suicide   Alcohol abuse Father    Colon polyps Maternal Aunt        cancerous colon polyps  Cancer Maternal Uncle        cancerous colon polyps   Heart disease Other    Other Other        thyroid disorder   Alcohol abuse Paternal Uncle    Dementia Maternal Grandmother    Alcohol abuse Paternal Grandmother    Alcohol abuse Paternal Uncle    Social History:  Social History   Substance and Sexual Activity  Alcohol Use No   Alcohol/week: 0.3 standard drinks     Social History   Substance and Sexual Activity  Drug Use No    Social History   Socioeconomic History   Marital status: Single    Spouse name: Not on file   Number of children: Not on file    Years of education: Not on file   Highest education level: Not on file  Occupational History   Occupation: [place]    Employer: [employer]  Tobacco Use   Smoking status: Every Day    Packs/day: 1.00    Years: 27.00    Pack years: 27.00    Types: Cigarettes   Smokeless tobacco: Never  Vaping Use   Vaping Use: Never used  Substance and Sexual Activity   Alcohol use: No    Alcohol/week: 0.3 standard drinks   Drug use: No   Sexual activity: Yes    Partners: Female    Comment: prefers female sex partner  Other Topics Concern   Not on file  Social History Narrative   Not on file   Social Determinants of Health   Financial Resource Strain: Not on file  Food Insecurity: Not on file  Transportation Needs: Not on file  Physical Activity: Not on file  Stress: Not on file  Social Connections: Not on file   Additional History:    Sleep: Fair  Appetite:  Fair   Assessment: GAD stable, MDD stable      Psychiatric Specialty Exam: Physical Exam  Review of Systems  Cardiovascular:  Negative for chest pain.  Skin:  Negative for rash.  Psychiatric/Behavioral:  Positive for depression. Negative for hallucinations and suicidal ideas.    Last menstrual period 06/24/2013.There is no height or weight on file to calculate BMI.  General Appearance: Neat and Well Groomed  Engineer, water::  Good  Speech:  Clear and Coherent  Volume:  Normal  Mood: Fair  Affect:  congruent  Thought Process:  Goal Directed, Intact, Linear and Logical  Orientation:  Full (Time, Place, and Person)  Thought Content:  WDL  Suicidal Thoughts:  No  Homicidal Thoughts:  No  Memory:  Immediate;   Good Recent;   Good Remote;   Good  Judgement:  Good  Insight:  Present  Psychomotor Activity:  Normal  Concentration:  Good  Recall:  Good  Fund of Knowledge:Good  Language: Good  Akathisia:  No  Handed:  Right  AIMS (if indicated):     Assets:  Communication Skills Desire for  [place] Talents/Skills Transportation Vocational/Educational  ADL's:  Intact  Cognition: WNL  Sleep:  Fair  Notes she is sleeping during the day.     Current Medications: Current Outpatient Prescriptions  Medication Sig Dispense Refill   lamoTRIgine (LAMICTAL) 100 MG tablet Take one tablet at day for mood stabilization. 30 tablet 1   losartan-hydrochlorothiazide (HYZAAR) 100-25 MG per tablet Take 1 tablet by mouth daily. 30 tablet 5   venlafaxine XR (EFFEXOR-XR) 75 MG 24 hr capsule  Take 3 capsules (225 mg total) by mouth daily with breakfast. 90 capsule 0   VENTOLIN HFA 108 (90 BASE) MCG/ACT inhaler INHALE 4 PUFFS INTO THE LUNGS 2 TIMES DAILY AS NEEDED. 18 Inhaler 3   No current facility-administered medications for this visit.    Lab Results:  No results found for this or any previous visit (from the past 48 hour(s)).  Physical Findings: AIMS: CIWA:   COWS:    Treatment Plan Summary: Medication management Prior documentation reviewed  MDD : Doing fair continue Lamictal and Effexor 190-day supply  Mood disorder: See above  GAD: Managing it continue Effexor   Follow-up in 6 months or earlier if needed Damareon Lanni  10:27 AM 09/02/2020

## 2020-11-14 ENCOUNTER — Encounter: Payer: Self-pay | Admitting: Family Medicine

## 2020-11-14 ENCOUNTER — Other Ambulatory Visit: Payer: Self-pay

## 2020-11-14 ENCOUNTER — Ambulatory Visit: Payer: 59 | Admitting: Family Medicine

## 2020-11-14 VITALS — BP 126/74 | HR 78 | Ht 69.0 in | Wt 247.0 lb

## 2020-11-14 DIAGNOSIS — R110 Nausea: Secondary | ICD-10-CM | POA: Diagnosis not present

## 2020-11-14 DIAGNOSIS — R0602 Shortness of breath: Secondary | ICD-10-CM

## 2020-11-14 DIAGNOSIS — E559 Vitamin D deficiency, unspecified: Secondary | ICD-10-CM | POA: Diagnosis not present

## 2020-11-14 DIAGNOSIS — R5383 Other fatigue: Secondary | ICD-10-CM | POA: Diagnosis not present

## 2020-11-14 DIAGNOSIS — J029 Acute pharyngitis, unspecified: Secondary | ICD-10-CM

## 2020-11-14 DIAGNOSIS — R42 Dizziness and giddiness: Secondary | ICD-10-CM | POA: Diagnosis not present

## 2020-11-14 DIAGNOSIS — R002 Palpitations: Secondary | ICD-10-CM

## 2020-11-14 LAB — D-DIMER, QUANTITATIVE: D-Dimer, Quant: 0.2 mcg/mL FEU (ref <0.50)

## 2020-11-14 NOTE — Progress Notes (Signed)
Established Patient Office Visit  Subjective:  Patient ID: Brooke Kaiser, female    DOB: 07-Jan-1971  Age: 50 y.o. MRN: [id number]  CC:  Chief Complaint  Patient presents with   Fatigue    HPI Brooke Kaiser presents for Fatigue.  She says that for the last couple of weeks she is just felt more tired in general very fatigued.  She is also felt a little bit more nauseated on and off during that time.  But then suddenly last Wednesday, about 5 days ago she felt dizzy.  The room was spinning she says it persisted for 24 hours nonstop she was finally able to sleep some.  She felt very nauseated that day with the dizziness.  She says that it felt different than when she has had vertigo before.   She can feel dizzy just sitting.  She has been having palpitations as well.  She says she cannot quite tell if it is just beating fast or if there are palpitations.  But she has felt it on and off as well.  She is a smoker and obese.  History of vitamin D deficiency-she does take her vitamin D daily.  Also takes magnesium daily and takes iron a couple times a week because it is constipating. Also c/o of mild ST on and off x 3 weeks.    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  9-05    Family History  Problem Relation Age of Onset   Cancer Mother 68       Breast   Colon polyps Mother    Other Mother        colon polyps   Dementia Mother    Anxiety disorder Mother    Depression Mother    Paranoid behavior Mother    Depression Father    Suicidality Father        commited suicide   Alcohol abuse Father    Colon polyps Maternal Aunt        cancerous colon polyps   Cancer Maternal Uncle        cancerous colon polyps   Heart disease Other    Other Other        thyroid disorder    Alcohol abuse Paternal Uncle    Dementia Maternal Grandmother    Alcohol abuse Paternal Grandmother    Alcohol abuse Paternal Uncle     Social History   Socioeconomic History   Marital status: Single    Spouse name: Not on file   Number of children: Not on file   Years of education: Not on file   Highest education level: Not on file  Occupational History   Occupation: [place]    Employer: [employer]  Tobacco Use   Smoking status: Every Day    Packs/day: 1.00    Years: 27.00    Pack years: 27.00    Types: Cigarettes   Smokeless tobacco: Never  Vaping Use   Vaping Use: Never used  Substance and Sexual Activity   Alcohol use: No    Alcohol/week: 0.3 standard drinks   Drug use: No   Sexual activity: Yes    Partners: Female    Comment:  prefers female sex partner  Other Topics Concern   Not on file  Social History Narrative   Not on file   Social Determinants of Health   Financial Resource Strain: Not on file  Food Insecurity: Not on file  Transportation Needs: Not on file  Physical Activity: Not on file  Stress: Not on file  Social Connections: Not on file  Intimate Partner Violence: Not on file    Outpatient Medications Prior to Visit  Medication Sig Dispense Refill   albuterol (VENTOLIN HFA) 108 (90 Base) MCG/ACT inhaler INHALE 2-4 PUFFS INTO THE LUNGS 2 TIMES DAILY AS NEEDED. 18 each 11   lamoTRIgine (LAMICTAL) 100 MG tablet One a day 90 tablet 0   losartan (COZAAR) 100 MG tablet Take 1 tablet (100 mg total) by mouth daily. 90 tablet 1   venlafaxine XR (EFFEXOR-XR) 75 MG 24 hr capsule Take 3 capsules (225 mg total) by mouth daily. 270 capsule 0   No facility-administered medications prior to visit.    Allergies  Allergen Reactions   Clavulanic Acid Nausea And Vomiting   Sanctura [Trospium] Other (See Comments)    Dizziness    ROS Review of Systems    Objective:    Physical Exam Constitutional:      Appearance: She is well-developed.   HENT:     Head: Normocephalic and atraumatic.     Right Ear: Tympanic membrane, ear canal and external ear normal.     Left Ear: Tympanic membrane, ear canal and external ear normal.     Nose: Nose normal.     Mouth/Throat:     Mouth: Mucous membranes are moist.     Pharynx: Oropharynx is clear. No oropharyngeal exudate or posterior oropharyngeal erythema.  Eyes:     Conjunctiva/sclera: Conjunctivae normal.     Pupils: Pupils are equal, round, and reactive to light.  Neck:     Thyroid: No thyromegaly.  Cardiovascular:     Rate and Rhythm: Normal rate and regular rhythm.     Heart sounds: Normal heart sounds.  Pulmonary:     Effort: Pulmonary effort is normal.     Breath sounds: Normal breath sounds. No wheezing.  Musculoskeletal:     Cervical back: Neck supple.  Lymphadenopathy:     Cervical: No cervical adenopathy.  Skin:    General: Skin is warm and dry.  Neurological:     Mental Status: She is alert and oriented to person, place, and time.     Comments: Negative Dix-Hallpike maneuver.  Though she did start to feel some dizziness with when she was almost flat onto the table.  But unable to visualize any nystagmus on exam.    BP 126/74   Pulse 78   Ht 5\' 9"  (1.753 m)   Wt 247 lb (112 kg)   LMP 06/24/2013   SpO2 98%   BMI 36.48 kg/m  Wt Readings from Last 3 Encounters:  11/14/20 247 lb (112 kg)  08/03/20 250 lb (113.4 kg)  06/29/20 253 lb (114.8 kg)     There are no preventive care reminders to display for this patient.   There are no preventive care reminders to display for this patient.  Lab Results  Component Value Date   TSH 0.98 06/03/2019   Lab Results  Component Value Date   WBC 7.3 06/29/2020   HGB 14.7 06/29/2020   HCT 42.4 06/29/2020   MCV 90.0 06/29/2020   PLT 249 06/29/2020   Lab Results  Component Value Date  NA 138 06/29/2020   K 4.6 06/29/2020   CO2 25 06/29/2020   GLUCOSE 125 (H) 06/29/2020   BUN 16 06/29/2020   CREATININE 0.70  06/29/2020   BILITOT 0.5 06/29/2020   ALKPHOS 58 09/14/2016   AST 17 06/29/2020   ALT 24 06/29/2020   PROT 6.7 06/29/2020   ALBUMIN 4.0 09/14/2016   CALCIUM 9.3 06/29/2020   GFR 89 11/08/2011   Lab Results  Component Value Date   CHOL 188 06/03/2019   Lab Results  Component Value Date   HDL 34 (L) 06/03/2019   Lab Results  Component Value Date   LDLCALC 113 (H) 06/03/2019   Lab Results  Component Value Date   TRIG 285 (H) 06/03/2019   Lab Results  Component Value Date   CHOLHDL 5.5 (H) 06/03/2019   Lab Results  Component Value Date   HGBA1C 6.3 (H) 06/29/2020      Assessment & Plan:   Problem List Items Addressed This Visit       Other   Vitamin D deficiency   Relevant Orders   Vitamin D (25 hydroxy)   Magnesium   B12   Vitamin B1   CBC with Differential/Platelet   TSH   Fe+TIBC+Fer   Other Visit Diagnoses     Other fatigue    -  Primary   Relevant Orders   Vitamin D (25 hydroxy)   Magnesium   B12   Vitamin B1   CBC with Differential/Platelet   TSH   Fe+TIBC+Fer   Vertigo       Relevant Orders   Vitamin D (25 hydroxy)   Magnesium   B12   Vitamin B1   CBC with Differential/Platelet   TSH   Fe+TIBC+Fer   Nausea       Relevant Orders   Vitamin D (25 hydroxy)   Magnesium   B12   Vitamin B1   CBC with Differential/Platelet   TSH   Fe+TIBC+Fer   Sore throat       Relevant Orders   Vitamin D (25 hydroxy)   Magnesium   B12   Vitamin B1   CBC with Differential/Platelet   TSH   Fe+TIBC+Fer   Palpitations       Relevant Orders   D-dimer, quantitative (Completed)   SOB (shortness of breath)       Relevant Orders   D-dimer, quantitative (Completed)      Vertigo - suspect a viral vestibulitis.  Discussed potential possibilities.  Dix-Hallpike maneuver caused her to feel more dizzy but I did not witness nystagmus on exam.  Ear nose and throat exam is otherwise normal.  I suspect that this will probably get better in the next week or  2.  The Ambien go ahead and have her do the vestibular exercises just in case.  If she is not improving or gets worse then please let me know.  Shortness of breath-unclear etiology may be related to her anxiety but I would like to check a D-dimer today since she is also been having palpitations intermittently.  Palpitations-cardiac exam normal today with normal rate and rhythm.  If persist could work-up further with cardiac monitor if needed.  We will check CBC to evaluate for anemia.  We will also check for deficiencies she does have a history of vitamin D and iron deficiency.  Nausea-I feel like it is directly related to the vertigo sensation.  No orders of the defined types were placed in this encounter.   Follow-up: No  follow-ups on file.    [name], MD

## 2020-11-15 NOTE — Progress Notes (Signed)
Hi Brooke Kaiser, vitamin D is finally back in the normal range again which is great.  It still on the low end so make sure you are taking your vitamin D.  Plus you need to make sure you are taking it through the fall and winter every year anyway.  Your magnesium is normal.  Vitamin B12 is good.  Your hemoglobin is slightly elevated but this is secondary to smoking.  Your thyroid looks great at 1.3.  Iron levels are also normal.  The only thing still pending is vitamin B1.  That can take a couple days to come back sometimes.

## 2020-11-19 LAB — CBC WITH DIFFERENTIAL/PLATELET
Absolute Monocytes: 672 cells/uL (ref 200–950)
Basophils Absolute: 34 cells/uL (ref 0–200)
Basophils Relative: 0.4 %
Eosinophils Absolute: 213 cells/uL (ref 15–500)
Eosinophils Relative: 2.5 %
HCT: 45.4 % — ABNORMAL HIGH (ref 35.0–45.0)
Hemoglobin: 15.8 g/dL — ABNORMAL HIGH (ref 11.7–15.5)
Lymphs Abs: 2958 cells/uL (ref 850–3900)
MCH: 30.9 pg (ref 27.0–33.0)
MCHC: 34.8 g/dL (ref 32.0–36.0)
MCV: 88.8 fL (ref 80.0–100.0)
MPV: 9.7 fL (ref 7.5–12.5)
Monocytes Relative: 7.9 %
Neutro Abs: 4624 cells/uL (ref 1500–7800)
Neutrophils Relative %: 54.4 %
Platelets: 292 10*3/uL (ref 140–400)
RBC: 5.11 10*6/uL — ABNORMAL HIGH (ref 3.80–5.10)
RDW: 13.1 % (ref 11.0–15.0)
Total Lymphocyte: 34.8 %
WBC: 8.5 10*3/uL (ref 3.8–10.8)

## 2020-11-19 LAB — VITAMIN B1: Vitamin B1 (Thiamine): 10 nmol/L (ref 8–30)

## 2020-11-19 LAB — IRON,TIBC AND FERRITIN PANEL
%SAT: 33 % (calc) (ref 16–45)
Ferritin: 81 ng/mL (ref 16–232)
Iron: 122 ug/dL (ref 45–160)
TIBC: 367 mcg/dL (calc) (ref 250–450)

## 2020-11-19 LAB — TSH: TSH: 1.36 mIU/L

## 2020-11-19 LAB — MAGNESIUM: Magnesium: 2.1 mg/dL (ref 1.5–2.5)

## 2020-11-19 LAB — VITAMIN D 25 HYDROXY (VIT D DEFICIENCY, FRACTURES): Vit D, 25-Hydroxy: 34 ng/mL (ref 30–100)

## 2020-11-19 LAB — VITAMIN B12: Vitamin B-12: 396 pg/mL (ref 200–1100)

## 2020-11-24 NOTE — Progress Notes (Signed)
Hi Brooke Kaiser, your vitamin D levels are looking better were getting there.  Still keep taking your supplement especially through the fall and winter.  Vitamin B1 looks okay.  It is on the low end so I would just recommend taking a good One-A-Day multivitamin.

## 2020-12-06 ENCOUNTER — Encounter: Payer: 59 | Admitting: Family Medicine

## 2020-12-19 ENCOUNTER — Telehealth (INDEPENDENT_AMBULATORY_CARE_PROVIDER_SITE_OTHER): Payer: 59 | Admitting: Family Medicine

## 2020-12-19 ENCOUNTER — Encounter: Payer: Self-pay | Admitting: Family Medicine

## 2020-12-19 VITALS — Temp 97.9°F | Wt 250.0 lb

## 2020-12-19 DIAGNOSIS — J069 Acute upper respiratory infection, unspecified: Secondary | ICD-10-CM

## 2020-12-19 DIAGNOSIS — J4521 Mild intermittent asthma with (acute) exacerbation: Secondary | ICD-10-CM | POA: Diagnosis not present

## 2020-12-19 MED ORDER — AZITHROMYCIN 250 MG PO TABS: ORAL_TABLET | ORAL | 0 refills | Status: DC

## 2020-12-19 MED ORDER — PREDNISONE 20 MG PO TABS: 40.0000 mg | ORAL_TABLET | Freq: Every day | ORAL | 0 refills | Status: DC

## 2020-12-19 MED ORDER — BENZONATATE 200 MG PO CAPS: 200.0000 mg | ORAL_CAPSULE | Freq: Three times a day (TID) | ORAL | 1 refills | Status: DC | PRN

## 2020-12-19 NOTE — Progress Notes (Signed)
Virtual Visit via Telephone Note  I connected with  Brooke Kaiser on 12/19/20 at  1:20 PM EST by telephone and verified that I am speaking with the correct person using two identifiers.   I discussed the limitations, risks, security and privacy concerns of performing an evaluation and management service by telephone and the availability of in person appointments. I also discussed with the patient that there may be a patient responsible charge related to this service. The patient expressed understanding and agreed to proceed.  Participating parties included in this telephone visit include: The patient and the nurse practitioner listed.  The patient is: At home I am: In the office  Subjective:    CC: URI symptoms  HPI: Brooke Kaiser is a 50 y.o. year old female presenting today via telephone visit to discuss URI.  About 6 days ago after work on a Tuesday night patient noticed a sore throat and severe body aches quickly followed.  By the next day she continued with body aches, sore throat, sinus symptoms, cough, chest congestion.  She has continued coughing up brownish-green sputum at times with the cough that is primarily worse at night and first thing in the morning.  She has missed 2 days of work so far.  She took a home COVID test which was negative.  States she is unsure of any sick contacts however last Tuesday while at work she was around a coworker whose wife and child were home sick with the flu or RSV.  States she is mildly dyspneic with wheezing (she does have a history of asthma) and is having to use her albuterol 4 times a day for the past 2 days.  She denies any chest pain, GI/GU symptoms, headaches.  She has been using Mucinex and Vicks VapoRub with minimal relief.     Past medical history, Surgical history, Family history not pertinant except as noted below, Social history, Allergies, and medications have been entered into the medical record, reviewed, and corrections made.    Review of Systems:  All review of systems negative except what is listed in the HPI  Objective:    General:  Patient speaking clearly in complete sentences. No shortness of breath noted.   Alert and oriented x3.   Normal judgment.  No apparent acute distress.  Impression and Recommendations:    1. Viral URI with cough 2. Mild intermittent asthma with exacerbation Educated patient on viral vs bacterial infections. Likely viral, but given that we are going into a holiday weekend and her history of asthmatic bronchitis/URI, will go ahead and write for a Zpak but encouraged her to try to wait until Friday to take it only if not improving by that time. Will send in prednisone burst to start now given her wheezing and frequent use of albuterol inhaler. Benzonatate for cough - discussed pulmonary hygiene and supportive measures: rest, hydration, humidifier use, warm liquids with honey and lemon, warm compresses, steam showers, Mucinex, Flonase, OTC cough/cold/analgesics. Patient aware of signs/symptoms requiring further/urgent evaluation.   - benzonatate (TESSALON) 200 MG capsule; Take 1 capsule (200 mg total) by mouth 3 (three) times daily as needed for cough.  Dispense: 30 capsule; Refill: 1 - predniSONE (DELTASONE) 20 MG tablet; Take 2 tablets (40 mg total) by mouth daily with breakfast for 5 days.  Dispense: 10 tablet; Refill: 0 - azithromycin (ZITHROMAX Z-PAK) 250 MG tablet; Take 2 tablets (500 mg) on  Day 1,  followed by 1 tablet (250 mg) once daily on  Days 2 through 5.  Dispense: 6 tablet; Refill: 0     I discussed the assessment and treatment plan with the patient. The patient was provided an opportunity to ask questions and all were answered. The patient agreed with the plan and demonstrated an understanding of the instructions.   The patient was advised to call back or seek an in-person evaluation if the symptoms worsen or if the condition fails to improve as anticipated.  I  provided 20 minutes of non-face-to-face time during this TELEPHONE encounter.    Terrilyn Saver, NP

## 2020-12-19 NOTE — Progress Notes (Signed)
Patient has had cough, sore throat, low grade fever and congestion for 6 days. She has taken 2 covid tests that have both been negative.  She is also requesting a work note.

## 2020-12-23 ENCOUNTER — Emergency Department (INDEPENDENT_AMBULATORY_CARE_PROVIDER_SITE_OTHER): Payer: 59

## 2020-12-23 ENCOUNTER — Other Ambulatory Visit: Payer: Self-pay | Admitting: Family Medicine

## 2020-12-23 ENCOUNTER — Emergency Department (INDEPENDENT_AMBULATORY_CARE_PROVIDER_SITE_OTHER)
Admission: EM | Admit: 2020-12-23 | Discharge: 2020-12-23 | Disposition: A | Discharge status: Home / Self Care | Payer: 59 | Source: Home / Self Care | Attending: Family Medicine | Admitting: Family Medicine

## 2020-12-23 ENCOUNTER — Other Ambulatory Visit: Payer: Self-pay

## 2020-12-23 DIAGNOSIS — J45909 Unspecified asthma, uncomplicated: Secondary | ICD-10-CM

## 2020-12-23 DIAGNOSIS — J209 Acute bronchitis, unspecified: Secondary | ICD-10-CM

## 2020-12-23 DIAGNOSIS — R053 Chronic cough: Secondary | ICD-10-CM

## 2020-12-23 MED ORDER — METHYLPREDNISOLONE SODIUM SUCC 125 MG IJ SOLR
125.0000 mg | Freq: Once | INTRAMUSCULAR | Status: AC
Start: 2020-12-23 — End: 2020-12-23
  Administered 2020-12-23: 125 mg via INTRAMUSCULAR

## 2020-12-23 MED ORDER — PULMICORT FLEXHALER 90 MCG/ACT IN AEPB: INHALATION_SPRAY | RESPIRATORY_TRACT | 1 refills | Status: DC

## 2020-12-23 MED ORDER — GUAIFENESIN-CODEINE 100-10 MG/5ML PO SOLN: ORAL | 0 refills | Status: DC

## 2020-12-23 MED ORDER — PREDNISONE 20 MG PO TABS: ORAL_TABLET | ORAL | 0 refills | Status: DC

## 2020-12-23 MED ORDER — CEFDINIR 300 MG PO CAPS: 300.0000 mg | ORAL_CAPSULE | Freq: Two times a day (BID) | ORAL | 0 refills | Status: DC

## 2020-12-23 NOTE — ED Provider Notes (Signed)
[facility]    CSN: [id number] Arrival date & time: 12/23/20  1808      History   Chief Complaint Chief Complaint  Patient presents with   Cough    Cough and chest congestion x10 days    HPI Brooke Kaiser is a 50 y.o. female.   Ten days ago patient developed typical URI symptoms with sinus congestion, cough, myalgias and fatigue, with a negative home COVID19 test.  She has a past history of mild asthma that is most active during URI's.  She developed increased wheezing and chest congestion, requiring the use of her albuterol inhaler several times daily. Four days ago she sought an e-visit, wherein she was prescribed a Z-pack, a five day course of prednisone, and Tessalon perles.  She improved somewhat initially, but her cough is now worse and she complains of shortness of breath and wheezing with minimal improvement from her albuterol inhaler.  However, she denies pleuritic pain and fevers, chills, and sweats. She notes that her asthma is most active during respiratory infections.  She also tends to need her albuterol inhaler several times per month when she is physically active.  She continues to smoke.  The history is provided by the patient.   Past Medical History:  Diagnosis Date   Abnormal thyroid blood test    Anemia    Anxiety    Depression    Hypertension    Tobacco abuse     Patient Active Problem List   Diagnosis Date Noted   Trigger middle finger of right hand 08/03/2020   Elevated hemoglobin (HCC) 03/04/2018   Vitamin D deficiency 03/04/2018   Gouty arthritis 11/04/2013   ANA positive 09/22/2013   Muscle ache 09/22/2013   Collagen disease ([place]) 09/22/2013   Elevated erythrocyte sedimentation rate 09/22/2013   IFG (impaired fasting glucose) 06/24/2012   Asthmatic bronchitis 10/16/2011   GAD (generalized anxiety disorder) 07/30/2011   Essential hypertension 04/07/2010   UNSPECIFIED ANEMIA 03/29/2010   Severe episode of recurrent major  depressive disorder ([place]) 07/22/2009   OTHER ABNORMAL GLUCOSE 05/25/2008   HYPERTRIGLYCERIDEMIA 11/04/2007   FATIGUE 11/04/2007   EXTERNAL HEMORRHOIDS 07/22/2007   BACK PAIN 07/22/2007   TOBACCO ABUSE 07/01/2007   GERD 07/01/2007   ABNORMAL THYROID FUNCTION TESTS 04/22/2007    Past Surgical History:  Procedure Laterality Date   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      Home Medications    Prior to Admission medications   Medication Sig Start Date End Date Taking? Authorizing Provider  albuterol (VENTOLIN HFA) 108 (90 Base) MCG/ACT inhaler INHALE 2-4 PUFFS INTO THE LUNGS 2 TIMES DAILY AS NEEDED. 06/29/20  Yes Hali Marry, MD  Budesonide (PULMICORT FLEXHALER) 90 MCG/ACT inhaler Inhale two puffs twice daily. 12/23/20  Yes Kandra Nicolas, MD  cefdinir (OMNICEF) 300 MG capsule Take 1 capsule (300 mg total) by mouth 2 (two) times daily. 12/23/20  Yes Kandra Nicolas, MD  guaiFENesin-codeine 100-10 MG/5ML syrup Take 77mL by mouth at bedtime as needed for cough. 12/23/20  Yes Kandra Nicolas, MD  lamoTRIgine (LAMICTAL) 100 MG tablet One a day 08/24/20  Yes Hisada, Elie Goody, MD  losartan (COZAAR) 100 MG tablet Take 1 tablet (100 mg total) by mouth daily. 06/29/20  Yes Hali Marry, MD  predniSONE (DELTASONE) 20 MG tablet Take one tab by mouth twice daily for 5 days, then one  daily for 4 days. Take with food. 12/23/20  Yes Kandra Nicolas, MD  venlafaxine XR (EFFEXOR-XR) 75 MG 24 hr capsule Take 3 capsules (225 mg total) by mouth daily. 09/02/20  Yes Merian Capron, MD    Family History Family History  Problem Relation Age of Onset   Cancer Mother 47       Breast   Colon polyps Mother    Other Mother        colon polyps   Dementia Mother    Anxiety disorder Mother    Depression Mother    Paranoid behavior Mother    Depression Father    Suicidality Father        commited suicide   Alcohol abuse  Father    Colon polyps Maternal Aunt        cancerous colon polyps   Cancer Maternal Uncle        cancerous colon polyps   Heart disease Other    Other Other        thyroid disorder   Alcohol abuse Paternal Uncle    Dementia Maternal Grandmother    Alcohol abuse Paternal Grandmother    Alcohol abuse Paternal Uncle     Social History Social History   Tobacco Use   Smoking status: Every Day    Packs/day: 1.00    Years: 27.00    Pack years: 27.00    Types: Cigarettes   Smokeless tobacco: Never  Vaping Use   Vaping Use: Never used  Substance Use Topics   Alcohol use: No    Alcohol/week: 0.3 standard drinks   Drug use: No     Allergies   Clavulanic acid and [place] [trospium]   Review of Systems Review of Systems + sore throat with cough + cough No pleuritic pain + wheezing + nasal congestion + post-nasal drainage No sinus pain/pressure No itchy/red eyes No earache No hemoptysis + SOB No fever/chills No nausea No vomiting No abdominal pain No diarrhea No urinary symptoms No skin rash No fatigue No myalgias No headache   Physical Exam Triage Vital Signs ED Triage Vitals  Enc Vitals Group     BP 12/23/20 1908 (!) 141/81     Pulse Rate 12/23/20 1908 89     Resp 12/23/20 1908 18     Temp 12/23/20 1908 97.7 F (36.5 C)     Temp Source 12/23/20 1908 Oral     SpO2 12/23/20 1908 97 %     Weight 12/23/20 1855 245 lb (111.1 kg)     Height 12/23/20 1855 5' 8.5" (1.74 m)     Head Circumference --      Peak Flow --      Pain Score 12/23/20 1854 6     Pain Loc --      Pain Edu? --      Excl. in [place]? --    No data found.  Updated Vital Signs BP (!) 141/81 (BP Location: Left Arm)   Pulse 89   Temp 97.7 F (36.5 C) (Oral)   Resp 18   Ht 5' 8.5" (1.74 m)   Wt 111.1 kg   LMP 06/24/2013   SpO2 97%   BMI 36.71 kg/m   Visual Acuity Right Eye Distance:   Left Eye Distance:   Bilateral Distance:    Right Eye Near:   Left Eye Near:     Bilateral Near:     Physical Exam Nursing notes and Vital Signs reviewed. Appearance:  Patient appears stated  age, and in no acute distress, but coughing frequently. Eyes:  Pupils are equal, round, and reactive to light and accomodation.  Extraocular movement is intact.  Conjunctivae are not inflamed  Ears:  Canals normal.  Tympanic membranes normal.  Nose:  Congested turbinates.  No sinus tenderness.   Pharynx:  Normal Neck:  Supple.  No adenopathy  Lungs:  Clear to auscultation.  Breath sounds are equal.  Moving air well. Heart:  Regular rate and rhythm without murmurs, rubs, or gallops.  Abdomen:  Nontender without masses or hepatosplenomegaly.  Bowel sounds are present.  No CVA or flank tenderness.  Extremities:  No edema.  Skin:  No rash present.   UC Treatments / Results  Labs (all labs ordered are listed, but only abnormal results are displayed) Labs Reviewed - No data to display  EKG   Radiology DG Chest 2 View  Result Date: 12/23/2020 CLINICAL DATA:  Productive cough times 10 days. EXAM: CHEST - 2 VIEW COMPARISON:  Jun 03, 2019 FINDINGS: The heart size and mediastinal contours are within normal limits. Radiopaque surgical clips are seen within the posteromedial aspect of the left upper lobe. Both lungs are otherwise clear. The visualized skeletal structures are unremarkable. IMPRESSION: No active cardiopulmonary disease. Electronically Signed   By: Virgina Norfolk M.D.   On: 12/23/2020 20:15    Procedures Procedures (including critical care time)  Medications Ordered in UC Medications  methylPREDNISolone sodium succinate (SOLU-MEDROL) 125 mg/2 mL injection 125 mg (125 mg Intramuscular Given 12/23/20 2126)    Initial Impression / Assessment and Plan / UC Course  I have reviewed the triage vital signs and the nursing notes.  Pertinent labs & imaging results that were available during my care of the patient were reviewed by me and considered in my medical decision  making (see chart for details).    Patient's chart and past history reviewed. Negative chest x-ray reassuring. Suspect persistent bronchospasm after recent URI. Will begin empiric Omnicef. Administered Solumedrol 80mg  IM, then begin prednisone burst/taper. Rx trial of Pulmicort inhaler. Rx for Robitussin AC for night time cough.  Controlled Substance Prescriptions I have consulted the [place] Controlled Substances Registry for this patient, and feel the risk/benefit ratio today is favorable for proceeding with this prescription for a controlled substance.  Followup with Family Doctor if not improved in 10 days. Recommend discontinuing smoking.  Final Clinical Impressions(s) / UC Diagnoses   Final diagnoses:  Acute bronchitis with bronchospasm  Mild reactive airways disease, unspecified whether persistent     Discharge Instructions      Begin prednisone Saturday 12/23/20. Take plain guaifenesin (1200mg  extended release tabs such as Mucinex) twice daily, with plenty of water, for cough and congestion.  Get adequate rest.   May use Afrin nasal spray (or generic oxymetazoline) each morning for about 5 days and then discontinue.  Also recommend using saline nasal spray several times daily and saline nasal irrigation (AYR is a common brand).  Use Flonase nasal spray each morning after using Afrin nasal spray and saline nasal irrigation. Try warm salt water gargles for sore throat.  Stop all antihistamines for now, and other non-prescription cough/cold preparations. Continue albuterol inhaler as needed.      ED Prescriptions     Medication Sig Dispense Auth. Provider   cefdinir (OMNICEF) 300 MG capsule Take 1 capsule (300 mg total) by mouth 2 (two) times daily. 14 capsule Kandra Nicolas, MD   predniSONE (DELTASONE) 20 MG tablet Take one tab by mouth twice  daily for 5 days, then one daily for 4 days. Take with food. 14 tablet Kandra Nicolas, MD   guaiFENesin-codeine 100-10 MG/5ML  syrup Take 12mL by mouth at bedtime as needed for cough. 50 mL Kandra Nicolas, MD   Budesonide (PULMICORT FLEXHALER) 90 MCG/ACT inhaler Inhale two puffs twice daily. 1 each Kandra Nicolas, MD         Kandra Nicolas, MD 12/25/20 1900

## 2020-12-23 NOTE — ED Triage Notes (Signed)
Pt states that she was diagnosed with a URI recently. Pt state that she has a cough and some chest congestion. X10 days  Pt states that she had a negative covid test 11/21.  Pt is vaccinated for covid. Pt hasn't had flu vaccine.

## 2020-12-23 NOTE — Discharge Instructions (Addendum)
Begin prednisone Saturday 12/23/20. Take plain guaifenesin (1200mg  extended release tabs such as Mucinex) twice daily, with plenty of water, for cough and congestion.  Get adequate rest.   May use Afrin nasal spray (or generic oxymetazoline) each morning for about 5 days and then discontinue.  Also recommend using saline nasal spray several times daily and saline nasal irrigation (AYR is a common brand).  Use Flonase nasal spray each morning after using Afrin nasal spray and saline nasal irrigation. Try warm salt water gargles for sore throat.  Stop all antihistamines for now, and other non-prescription cough/cold preparations. Continue albuterol inhaler as needed.

## 2020-12-24 ENCOUNTER — Other Ambulatory Visit (HOSPITAL_COMMUNITY): Payer: Self-pay | Admitting: Psychiatry

## 2020-12-27 ENCOUNTER — Encounter: Payer: 59 | Admitting: Family Medicine

## 2020-12-28 ENCOUNTER — Telehealth (INDEPENDENT_AMBULATORY_CARE_PROVIDER_SITE_OTHER): Payer: 59 | Admitting: Family Medicine

## 2020-12-28 DIAGNOSIS — R051 Acute cough: Secondary | ICD-10-CM

## 2020-12-28 DIAGNOSIS — J4541 Moderate persistent asthma with (acute) exacerbation: Secondary | ICD-10-CM

## 2020-12-28 DIAGNOSIS — R058 Other specified cough: Secondary | ICD-10-CM | POA: Diagnosis not present

## 2020-12-28 NOTE — Progress Notes (Signed)
    Virtual Visit via Video Note  I connected with Brooke Kaiser on 12/28/20 at  8:50 AM EST by a video enabled telemedicine application and verified that I am speaking with the correct person using two identifiers.   I discussed the limitations of evaluation and management by telemedicine and the availability of in person appointments. The patient expressed understanding and agreed to proceed.  Patient location: at home Provider location: in office  Subjective:    CC:  No chief complaint on file.   HPI:  She was seen for viral URI by one of my partners on 12/19/20. Given Azithromycin which she started.  Not improving so went to Urgent Care on 11/25. Given Omnicef and prednisone.  Had a nosebleed twice.  Feeling some better today but she is still feeling SOB and coughing. Mostly dry but occ productive. Appetite is improving.  No fever or chills.  She still has 2 more days of ABX and steroid.  The cough med tasted bad so she only used it twice.     Past medical history, Surgical history, Family history not pertinant except as noted below, Social history, Allergies, and medications have been entered into the medical record, reviewed, and corrections made.    Objective:    General: Speaking clearly in complete sentences without any shortness of breath.  Alert and oriented x3.  Normal judgment. No apparent acute distress.    Impression and Recommendations:    Problem List Items Addressed This Visit   None Visit Diagnoses     Acute cough    -  Primary   Moderate persistent asthma with exacerbation       Post-viral cough syndrome          Make sure complete the antibiotic and prednisone. She will pick up the Pulmicort today. Pharmacy didn't have in [place]. Recommend symptomatic care.  Recommend trial of Delsym or Robitussin. Keep using albuterol QID until she is feeling better.   No orders of the defined types were placed in this encounter.   No orders of the defined types were  placed in this encounter.    I discussed the assessment and treatment plan with the patient. The patient was provided an opportunity to ask questions and all were answered. The patient agreed with the plan and demonstrated an understanding of the instructions.   The patient was advised to call back or seek an in-person evaluation if the symptoms worsen or if the condition fails to improve as anticipated.I spent 20 minutes on the day of the encounter to include pre-visit record review, face-to-face time with the patient and post visit ordering of test.   [name], MD

## 2020-12-28 NOTE — Progress Notes (Signed)
Pt reports that she is still having problems with her breathing. She stated that it has gotten better. She said that she can hardly get short sentences out without it causing some SOB. Still coughing up mucus. She wonders if she will need more prednisone or additional meds to help with this. She hasn't completed the prednisone or the ABX. She doesn't; like the cough syrup because of the taste.

## 2021-01-08 ENCOUNTER — Emergency Department: Admission: EM | Admit: 2021-01-08 | Discharge: 2021-01-08 | Discharge status: Absent without leave | Payer: Self-pay

## 2021-01-08 ENCOUNTER — Encounter (HOSPITAL_BASED_OUTPATIENT_CLINIC_OR_DEPARTMENT_OTHER): Payer: Self-pay | Admitting: Emergency Medicine

## 2021-01-08 ENCOUNTER — Emergency Department (HOSPITAL_BASED_OUTPATIENT_CLINIC_OR_DEPARTMENT_OTHER)
Admission: EM | Admit: 2021-01-08 | Discharge: 2021-01-08 | Disposition: A | Discharge status: Home / Self Care | Payer: 59 | Attending: Emergency Medicine | Admitting: Emergency Medicine

## 2021-01-08 ENCOUNTER — Other Ambulatory Visit: Payer: Self-pay

## 2021-01-08 DIAGNOSIS — F17210 Nicotine dependence, cigarettes, uncomplicated: Secondary | ICD-10-CM | POA: Insufficient documentation

## 2021-01-08 DIAGNOSIS — X500XXA Overexertion from strenuous movement or load, initial encounter: Secondary | ICD-10-CM | POA: Diagnosis not present

## 2021-01-08 DIAGNOSIS — Z79899 Other long term (current) drug therapy: Secondary | ICD-10-CM | POA: Insufficient documentation

## 2021-01-08 DIAGNOSIS — G8911 Acute pain due to trauma: Secondary | ICD-10-CM | POA: Diagnosis not present

## 2021-01-08 DIAGNOSIS — I10 Essential (primary) hypertension: Secondary | ICD-10-CM | POA: Insufficient documentation

## 2021-01-08 DIAGNOSIS — M545 Low back pain, unspecified: Secondary | ICD-10-CM | POA: Diagnosis not present

## 2021-01-08 LAB — URINALYSIS, ROUTINE W REFLEX MICROSCOPIC
Bilirubin Urine: NEGATIVE
Glucose, UA: NEGATIVE mg/dL
Hgb urine dipstick: NEGATIVE
Leukocytes,Ua: NEGATIVE
Nitrite: NEGATIVE
Specific Gravity, Urine: 1.018 (ref 1.005–1.030)
pH: 6.5 (ref 5.0–8.0)

## 2021-01-08 MED ORDER — IBUPROFEN 600 MG PO TABS: 600.0000 mg | ORAL_TABLET | Freq: Four times a day (QID) | ORAL | 0 refills | Status: DC | PRN

## 2021-01-08 MED ORDER — LIDOCAINE 5 % EX PTCH
1.0000 | MEDICATED_PATCH | CUTANEOUS | Status: DC
  Administered 2021-01-08: 1 via TRANSDERMAL
  Filled 2021-01-08: qty 1

## 2021-01-08 MED ORDER — ACETAMINOPHEN-CODEINE #3 300-30 MG PO TABS: 1.0000 | ORAL_TABLET | Freq: Four times a day (QID) | ORAL | 0 refills | Status: DC | PRN

## 2021-01-08 MED ORDER — HYDROCODONE-ACETAMINOPHEN 5-325 MG PO TABS
1.0000 | ORAL_TABLET | Freq: Once | ORAL | Status: AC
  Administered 2021-01-08: 1 via ORAL
  Filled 2021-01-08: qty 1

## 2021-01-08 MED ORDER — METHOCARBAMOL 500 MG PO TABS: 1000.0000 mg | ORAL_TABLET | Freq: Two times a day (BID) | ORAL | 0 refills | Status: DC | PRN

## 2021-01-08 MED ORDER — KETOROLAC TROMETHAMINE 60 MG/2ML IM SOLN
60.0000 mg | Freq: Once | INTRAMUSCULAR | Status: AC
  Administered 2021-01-08: 60 mg via INTRAMUSCULAR
  Filled 2021-01-08: qty 2

## 2021-01-08 MED ORDER — LIDOCAINE 5 % EX PTCH: 1.0000 | MEDICATED_PATCH | Freq: Every day | CUTANEOUS | 0 refills | Status: DC | PRN

## 2021-01-08 NOTE — ED Triage Notes (Signed)
Pt presents to ED POV. Pt c/o lower back pain since Thursday. Pt reports that she has been urinating slightly more frequently.

## 2021-01-08 NOTE — ED Provider Notes (Signed)
[facility] EMERGENCY DEPT Provider Note   CSN: [id number] Arrival date & time: 01/08/21  1451     History Chief Complaint  Patient presents with   Back Pain    Brooke Kaiser is a 50 y.o. female.  This is a 50 y.o. female with significant medical history as below, including HTN, anemia who presents to the ED with complaint of low back pain.  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  He is having low back pain on Wednesday, progressively worsened over the past few days.  Pain is paraspinal, bilateral, does radiate to her flanks.  She is ambulatory, no nausea or vomiting, no change urination or bowel function.  No fevers or chills.  No abdominal pain.  Pain occurs intermittently, described as aching, squeezing sensation.  Currently her pain is very mild.  No recent falls or trauma, no numbness or tingling to her groin.  No urinary overflow or incontinence.  No rashes, no IV drug use, no history of spinal injection or recent spinal surgery  The history is provided by the patient. No language interpreter was used.  Back Pain Associated symptoms: no abdominal pain, no chest pain, no dysuria, no fever and no headaches       Past Medical History:  Diagnosis Date   Abnormal thyroid blood test    Anemia    Anxiety    Depression    Hypertension    Tobacco abuse     Patient Active Problem List   Diagnosis Date Noted   Trigger middle finger of right hand 08/03/2020   Elevated hemoglobin (HCC) 03/04/2018   Vitamin D deficiency 03/04/2018   Gouty arthritis 11/04/2013   ANA positive 09/22/2013   Muscle ache 09/22/2013   Collagen disease ([place]) 09/22/2013   Elevated erythrocyte sedimentation rate 09/22/2013   IFG (impaired fasting glucose) 06/24/2012   Asthmatic bronchitis 10/16/2011   GAD (generalized anxiety disorder) 07/30/2011   Essential hypertension 04/07/2010   UNSPECIFIED ANEMIA 03/29/2010   Severe episode of recurrent major  depressive disorder ([place]) 07/22/2009   OTHER ABNORMAL GLUCOSE 05/25/2008   HYPERTRIGLYCERIDEMIA 11/04/2007   FATIGUE 11/04/2007   EXTERNAL HEMORRHOIDS 07/22/2007   BACK PAIN 07/22/2007   TOBACCO ABUSE 07/01/2007   GERD 07/01/2007   ABNORMAL THYROID FUNCTION TESTS 04/22/2007    Past Surgical History:  Procedure Laterality Date   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     Family History  Problem Relation Age of Onset   Cancer Mother 85       Breast   Colon polyps Mother    Other Mother        colon polyps   Dementia Mother    Anxiety disorder Mother    Depression Mother    Paranoid behavior Mother    Depression Father    Suicidality Father        commited suicide   Alcohol abuse Father    Colon polyps Maternal Aunt        cancerous colon polyps   Cancer Maternal Uncle        cancerous colon polyps   Heart disease Other    Other Other        thyroid disorder   Alcohol abuse Paternal Uncle    Dementia Maternal Grandmother    Alcohol abuse Paternal Grandmother    Alcohol abuse Paternal Uncle  Social History   Tobacco Use   Smoking status: Every Day    Packs/day: 1.00    Years: 27.00    Pack years: 27.00    Types: Cigarettes   Smokeless tobacco: Never  Vaping Use   Vaping Use: Never used  Substance Use Topics   Alcohol use: No    Alcohol/week: 0.3 standard drinks   Drug use: No    Home Medications Prior to Admission medications   Medication Sig Start Date End Date Taking? Authorizing Provider  acetaminophen-codeine (TYLENOL #3) 300-30 MG tablet Take 1-2 tablets by mouth every 6 (six) hours as needed for moderate pain. 01/08/21  Yes Wynona Dove A, DO  ibuprofen (ADVIL) 600 MG tablet Take 1 tablet (600 mg total) by mouth every 6 (six) hours as needed. 01/08/21  Yes Wynona Dove A, DO  lidocaine (LIDODERM) 5 % Place 1 patch onto the skin daily as needed. Remove & Discard patch within  12 hours or as directed by MD 01/08/21  Yes Jeanell Sparrow, DO  methocarbamol (ROBAXIN) 500 MG tablet Take 2 tablets (1,000 mg total) by mouth 2 (two) times daily as needed for muscle spasms. 01/08/21  Yes Wynona Dove A, DO  albuterol (VENTOLIN HFA) 108 (90 Base) MCG/ACT inhaler INHALE 2-4 PUFFS INTO THE LUNGS 2 TIMES DAILY AS NEEDED. 06/29/20   Hali Marry, MD  Budesonide (PULMICORT FLEXHALER) 90 MCG/ACT inhaler Inhale two puffs twice daily. 12/23/20   Kandra Nicolas, MD  cefdinir (OMNICEF) 300 MG capsule Take 1 capsule (300 mg total) by mouth 2 (two) times daily. 12/23/20   Kandra Nicolas, MD  lamoTRIgine (LAMICTAL) 100 MG tablet One a day 08/24/20   Norman Clay, MD  losartan (COZAAR) 100 MG tablet TAKE 1 TABLET BY MOUTH EVERY DAY 12/26/20   Hali Marry, MD  predniSONE (DELTASONE) 20 MG tablet Take one tab by mouth twice daily for 5 days, then one daily for 4 days. Take with food. 12/23/20   Kandra Nicolas, MD  venlafaxine XR (EFFEXOR-XR) 75 MG 24 hr capsule TAKE 3 CAPSULES BY MOUTH DAILY. 12/26/20   Merian Capron, MD    Allergies    Clavulanic acid and [place] [trospium]  Review of Systems   Review of Systems  Constitutional:  Negative for activity change and fever.  HENT:  Negative for facial swelling and trouble swallowing.   Eyes:  Negative for discharge and redness.  Respiratory:  Negative for cough and shortness of breath.   Cardiovascular:  Negative for chest pain and palpitations.  Gastrointestinal:  Negative for abdominal pain and nausea.  Genitourinary:  Negative for dysuria and flank pain.  Musculoskeletal:  Positive for back pain. Negative for gait problem.  Skin:  Negative for pallor and rash.  Neurological:  Negative for syncope and headaches.   Physical Exam Updated Vital Signs BP 115/76 (BP Location: Right Arm)   Pulse 81   Temp 98.5 F (36.9 C)   Resp 16   LMP 06/24/2013   SpO2 100%   Physical Exam Vitals and nursing note reviewed.   Constitutional:      General: She is not in acute distress.    Appearance: Normal appearance.  HENT:     Head: Normocephalic and atraumatic.     Right Ear: External ear normal.     Left Ear: External ear normal.     Nose: Nose normal.     Mouth/Throat:     Mouth: Mucous membranes are moist.  Eyes:  General: No scleral icterus.       Right eye: No discharge.        Left eye: No discharge.     Extraocular Movements: Extraocular movements intact.     Pupils: Pupils are equal, round, and reactive to light.  Cardiovascular:     Rate and Rhythm: Normal rate and regular rhythm.     Pulses: Normal pulses.     Heart sounds: Normal heart sounds.  Pulmonary:     Effort: Pulmonary effort is normal. No respiratory distress.     Breath sounds: Normal breath sounds.  Abdominal:     General: Abdomen is flat.     Tenderness: There is no abdominal tenderness.  Musculoskeletal:        General: Normal range of motion.       Arms:     Cervical back: Normal range of motion.     Right lower leg: No edema.     Left lower leg: No edema.  Skin:    General: Skin is warm and dry.     Capillary Refill: Capillary refill takes less than 2 seconds.  Neurological:     Mental Status: She is alert and oriented to person, place, and time.     GCS: GCS eye subscore is 4. GCS verbal subscore is 5. GCS motor subscore is 6.     Cranial Nerves: Cranial nerves 2-12 are intact.     Sensory: Sensation is intact.     Motor: Motor function is intact.     Coordination: Coordination is intact.     Gait: Gait is intact.  Psychiatric:        Mood and Affect: Mood normal.        Behavior: Behavior normal.    ED Results / Procedures / Treatments   Labs (all labs ordered are listed, but only abnormal results are displayed) Labs Reviewed  URINALYSIS, ROUTINE W REFLEX MICROSCOPIC - Abnormal; Notable for the following components:      Result Value   Ketones, ur TRACE (*)    Protein, ur TRACE (*)    All other  components within normal limits    EKG None  Radiology No results found.  Procedures Procedures   Medications Ordered in ED Medications  lidocaine (LIDODERM) 5 % 1 patch (1 patch Transdermal Patch Applied 01/08/21 1735)  HYDROcodone-acetaminophen (NORCO/VICODIN) 5-325 MG per tablet 1 tablet (1 tablet Oral Given 01/08/21 1734)  ketorolac (TORADOL) injection 60 mg (60 mg Intramuscular Given 01/08/21 1732)    ED Course  I have reviewed the triage vital signs and the nursing notes.  Pertinent labs & imaging results that were available during my care of the patient were reviewed by me and considered in my medical decision making (see chart for details).    MDM Rules/Calculators/A&P                          CC: back pain   This patient complains of back pain; this involves an extensive number of treatment options and is a complaint that carries with it a high risk of complications and morbidity. Vital signs were reviewed. Serious etiologies considered.  Record review:   Previous records obtained and reviewed   Additional history obtained from spouse  Work up as above, notable for:  Urinalysis without evidence of acute infection or hematuria.   Patient presents with low back pain without signs of spinal cord compression, cauda equina syndrome, infection, aneurysm, or other  serious etiology. The patient is neurologically intact. Given the extremely low risk of these diagnoses further testing and evaluation for these possibilities does not appear to be indicated at this time.   Pain greatly improved following intervention in the ER.  She is ambulatory, tolerant oral intake.  Neurologically intact.  Detailed discussions were had with the patient and/or family and caregivers, regarding current findings, and need for close f/u with PCP or on call doctor. The patient has been instructed to return immediately if the symptoms worsen in any way. Patient verbalized understanding and is in  agreement with current care plan. All questions answered prior to discharge.       This chart was dictated using voice recognition software.  Despite best efforts to proofread,  errors can occur which can change the documentation meaning.  Final Clinical Impression(s) / ED Diagnoses Final diagnoses:  Acute bilateral low back pain without sciatica    Rx / DC Orders ED Discharge Orders          Ordered    methocarbamol (ROBAXIN) 500 MG tablet  2 times daily PRN        01/08/21 1720    lidocaine (LIDODERM) 5 %  Daily PRN        01/08/21 1720    acetaminophen-codeine (TYLENOL #3) 300-30 MG tablet  Every 6 hours PRN        01/08/21 1720    ibuprofen (ADVIL) 600 MG tablet  Every 6 hours PRN        01/08/21 1823             Jeanell Sparrow, DO 01/08/21 1825

## 2021-01-08 NOTE — Discharge Instructions (Addendum)
Please follow-up with your Primary Care Physician within the next week. Please take your medications as instructed and discuss any changes to your medications with your primary care physician.    Please return to the Emergency Department if you have any leg numbness, leg weakness, difficulty walking, fevers, worsening of pain, lightheadedness, lose consciousness, severe abdominal pain, severe headache, difficulty urinating, or difficulty having a bowel movement.   Please return to the emergency department immediately for any new or concerning symptoms, or if you get worse.   

## 2021-01-09 ENCOUNTER — Telehealth: Payer: Self-pay | Admitting: General Practice

## 2021-01-09 NOTE — Telephone Encounter (Signed)
Transition Care Management Follow-up Telephone Call Date of discharge and from where: 01/08/21 from [facility] How have you been since you were released from the hospital? Doing better and back at work. Using the lidocaine patch.  Any questions or concerns? No  Items Reviewed: Did the pt receive and understand the discharge instructions provided? Yes  Medications obtained and verified? No  Other? No  Any new allergies since your discharge? No  Dietary orders reviewed? Yes Do you have support at home? Yes   Home Care and Equipment/Supplies: Were home health services ordered? no  Functional Questionnaire: (I = Independent and D = Dependent) ADLs: I  Bathing/Dressing- I  Meal Prep- I  Eating- I  Maintaining continence- I  Transferring/Ambulation- I  Managing Meds- I  Follow up appointments reviewed:  PCP Hospital f/u appt confirmed? No  Scheduled to see Dr. Madilyn Fireman on 01/17/21 @ [facility] f/u appt confirmed? No   Are transportation arrangements needed? No  If their condition worsens, is the pt aware to call PCP or go to the Emergency Dept.? Yes Was the patient provided with contact information for the PCP's office or ED? Yes Was to pt encouraged to call back with questions or concerns? Yes

## 2021-01-17 ENCOUNTER — Inpatient Hospital Stay: Payer: 59 | Admitting: Family Medicine

## 2021-02-01 ENCOUNTER — Other Ambulatory Visit: Payer: Self-pay | Admitting: Psychiatry

## 2021-03-10 ENCOUNTER — Telehealth (HOSPITAL_COMMUNITY): Payer: Self-pay | Admitting: Psychiatry

## 2021-03-16 ENCOUNTER — Other Ambulatory Visit: Payer: Self-pay

## 2021-03-16 ENCOUNTER — Encounter: Payer: Self-pay | Admitting: Family Medicine

## 2021-03-16 ENCOUNTER — Ambulatory Visit: Payer: BC Managed Care – PPO | Admitting: Family Medicine

## 2021-03-16 VITALS — BP 133/84 | HR 85 | Resp 18 | Ht 68.5 in | Wt 252.0 lb

## 2021-03-16 DIAGNOSIS — R600 Localized edema: Secondary | ICD-10-CM | POA: Diagnosis not present

## 2021-03-16 DIAGNOSIS — I10 Essential (primary) hypertension: Secondary | ICD-10-CM | POA: Diagnosis not present

## 2021-03-16 DIAGNOSIS — R0683 Snoring: Secondary | ICD-10-CM | POA: Insufficient documentation

## 2021-03-16 DIAGNOSIS — L739 Follicular disorder, unspecified: Secondary | ICD-10-CM | POA: Diagnosis not present

## 2021-03-16 MED ORDER — METHOCARBAMOL 500 MG PO TABS: 1000.0000 mg | ORAL_TABLET | Freq: Two times a day (BID) | ORAL | 1 refills | Status: DC | PRN

## 2021-03-16 MED ORDER — CHLORTHALIDONE 25 MG PO TABS: 25.0000 mg | ORAL_TABLET | Freq: Every day | ORAL | 0 refills | Status: DC | PRN

## 2021-03-16 NOTE — Patient Instructions (Addendum)
Work on cutting back on salt intake.  This should help tremendously with the swelling and retaining fluid. Also sending over a fluid pill that she could take occasionally if needed for the swelling. Taking a lot of Ibuprofen can make you swell as well.

## 2021-03-16 NOTE — Progress Notes (Signed)
Established Patient Office Visit  Subjective:  Patient ID: Brooke Kaiser, female    DOB: 1970/09/27  Age: 51 y.o. MRN: [id number]  CC:  Chief Complaint  Patient presents with   Insomnia    6 months    Leg Swelling    1 month    Rash    Lower left abdomen, 1 week, painful     HPI Brooke Kaiser presents for   Sleep concerns-she says she is mostly concerned because her partner has no chin and that she snores and that sometimes she will take this really deep breath and sigh in the middle of the night.  She wakes up feeling tired and exhausted in the morning.  2 to 3 days a week she has difficulty falling asleep.  Ankle swelling-she has noticed some lower extremity swelling for anywhere from 3 to 4 weeks.  She says she notices it mostly at the end of the day when she gets ready to take her socks off after work and she will notice really big rings.  She says nothing major has changed recently.  Though in the wintertime she does not drink as much water in fact most of the time she only drinks soda and coffee during the day.  Also noticed a bump come up on her left abdomen.  It started about a week ago and it never drained but she does feel like it is getting gradually better but wanted me to take a look at it today.  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  9-05    Family History  Problem Relation Age of Onset   Cancer Mother 13       Breast   Colon polyps Mother    Other Mother        colon polyps   Dementia Mother    Anxiety disorder Mother    Depression Mother    Paranoid behavior Mother    Depression Father    Suicidality Father        commited suicide   Alcohol abuse Father    Colon polyps Maternal Aunt         cancerous colon polyps   Cancer Maternal Uncle        cancerous colon polyps   Heart disease Other    Other Other        thyroid disorder   Alcohol abuse Paternal Uncle    Dementia Maternal Grandmother    Alcohol abuse Paternal Grandmother    Alcohol abuse Paternal Uncle     Social History   Socioeconomic History   Marital status: Single    Spouse name: Not on file   Number of children: Not on file   Years of education: Not on file   Highest education level: Not on file  Occupational History   Occupation: Hunter    Employer: [employer]  Tobacco Use   Smoking status: Every Day    Packs/day: 1.00    Years: 27.00    Pack years: 27.00    Types: Cigarettes   Smokeless tobacco: Never  Vaping Use   Vaping Use: Never used  Substance and Sexual Activity   Alcohol use: No    Alcohol/week: 0.3 standard drinks   Drug use: No   Sexual activity: Yes    Partners: Female    Comment: prefers female sex partner  Other Topics Concern   Not on file  Social History Narrative   Not on file   Social Determinants of Health   Financial Resource Strain: Not on file  Food Insecurity: Not on file  Transportation Needs: Not on file  Physical Activity: Not on file  Stress: Not on file  Social Connections: Not on file  Intimate Partner Violence: Not on file    Outpatient Medications Prior to Visit  Medication Sig Dispense Refill   albuterol (VENTOLIN HFA) 108 (90 Base) MCG/ACT inhaler INHALE 2-4 PUFFS INTO THE LUNGS 2 TIMES DAILY AS NEEDED. 18 each 11   ibuprofen (ADVIL) 600 MG tablet Take 1 tablet (600 mg total) by mouth every 6 (six) hours as needed. (Patient taking differently: Take 800 mg by mouth every 6 (six) hours as needed.) 30 tablet 0   lamoTRIgine (LAMICTAL) 100 MG tablet TAKE 1 TABLET BY MOUTH EVERY DAY 90 tablet 0   lidocaine (LIDODERM) 5 % Place 1 patch onto the skin daily as needed. Remove & Discard patch within 12 hours or as directed by MD 15 patch 0    losartan (COZAAR) 100 MG tablet TAKE 1 TABLET BY MOUTH EVERY DAY 90 tablet 1   venlafaxine XR (EFFEXOR-XR) 75 MG 24 hr capsule TAKE 3 CAPSULES BY MOUTH DAILY. 270 capsule 0   methocarbamol (ROBAXIN) 500 MG tablet Take 2 tablets (1,000 mg total) by mouth 2 (two) times daily as needed for muscle spasms. 20 tablet 0   Budesonide (PULMICORT FLEXHALER) 90 MCG/ACT inhaler Inhale two puffs twice daily. (Patient not taking: Reported on 03/16/2021) 1 each 1   acetaminophen-codeine (TYLENOL #3) 300-30 MG tablet Take 1-2 tablets by mouth every 6 (six) hours as needed for moderate pain. 10 tablet 0   cefdinir (OMNICEF) 300 MG capsule Take 1 capsule (300 mg total) by mouth 2 (two) times daily. 14 capsule 0   predniSONE (DELTASONE) 20 MG tablet Take one tab by mouth twice daily for 5 days, then one daily for 4 days. Take with food. 14 tablet 0   No facility-administered medications prior to visit.    Allergies  Allergen Reactions   Clavulanic Acid Nausea And Vomiting   Sanctura [Trospium] Other (See Comments)    Dizziness    ROS Review of Systems    Objective:    Physical Exam Constitutional:      Appearance: Normal appearance. She is well-developed.  HENT:     Head: Normocephalic and atraumatic.  Cardiovascular:     Rate and Rhythm: Normal rate and regular rhythm.     Heart sounds: Normal heart sounds.  Pulmonary:     Effort: Pulmonary effort is normal.     Breath sounds: Normal breath sounds.  Skin:    General: Skin is warm and dry.  Neurological:     Mental Status: She is alert and oriented to person, place, and time.  Psychiatric:        Behavior: Behavior normal.    BP 133/84    Pulse 85    Resp 18    Ht 5' 8.5" (1.74 m)    Wt 252 lb (114.3 kg)    LMP 06/24/2013    SpO2 98%    BMI 37.76 kg/m  Wt Readings from Last 3 Encounters:  03/16/21 252 lb (114.3 kg)  12/23/20 245 lb (111.1 kg)  12/19/20 250 lb (113.4 kg)     Health Maintenance Due  Topic Date Due   Pneumococcal  Vaccine 59-70 Years old (2 - PCV) 09/02/2013    There are no preventive care reminders to display for this patient.  Lab Results  Component Value Date   TSH 1.36 11/14/2020   Lab Results  Component Value Date   WBC 8.5 11/14/2020   HGB 15.8 (H) 11/14/2020   HCT 45.4 (H) 11/14/2020   MCV 88.8 11/14/2020   PLT 292 11/14/2020   Lab Results  Component Value Date   NA 138 06/29/2020   K 4.6 06/29/2020   CO2 25 06/29/2020   GLUCOSE 125 (H) 06/29/2020   BUN 16 06/29/2020   CREATININE 0.70 06/29/2020   BILITOT 0.5 06/29/2020   ALKPHOS 58 09/14/2016   AST 17 06/29/2020   ALT 24 06/29/2020   PROT 6.7 06/29/2020   ALBUMIN 4.0 09/14/2016   CALCIUM 9.3 06/29/2020   GFR 89 11/08/2011   Lab Results  Component Value Date   CHOL 188 06/03/2019   Lab Results  Component Value Date   HDL 34 (L) 06/03/2019   Lab Results  Component Value Date   LDLCALC 113 (H) 06/03/2019   Lab Results  Component Value Date   TRIG 285 (H) 06/03/2019   Lab Results  Component Value Date   CHOLHDL 5.5 (H) 06/03/2019   Lab Results  Component Value Date   HGBA1C 6.3 (H) 06/29/2020      Assessment & Plan:   Problem List Items Addressed This Visit       Cardiovascular and Mediastinum   Essential hypertension    Pressure little borderline today but okay.  We will keep an eye on it.      Relevant Medications   chlorthalidone (HYGROTON) 25 MG tablet   Other Relevant Orders   COMPLETE METABOLIC PANEL WITH GFR   Urine Microalbumin w/creat. ratio   TSH     Other   Snoring - Primary   Relevant Orders   Home sleep test   Other Visit Diagnoses     Lower extremity edema       Relevant Orders   COMPLETE METABOLIC PANEL WITH GFR   Urine Microalbumin w/creat. ratio   TSH   Folliculitis           Snoring-we will have her complete a stop bang score today.  Score of 4 today.  Do suspect that she may have obstructive sleep apnea we discussed formal testing and a sleep lab versus a home  study. Will order home study.   Lower extremity edema-consider potential causes including recent weight gain, chronic NSAID use, increased sodium intake.  Do some labs to rule out thyroid disorder, proteinuria Cetera. Work on low salt diet.    Lesion on abdomen most consistent with an inflamed follicle.  Seems to be healing on its own.  Meds ordered this encounter  Medications   chlorthalidone (HYGROTON) 25 MG tablet    Sig: Take 1 tablet (25 mg total) by mouth daily as needed.    Dispense:  30 tablet    Refill:  0   methocarbamol (ROBAXIN) 500 MG tablet    Sig: Take 2 tablets (1,000 mg total) by mouth 2 (two) times daily as needed for muscle spasms.    Dispense:  20 tablet    Refill:  1    Follow-up: Return in about 4 weeks (around  04/13/2021).    [name], MD

## 2021-03-16 NOTE — Assessment & Plan Note (Signed)
Pressure little borderline today but okay.  We will keep an eye on it.

## 2021-03-17 LAB — COMPLETE METABOLIC PANEL WITH GFR
AG Ratio: 1.6 (calc) (ref 1.0–2.5)
ALT: 25 U/L (ref 6–29)
AST: 16 U/L (ref 10–35)
Albumin: 4.2 g/dL (ref 3.6–5.1)
Alkaline phosphatase (APISO): 58 U/L (ref 37–153)
BUN: 18 mg/dL (ref 7–25)
CO2: 27 mmol/L (ref 20–32)
Calcium: 9.5 mg/dL (ref 8.6–10.4)
Chloride: 105 mmol/L (ref 98–110)
Creat: 0.93 mg/dL (ref 0.50–1.03)
Globulin: 2.6 g/dL (calc) (ref 1.9–3.7)
Glucose, Bld: 108 mg/dL — ABNORMAL HIGH (ref 65–99)
Potassium: 4.3 mmol/L (ref 3.5–5.3)
Sodium: 140 mmol/L (ref 135–146)
Total Bilirubin: 0.3 mg/dL (ref 0.2–1.2)
Total Protein: 6.8 g/dL (ref 6.1–8.1)
eGFR: 74 mL/min/{1.73_m2} (ref >=60)

## 2021-03-17 LAB — MICROALBUMIN / CREATININE URINE RATIO
Creatinine, Urine: 130 mg/dL (ref 20–275)
Microalb Creat Ratio: 4 mcg/mg creat (ref <30)
Microalb, Ur: 0.5 mg/dL

## 2021-03-17 LAB — TSH: TSH: 1.23 mIU/L

## 2021-03-17 NOTE — Progress Notes (Signed)
Hi Brooke Kaiser, your metabolic panel looks good.  No excess protein in the urine which is good thyroid looks great.  Everything looks reassuring on your blood work.  I really think this is just coming from diet and recent weight gain.  Just encourage you to really try to decrease the soda intake and increase your water, work on eating low-salt foods.  And also try to increase activity level to reduce your BMI.  I did send over a fluid pill to take here and there as needed for swelling.  I do not want you taking it every day but you can take it a couple days in the row at the beginning just to pull some of the fluid off and see if that is helpful in the short-term.

## 2021-03-25 ENCOUNTER — Other Ambulatory Visit (HOSPITAL_COMMUNITY): Payer: Self-pay | Admitting: Psychiatry

## 2021-04-06 ENCOUNTER — Telehealth (INDEPENDENT_AMBULATORY_CARE_PROVIDER_SITE_OTHER): Payer: BC Managed Care – PPO | Admitting: Family Medicine

## 2021-04-06 DIAGNOSIS — U071 COVID-19: Secondary | ICD-10-CM

## 2021-04-06 NOTE — Progress Notes (Signed)
Called pt and lvm to let pt know that I was calling to do her prescreening. ? ?Pt reports that she tested positive yesterday. She felt achy for the past 2 days. She had been taking tylenol and Advil. She reports feeling really hot and decided to take a rapid test and it was positive. When she took her temp it was 104.  ? ?She went to a indoor concert on Saturday and believes that's when she may have been exposed. She wasn't wearing a mask during the concert. She informed the people that she was with that she tested positive.   ? ?Pt asked for a work note for 3/8 until negative test completed and then she can return to work once symptom free ?

## 2021-04-06 NOTE — Progress Notes (Signed)
? ? ?  Virtual Visit via Video Note ? ?I connected with Brooke Kaiser on 04/06/21 at  1:00 PM EST by a video enabled telemedicine application and verified that I am speaking with the correct person using two identifiers. ?  ?I discussed the limitations of evaluation and management by telemedicine and the availability of in person appointments. The patient expressed understanding and agreed to proceed. ? ?Patient location: at home ?Provider location: in office ? ?Subjective:   ? ?CC:   ?Chief Complaint  ?Patient presents with  ? Covid Positive  ? ? ?HPI: ? ?Pt reports that she tested positive yesterday. She felt achy in her joints on Tuesday and then the following day, yesterday, then ran fever to 100.4.   She had been taking tylenol and Advil. She reports feeling really hot and decided to take a rapid test and it was positive. + ST started last night and congestion. + tired.   ?  ?She went to a indoor concert on Saturday and believes that's when she may have been exposed. She wasn't wearing a mask during the concert. She informed the people that she was with that she tested positive.   ?  ?Pt asked for a work note for 3/8 until negative test completed and then she can return to work once symptom free ? ?Past medical history, Surgical history, Family history not pertinant except as noted below, Social history, Allergies, and medications have been entered into the medical record, reviewed, and corrections made.  ? ? ?Objective:   ? ?General: Speaking clearly in complete sentences without any shortness of breath.  Alert and oriented x3.  Normal judgment. No apparent acute distress. ? ? ? ?Impression and Recommendations:   ? ?Problem List Items Addressed This Visit   ?None ?Visit Diagnoses   ? ? COVID-19    -  Primary  ? ?  ? ?COVID - 19 - dicussed current quarantine guidelines.  Recommend symptomatic care. She is moderate risk. She is worried about her elderly mom who lives with her. Discussed precautions in the home.   Call if not better. Work note provided.   ? ? ?No orders of the defined types were placed in this encounter. ? ? ?No orders of the defined types were placed in this encounter. ? ? ? ?I discussed the assessment and treatment plan with the patient. The patient was provided an opportunity to ask questions and all were answered. The patient agreed with the plan and demonstrated an understanding of the instructions. ?  ?The patient was advised to call back or seek an in-person evaluation if the symptoms worsen or if the condition fails to improve as anticipated. ? ? ?[name], MD  ? ?

## 2021-04-10 ENCOUNTER — Encounter: Payer: Self-pay | Admitting: Family Medicine

## 2021-04-10 NOTE — Telephone Encounter (Signed)
OK for extended work note ?

## 2021-04-12 ENCOUNTER — Other Ambulatory Visit: Payer: Self-pay | Admitting: Family Medicine

## 2021-04-26 ENCOUNTER — Other Ambulatory Visit: Payer: Self-pay | Admitting: Family Medicine

## 2021-04-27 ENCOUNTER — Other Ambulatory Visit: Payer: Self-pay | Admitting: Psychiatry

## 2021-06-05 NOTE — Progress Notes (Signed)
? ?I, Brooke Kaiser, LAT, ATC, am serving as scribe for Dr. [name]. ? ?Brooke Kaiser is a 51 y.o. female who presents to [name] today for f/u of R 3rd finger trigger finger.  She was last seen by Dr. Georgina Snell on 08/03/20 for trigger finger and L knee pain and had a trigger finger steroid injection.  Today, pt reports R 3rd finger starts hurting again about 1 month ago. Pt notes the finger will only trigger if she makes a tight fist. Pt locates pain to the [name] heads on both sides of the hand. ? 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  This does require a lot of activity especially the right hand. ? ? ?Pertinent review of systems: No fevers or chills ? ?Relevant historical information: Hypertension. ? ? ?Exam:  ?BP 128/82   Pulse 81   Ht 5' 8.5" (1.74 m)   Wt 255 lb 3.2 oz (115.8 kg)   LMP 06/24/2013   SpO2 95%   BMI 38.24 kg/m?  ?General: Well Developed, well nourished, and in no acute distress.  ? ?MSK: Right hand: Normal. ?Tender palpation palmar third MCP.  Slight lack of full flexion at third PIP joint otherwise range of motion of the hand is intact. ?Triggering is present when full flexion of the PIP joint is forced. ?Grip strength is intact. ? ? ? ?Lab and Radiology Results ? ?Procedure: Real-time Ultrasound Guided Injection of right hand third digit A1 pulley tendon sheath (trigger finger injection) ?Device: Philips Affiniti 50G ?Images permanently stored and available for review in PACS ?Verbal informed consent obtained.  Discussed risks and benefits of procedure. Warned about infection, bleeding, hyperglycemia damage to structures among others. ?Patient expresses understanding and agreement ?Time-out conducted.   ?Noted no overlying erythema, induration, or other signs of local infection.   ?Skin prepped in a sterile fashion.   ?Local anesthesia: Topical Ethyl chloride.   ?With sterile technique and under real time  ultrasound guidance: 0.5 mL of 80 mg/mL Depo-Medrol solution and 0.5 mL of lidocaine injected into tendon sheath. Fluid seen entering the tendon sheath.   ?Completed without difficulty   ?Pain immediately resolved suggesting accurate placement of the medication.   ?Advised to call if fevers/chills, erythema, induration, drainage, or persistent bleeding.   ?Images permanently stored and available for review in the ultrasound unit.  ?Impression: Technically successful ultrasound guided injection. ? ? ? ? ? ? ? ? ?Assessment and Plan: ?51 y.o. female with right hand trigger finger.  This is a recurrence.  Last injection was July 2022.  Plan for repeat injection today as well as double Band-Aid splint.  We talked about modification of grip for her tools as well as potential surgery options in the future.  Recheck back as needed. ? ? ?PDMP not reviewed this encounter. ?Orders Placed This Encounter  ?Procedures  ? [place] LIMITED JOINT SPACE STRUCTURES UP RIGHT(NO LINKED CHARGES)  ?  Order Specific Question:   Reason for Exam (SYMPTOM  OR DIAGNOSIS REQUIRED)  ?  Answer:   R hand pain  ?  Order Specific Question:   Preferred imaging location?  ?  Answer:   [place]  ? ?No orders of the defined types were placed in this encounter. ? ? ? ?Discussed warning signs or symptoms. Please see discharge instructions. Patient expresses understanding. ? ? ?The above documentation has been reviewed and is accurate and complete  [name], M.D. ? ? ?

## 2021-06-06 ENCOUNTER — Ambulatory Visit: Payer: BC Managed Care – PPO | Admitting: Family Medicine

## 2021-06-06 ENCOUNTER — Ambulatory Visit: Payer: Self-pay

## 2021-06-06 VITALS — BP 128/82 | HR 81 | Ht 68.5 in | Wt 255.2 lb

## 2021-06-06 DIAGNOSIS — L0100 Impetigo, unspecified: Secondary | ICD-10-CM | POA: Diagnosis not present

## 2021-06-06 DIAGNOSIS — L578 Other skin changes due to chronic exposure to nonionizing radiation: Secondary | ICD-10-CM | POA: Diagnosis not present

## 2021-06-06 DIAGNOSIS — M79641 Pain in right hand: Secondary | ICD-10-CM

## 2021-06-06 DIAGNOSIS — C4402 Squamous cell carcinoma of skin of lip: Secondary | ICD-10-CM | POA: Diagnosis not present

## 2021-06-06 DIAGNOSIS — M65331 Trigger finger, right middle finger: Secondary | ICD-10-CM

## 2021-06-06 DIAGNOSIS — L918 Other hypertrophic disorders of the skin: Secondary | ICD-10-CM | POA: Diagnosis not present

## 2021-06-06 DIAGNOSIS — L821 Other seborrheic keratosis: Secondary | ICD-10-CM | POA: Diagnosis not present

## 2021-06-06 NOTE — Patient Instructions (Addendum)
Thank you for coming in today.  ? ?Please get an Xray today before you leave  ? ?You received a steroid injection in your right hand today. Seek immediate medical attention if the joint becomes red, extremely painful, or is oozing fluid.  ? ?Let me know if this is not getting better. ?

## 2021-06-07 ENCOUNTER — Encounter: Payer: Self-pay | Admitting: Family Medicine

## 2021-06-07 ENCOUNTER — Telehealth: Payer: Self-pay | Admitting: Family Medicine

## 2021-06-07 NOTE — Telephone Encounter (Signed)
Received the following message: ?Maybe steroid flare? Does pt need to be seen or do you just want to call her?  ?----- Message -----  ?From: Karie Soda  ?Sent: 06/07/2021   7:36 AM EDT  ?To: Wendy Poet, LAT, Mare Ferrari  ?Subject: FW: Appointment Scheduled                      ?  ?  ?----- Message -----  ?From: Sarina Ser F  ?Sent: 06/07/2021   7:12 AM EDT  ?To: [place]  ?Subject: Appointment Scheduled                          ?  ?I had my appointment yesterday and today my finger is swollen and in worse pain than yesterday.  I cannot hold a shovel so I had to call out of work.    ?Can yall fit me in today?  ? ? ?Suspected steroid flare.  I called the patient back.  Work note written.  Recommend ibuprofen and Tylenol.  Offered narcotics which she declined which I think is reasonable.  Watchful waiting.  We will schedule in later today if needed or tomorrow if needed although I think this will probably be self-limiting. ?

## 2021-06-12 ENCOUNTER — Other Ambulatory Visit: Payer: Self-pay | Admitting: Psychiatry

## 2021-06-13 ENCOUNTER — Telehealth (INDEPENDENT_AMBULATORY_CARE_PROVIDER_SITE_OTHER): Payer: Self-pay | Admitting: Psychiatry

## 2021-06-13 ENCOUNTER — Encounter (HOSPITAL_COMMUNITY): Payer: Self-pay | Admitting: Psychiatry

## 2021-06-13 DIAGNOSIS — F331 Major depressive disorder, recurrent, moderate: Secondary | ICD-10-CM

## 2021-06-13 DIAGNOSIS — F411 Generalized anxiety disorder: Secondary | ICD-10-CM

## 2021-06-13 MED ORDER — VENLAFAXINE HCL ER 75 MG PO CP24: 225.0000 mg | ORAL_CAPSULE | Freq: Every day | ORAL | 0 refills | Status: DC

## 2021-06-13 MED ORDER — LAMOTRIGINE 100 MG PO TABS: ORAL_TABLET | ORAL | 0 refills | Status: DC

## 2021-06-13 NOTE — Progress Notes (Signed)
Patient ID: Brooke Kaiser, female   DOB: [birth year], 51 y.o.   MRN: [id number] ?[facility] MD Progress Note ? ?06/13/2021 10:09 AM ?Darliss Cheney Stankey  ?MRN:  [id number]  ? ?Virtual Visit via Video Note ? ?I connected with Tashauna Caisse Cureton on 06/13/21 at 10:00 AM EDT by a video enabled telemedicine application and verified that I am speaking with the correct person using two identifiers. ? ?Location: ?Patient: car ?Provider: office ?  ?I discussed the limitations of evaluation and management by telemedicine and the availability of in person appointments. The patient expressed understanding and agreed to proceed. ? ? ?  ?I discussed the assessment and treatment plan with the patient. The patient was provided an opportunity to ask questions and all were answered. The patient agreed with the plan and demonstrated an understanding of the instructions. ?  ?The patient was advised to call back or seek an in-person evaluation if the symptoms worsen or if the condition fails to improve as anticipated. ? ?I provided 15 minutes of non-face-to-face time during this encounter. ? ? ?Merian Capron, MD  ? ?HPI; doing fair, likes her job, conflicts with mom at home adds anger otherwise satisfied with meds ?No rash ? ?effexor helps anxiety ?Modifying factor: job ? ?Aggravating factor: mom sickness ?Severity of depression: manageable ?No rash ? ?No associated psychotic symptoms or mania. ? ?Principal Problem: GAD and MDD ?Diagnosis:   ?Patient Active Problem List  ? Diagnosis Date Noted  ? Snoring [R06.83] 03/16/2021  ? Trigger middle finger of right hand [M65.331] 08/03/2020  ? Elevated hemoglobin ([place]) [D58.2] 03/04/2018  ? Vitamin D deficiency [E55.9] 03/04/2018  ? Gouty arthritis [M10.9] 11/04/2013  ? ANA positive [R76.8] 09/22/2013  ? Muscle ache [M79.10] 09/22/2013  ? Collagen disease ([place]) [M35.9] 09/22/2013  ? Elevated erythrocyte sedimentation rate [R70.0] 09/22/2013  ? IFG (impaired fasting glucose) [R73.01] 06/24/2012  ? Asthmatic  bronchitis [J45.909] 10/16/2011  ? GAD (generalized anxiety disorder) [F41.1] 07/30/2011  ? Essential hypertension [I10] 04/07/2010  ? UNSPECIFIED ANEMIA [D64.9] 03/29/2010  ? Severe episode of recurrent major depressive disorder ([place]) [F33.2] 07/22/2009  ? OTHER ABNORMAL GLUCOSE [R73.09] 05/25/2008  ? HYPERTRIGLYCERIDEMIA [E78.1] 11/04/2007  ? FATIGUE [R53.81, R53.83] 11/04/2007  ? EXTERNAL HEMORRHOIDS [K64.4] 07/22/2007  ? BACK PAIN [M54.9] 07/22/2007  ? TOBACCO ABUSE [F17.200] 07/01/2007  ? GERD [K21.9] 07/01/2007  ? ABNORMAL THYROID FUNCTION TESTS [R94.6] 04/22/2007  ? ?Total Time spent with patient: 25 minutes ? ? ?Past Medical History:  ?Past Medical History:  ?Diagnosis Date  ? Abnormal thyroid blood test   ? Anemia   ? Anxiety   ? Depression   ? Hypertension   ? Tobacco abuse   ?  ?Past Surgical History:  ?Procedure Laterality Date  ? 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 ?Family History:  ?Family History  ?Problem Relation Age of Onset  ? Cancer Mother 62  ?     Breast  ? Colon polyps Mother   ? Other Mother   ?     colon polyps  ? Dementia Mother   ? Anxiety disorder Mother   ? Depression Mother   ? Paranoid behavior Mother   ? Depression Father   ? Suicidality Father   ?     commited suicide  ? Alcohol abuse Father   ? Colon polyps Maternal Aunt   ?     cancerous colon polyps  ? Cancer Maternal Uncle   ?     cancerous  colon polyps  ? Heart disease Other   ? Other Other   ?     thyroid disorder  ? Alcohol abuse Paternal Uncle   ? Dementia Maternal Grandmother   ? Alcohol abuse Paternal Grandmother   ? Alcohol abuse Paternal Uncle   ? ?Social History:  ?Social History  ? ?Substance and Sexual Activity  ?Alcohol Use No  ? Alcohol/week: 0.3 standard drinks  ?   ?Social History  ? ?Substance and Sexual Activity  ?Drug Use No  ?  ?Social History  ? ?Socioeconomic History  ? Marital status: Single  ?  Spouse name: Not on file  ? Number of children: Not on file  ? Years of education: Not  on file  ? Highest education level: Not on file  ?Occupational History  ? Occupation: [employer]  ?  Employer: [employer]  ?Tobacco Use  ? Smoking status: Every Day  ?  Packs/day: 1.00  ?  Years: 27.00  ?  Pack years: 27.00  ?  Types: Cigarettes  ? Smokeless tobacco: Never  ?Vaping Use  ? Vaping Use: Never used  ?Substance and Sexual Activity  ? Alcohol use: No  ?  Alcohol/week: 0.3 standard drinks  ? Drug use: No  ? Sexual activity: Yes  ?  Partners: Female  ?  Comment: prefers female sex partner  ?Other Topics Concern  ? Not on file  ?Social History Narrative  ? Not on file  ? ?Social Determinants of Health  ? ?Financial Resource Strain: Not on file  ?Food Insecurity: Not on file  ?Transportation Needs: Not on file  ?Physical Activity: Not on file  ?Stress: Not on file  ?Social Connections: Not on file  ? ?Additional History:   ? ?Sleep: Fair ? ?Appetite:  Fair ? ? ?Assessment: GAD stable, MDD stable  ? ? ? ? ?Psychiatric Specialty Exam: ?Physical Exam  ?Review of Systems  ?Cardiovascular:  Negative for chest pain.  ?Skin:  Negative for rash.  ?Psychiatric/Behavioral:  Positive for depression. Negative for hallucinations and suicidal ideas.    ?Last menstrual period 06/24/2013.There is no height or weight on file to calculate BMI.  ?General Appearance: Neat and Well Groomed  ?Eye Contact::  Good  ?Speech:  Clear and Coherent  ?Volume:  Normal  ?Mood: Fair  ?Affect:  congruent  ?Thought Process:  Goal Directed, Intact, Linear and Logical  ?Orientation:  Full (Time, Place, and Person)  ?Thought Content:  WDL  ?Suicidal Thoughts:  No  ?Homicidal Thoughts:  No  ?Memory:  Immediate;   Good ?Recent;   Good ?Remote;   Good  ?Judgement:  Good  ?Insight:  Present  ?Psychomotor Activity:  Normal  ?Concentration:  Good  ?Recall:  Good  ?[employer] [place]  ?Language: Good  ?Akathisia:  No  ?Handed:  Right  ?AIMS (if indicated):     ?Assets:  Communication Skills ?Desire for  Improvement ?Housing ?Intimacy ?Leisure Time ?Physical Health ?Resilience ?Social Support ?Talents/Skills ?Transportation ?Vocational/Educational  ?ADL's:  Intact  ?Cognition: WNL  ?Sleep:  Fair  Notes she is sleeping during the day.  ? ? ? ?Current Medications: ?Current Outpatient Prescriptions  ?Medication Sig Dispense Refill  ? lamoTRIgine (LAMICTAL) 100 MG tablet Take one tablet at day for mood stabilization. 30 tablet 1  ? losartan-hydrochlorothiazide (HYZAAR) 100-25 MG per tablet Take 1 tablet by mouth daily. 30 tablet 5  ? venlafaxine XR (EFFEXOR-XR) 75 MG 24 hr capsule Take 3 capsules (225 mg total) by mouth daily with breakfast.  90 capsule 0  ? VENTOLIN HFA 108 (90 BASE) MCG/ACT inhaler INHALE 4 PUFFS INTO THE LUNGS 2 TIMES DAILY AS NEEDED. 18 Inhaler 3  ? ?No current facility-administered medications for this visit.  ? ? ?Lab Results:  ?No results found for this or any previous visit (from the past 48 hour(s)). ? ?Physical Findings: ?AIMS: ?CIWA:   ?COWS:   ? ?Treatment Plan Summary: ?Medication management ? ? ?Prior documentation reviewed ? ?MDD : fair continue lamictal ? ? ?Mood disorder: See above  ?GAD: fair continue effexor, discussed therapy options to deal with mom, she may call to schedyule ? ?Fu 75m?Renewed meds ? ? ?NMerian Capron ?10:09 AM ?06/13/2021 ? ?

## 2021-06-27 ENCOUNTER — Ambulatory Visit (INDEPENDENT_AMBULATORY_CARE_PROVIDER_SITE_OTHER): Payer: BC Managed Care – PPO

## 2021-06-27 ENCOUNTER — Encounter: Payer: Self-pay | Admitting: Family Medicine

## 2021-06-27 ENCOUNTER — Ambulatory Visit: Payer: BC Managed Care – PPO | Admitting: Family Medicine

## 2021-06-27 VITALS — BP 134/78 | HR 73 | Resp 18 | Ht 68.5 in | Wt 252.0 lb

## 2021-06-27 DIAGNOSIS — G8929 Other chronic pain: Secondary | ICD-10-CM | POA: Diagnosis not present

## 2021-06-27 DIAGNOSIS — Z8049 Family history of malignant neoplasm of other genital organs: Secondary | ICD-10-CM | POA: Diagnosis not present

## 2021-06-27 DIAGNOSIS — Z803 Family history of malignant neoplasm of breast: Secondary | ICD-10-CM | POA: Diagnosis not present

## 2021-06-27 DIAGNOSIS — Z1231 Encounter for screening mammogram for malignant neoplasm of breast: Secondary | ICD-10-CM | POA: Diagnosis not present

## 2021-06-27 DIAGNOSIS — R1031 Right lower quadrant pain: Secondary | ICD-10-CM

## 2021-06-27 DIAGNOSIS — Z800 Family history of malignant neoplasm of digestive organs: Secondary | ICD-10-CM | POA: Diagnosis not present

## 2021-06-27 DIAGNOSIS — M5137 Other intervertebral disc degeneration, lumbosacral region: Secondary | ICD-10-CM | POA: Diagnosis not present

## 2021-06-27 DIAGNOSIS — M545 Low back pain, unspecified: Secondary | ICD-10-CM

## 2021-06-27 DIAGNOSIS — Z801 Family history of malignant neoplasm of trachea, bronchus and lung: Secondary | ICD-10-CM | POA: Diagnosis not present

## 2021-06-27 DIAGNOSIS — Z1379 Encounter for other screening for genetic and chromosomal anomalies: Secondary | ICD-10-CM | POA: Diagnosis not present

## 2021-06-27 LAB — POCT URINALYSIS DIP (CLINITEK)
Bilirubin, UA: NEGATIVE
Blood, UA: NEGATIVE
Glucose, UA: NEGATIVE mg/dL
Ketones, POC UA: NEGATIVE mg/dL
Leukocytes, UA: NEGATIVE
Nitrite, UA: NEGATIVE
POC PROTEIN,UA: NEGATIVE
Spec Grav, UA: 1.015 (ref 1.010–1.025)
Urobilinogen, UA: 0.2 E.U./dL
pH, UA: 7 (ref 5.0–8.0)

## 2021-06-27 NOTE — Progress Notes (Addendum)
G lum  Acute Office Visit  Subjective:     Patient ID: Brooke Kaiser, female    DOB: 25-Aug-1970, 51 y.o.   MRN: [id number]  Chief Complaint  Patient presents with   Back Pain    Patient complains of low back pain/right side for 2 months.   Gastroesophageal Reflux    Patient alternating between Omeprazole and Tagamet.      HPI Patient is in today for back pain x 2.5 mo. she says that she has pain across her low back but it is worse in that right low back area towards the buttocks.  And for about the last month she has noticed pain may be a feeling like it some is going from her back into the front abdominal area.  She says it almost feels like when you are having your period.  Though she has not had a period in years.  She still has her ovaries.  No recent bleeding.  She says her back hurts with lots of different motions she does not member any specific injury or trauma but she does have a more physical job.  She has been using 100 mg ibuprofen, 1000 mg Tylenol, lidocaine patches and stretches.  She is also interested in BRCA testing.  She has a maternal uncle who died from colon cancer and her mom had cervical and breast cancer.  She is a current smoker.   ROS      Objective:    BP 134/78   Pulse 73   Resp 18   Ht 5' 8.5" (1.74 m)   Wt 252 lb (114.3 kg)   LMP 06/24/2013   SpO2 97%   BMI 37.76 kg/m    Physical Exam Vitals and nursing note reviewed.  Constitutional:      Appearance: She is well-developed.  HENT:     Head: Normocephalic and atraumatic.  Eyes:     Conjunctiva/sclera: Conjunctivae normal.  Pulmonary:     Effort: Pulmonary effort is normal.  Abdominal:     General: Bowel sounds are normal. There is no distension.     Tenderness: There is abdominal tenderness.     Comments: Mildly tender in the right lower quadrant.  Musculoskeletal:     Comments: Nontender over the lumbar spine or SI joints.  Normal flexion, extension, rotation right and left and  sidebending.  Skin:    General: Skin is warm and dry.     Coloration: Skin is not pale.  Neurological:     Mental Status: She is alert and oriented to person, place, and time.  Psychiatric:        Mood and Affect: Mood normal.        Behavior: Behavior normal.    Results for orders placed or performed in visit on 06/27/21  POCT URINALYSIS DIP (CLINITEK)  Result Value Ref Range   Color, UA yellow yellow   Clarity, UA clear clear   Glucose, UA negative negative mg/dL   Bilirubin, UA negative negative   Ketones, POC UA negative negative mg/dL   Spec Grav, UA 1.015 1.010 - 1.025   Blood, UA negative negative   pH, UA 7.0 5.0 - 8.0   POC PROTEIN,UA negative negative, trace   Urobilinogen, UA 0.2 0.2 or 1.0 E.U./dL   Nitrite, UA Negative Negative   Leukocytes, UA Negative Negative        Assessment & Plan:   Problem List Items Addressed This Visit   None Visit Diagnoses  Encounter for screening mammogram for malignant neoplasm of breast    -  Primary   Relevant Orders   MM 3D SCREEN BREAST BILATERAL   Acute right-sided low back pain without sciatica       Relevant Orders   DG Lumbar Spine Complete   POCT URINALYSIS DIP (CLINITEK) (Completed)   RLQ abdominal pain       Relevant Orders   US Pelvic Complete With Transvaginal   Genetic testing of female           Acute right-sided low back pain-since it hurts had been going on for couple months despite ibuprofen and Tylenol stretches and lidocaine patches we will move forward with plain film x-ray.  Call with results once available.  I am also concerned that she is having some right lower quadrant pain which could be related.  Right lower quadrant pain-we will start with pelvic ultrasound for further work-up.  Genetic testing-we discussed that BRCA testing could be an option.  We offer the test through [facility].  Mother with breast cancer, maternal uncle with colon cancer, maternal aunt with colon cancer.  No  orders of the defined types were placed in this encounter.   No follow-ups on file.  [name], MD

## 2021-06-29 NOTE — Progress Notes (Signed)
Hi Keli, you do have some moderate arthritis and disc disease at L5-S1.  I would like to start with formal physical therapy for your back.  That usually our first step for arthritis and see if we can help strengthen the muscles around your spine as well as reduce your pain and dysfunction.  If you are okay with doing PT here in our building please let us know.  If there is a place closer to home that you would prefer to go to the please let us know.

## 2021-07-03 ENCOUNTER — Ambulatory Visit (INDEPENDENT_AMBULATORY_CARE_PROVIDER_SITE_OTHER): Payer: BC Managed Care – PPO

## 2021-07-03 DIAGNOSIS — Z780 Asymptomatic menopausal state: Secondary | ICD-10-CM | POA: Diagnosis not present

## 2021-07-03 DIAGNOSIS — Z8049 Family history of malignant neoplasm of other genital organs: Secondary | ICD-10-CM | POA: Diagnosis not present

## 2021-07-03 DIAGNOSIS — D251 Intramural leiomyoma of uterus: Secondary | ICD-10-CM | POA: Diagnosis not present

## 2021-07-03 DIAGNOSIS — Z8541 Personal history of malignant neoplasm of cervix uteri: Secondary | ICD-10-CM | POA: Diagnosis not present

## 2021-07-03 DIAGNOSIS — Z803 Family history of malignant neoplasm of breast: Secondary | ICD-10-CM

## 2021-07-03 DIAGNOSIS — R1031 Right lower quadrant pain: Secondary | ICD-10-CM

## 2021-07-03 DIAGNOSIS — D252 Subserosal leiomyoma of uterus: Secondary | ICD-10-CM | POA: Diagnosis not present

## 2021-07-05 ENCOUNTER — Other Ambulatory Visit: Payer: Self-pay | Admitting: *Deleted

## 2021-07-05 DIAGNOSIS — M51379 Other intervertebral disc degeneration, lumbosacral region without mention of lumbar back pain or lower extremity pain: Secondary | ICD-10-CM

## 2021-07-05 DIAGNOSIS — M5137 Other intervertebral disc degeneration, lumbosacral region: Secondary | ICD-10-CM

## 2021-07-05 DIAGNOSIS — M545 Low back pain, unspecified: Secondary | ICD-10-CM

## 2021-07-05 NOTE — Progress Notes (Signed)
Hi Brooke Kaiser, sorry for the delay in getting back to you on your test results.  The ultrasound shows a leiomyomata in the uterus measuring about 2 cm.  Is like a growth in the wall of the use her uterus but these are usually benign.  On your right ovary you do have a fairly large cyst measuring about 9.8 cm and a smaller cyst on your left ovary measuring about 3 cm.  Both of them are smaller than they were previously in 2019 which is actually very reassuring that these are benign lesions.  Meaning there highly unlikely to be cancer.  I think the next step if you are still having a lot of discomfort and cramping is to get you in with OB/GYN.  Let us know if you are okay for [place] to place referral.

## 2021-07-24 ENCOUNTER — Other Ambulatory Visit: Payer: Self-pay | Admitting: Family Medicine

## 2021-08-09 ENCOUNTER — Ambulatory Visit (INDEPENDENT_AMBULATORY_CARE_PROVIDER_SITE_OTHER): Payer: BC Managed Care – PPO

## 2021-08-09 DIAGNOSIS — Z1231 Encounter for screening mammogram for malignant neoplasm of breast: Secondary | ICD-10-CM | POA: Diagnosis not present

## 2021-08-11 NOTE — Progress Notes (Signed)
Please call patient. Normal mammogram.  Repeat in 1 year.  

## 2021-09-08 ENCOUNTER — Other Ambulatory Visit (HOSPITAL_COMMUNITY): Payer: Self-pay | Admitting: Psychiatry

## 2021-10-23 ENCOUNTER — Other Ambulatory Visit (HOSPITAL_COMMUNITY): Payer: Self-pay

## 2021-10-23 MED ORDER — VENLAFAXINE HCL ER 75 MG PO CP24: 225.0000 mg | ORAL_CAPSULE | Freq: Every day | ORAL | 0 refills | Status: DC

## 2021-11-21 ENCOUNTER — Other Ambulatory Visit: Payer: Self-pay

## 2021-11-21 ENCOUNTER — Emergency Department (HOSPITAL_BASED_OUTPATIENT_CLINIC_OR_DEPARTMENT_OTHER): Payer: BC Managed Care – PPO | Admitting: Radiology

## 2021-11-21 ENCOUNTER — Emergency Department (HOSPITAL_BASED_OUTPATIENT_CLINIC_OR_DEPARTMENT_OTHER)
Admission: EM | Admit: 2021-11-21 | Discharge: 2021-11-21 | Disposition: A | Discharge status: Home / Self Care | Payer: BC Managed Care – PPO | Attending: Emergency Medicine | Admitting: Emergency Medicine

## 2021-11-21 DIAGNOSIS — S39012A Strain of muscle, fascia and tendon of lower back, initial encounter: Secondary | ICD-10-CM | POA: Insufficient documentation

## 2021-11-21 DIAGNOSIS — M549 Dorsalgia, unspecified: Secondary | ICD-10-CM | POA: Diagnosis not present

## 2021-11-21 DIAGNOSIS — T148XXA Other injury of unspecified body region, initial encounter: Secondary | ICD-10-CM

## 2021-11-21 DIAGNOSIS — X58XXXA Exposure to other specified factors, initial encounter: Secondary | ICD-10-CM | POA: Insufficient documentation

## 2021-11-21 DIAGNOSIS — R079 Chest pain, unspecified: Secondary | ICD-10-CM | POA: Diagnosis not present

## 2021-11-21 DIAGNOSIS — R109 Unspecified abdominal pain: Secondary | ICD-10-CM | POA: Diagnosis not present

## 2021-11-21 LAB — URINALYSIS, ROUTINE W REFLEX MICROSCOPIC
Bilirubin Urine: NEGATIVE
Glucose, UA: NEGATIVE mg/dL
Hgb urine dipstick: NEGATIVE
Ketones, ur: NEGATIVE mg/dL
Leukocytes,Ua: NEGATIVE
Nitrite: NEGATIVE
Protein, ur: NEGATIVE mg/dL
Specific Gravity, Urine: 1.013 (ref 1.005–1.030)
pH: 6 (ref 5.0–8.0)

## 2021-11-21 LAB — COMPREHENSIVE METABOLIC PANEL
ALT: 35 U/L (ref 0–44)
AST: 20 U/L (ref 15–41)
Albumin: 4.3 g/dL (ref 3.5–5.0)
Alkaline Phosphatase: 47 U/L (ref 38–126)
Anion gap: 9 (ref 5–15)
BUN: 14 mg/dL (ref 6–20)
CO2: 26 mmol/L (ref 22–32)
Calcium: 9.7 mg/dL (ref 8.9–10.3)
Chloride: 103 mmol/L (ref 98–111)
Creatinine, Ser: 0.7 mg/dL (ref 0.44–1.00)
GFR, Estimated: >60 mL/min (ref >60)
Glucose, Bld: 162 mg/dL — ABNORMAL HIGH (ref 70–99)
Potassium: 4 mmol/L (ref 3.5–5.1)
Sodium: 138 mmol/L (ref 135–145)
Total Bilirubin: 0.3 mg/dL (ref 0.3–1.2)
Total Protein: 6.6 g/dL (ref 6.5–8.1)

## 2021-11-21 LAB — CBC WITH DIFFERENTIAL/PLATELET
Abs Immature Granulocytes: 0.02 10*3/uL (ref 0.00–0.07)
Basophils Absolute: 0 10*3/uL (ref 0.0–0.1)
Basophils Relative: 0 %
Eosinophils Absolute: 0.4 10*3/uL (ref 0.0–0.5)
Eosinophils Relative: 4 %
HCT: 42.5 % (ref 36.0–46.0)
Hemoglobin: 14.3 g/dL (ref 12.0–15.0)
Immature Granulocytes: 0 %
Lymphocytes Relative: 34 %
Lymphs Abs: 3 10*3/uL (ref 0.7–4.0)
MCH: 29.9 pg (ref 26.0–34.0)
MCHC: 33.6 g/dL (ref 30.0–36.0)
MCV: 88.7 fL (ref 80.0–100.0)
Monocytes Absolute: 0.6 10*3/uL (ref 0.1–1.0)
Monocytes Relative: 7 %
Neutro Abs: 4.7 10*3/uL (ref 1.7–7.7)
Neutrophils Relative %: 55 %
Platelets: 270 10*3/uL (ref 150–400)
RBC: 4.79 MIL/uL (ref 3.87–5.11)
RDW: 13.2 % (ref 11.5–15.5)
WBC: 8.7 10*3/uL (ref 4.0–10.5)
nRBC: 0 % (ref 0.0–0.2)

## 2021-11-21 LAB — PREGNANCY, URINE: Preg Test, Ur: NEGATIVE

## 2021-11-21 MED ORDER — CYCLOBENZAPRINE HCL 5 MG PO TABS
5.0000 mg | ORAL_TABLET | Freq: Once | ORAL | Status: AC
  Administered 2021-11-21: 5 mg via ORAL
  Filled 2021-11-21: qty 1

## 2021-11-21 MED ORDER — LIDOCAINE 5 % EX PTCH: 1.0000 | MEDICATED_PATCH | CUTANEOUS | 0 refills | Status: DC

## 2021-11-21 MED ORDER — ACETAMINOPHEN 500 MG PO TABS
1000.0000 mg | ORAL_TABLET | Freq: Once | ORAL | Status: AC
  Administered 2021-11-21: 1000 mg via ORAL
  Filled 2021-11-21: qty 2

## 2021-11-21 MED ORDER — LIDOCAINE 5 % EX PTCH
2.0000 | MEDICATED_PATCH | CUTANEOUS | Status: DC
  Administered 2021-11-21: 2 via TRANSDERMAL
  Filled 2021-11-21: qty 2

## 2021-11-21 MED ORDER — CYCLOBENZAPRINE HCL 5 MG PO TABS: 5.0000 mg | ORAL_TABLET | Freq: Three times a day (TID) | ORAL | 0 refills | Status: DC | PRN

## 2021-11-21 NOTE — ED Provider Notes (Signed)
[facility] DEPT Provider Note   CSN: [id number] Arrival date & time: 11/21/21  1325     History  Chief Complaint  Patient presents with   Flank Pain    Brooke Kaiser is a 51 y.o. female, who presents to the ED secondary to bilateral flank/back pain for the last 72 hours.  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  Denies any trauma.  Describes the pain as aching, 8 out of 10, improved with ibuprofen.  With improvement ibuprofen pain is 4 out of 10.  States it hurts more with movement, turning, and is improved with heat.  No history of kidney stones urinary tract infections.  Denies any nausea, vomiting, shortness of breath, urinary frequency, urgency.  Is postmenopausal, has no concern for STDs.  Denies any chest pain.     Home Medications Prior to Admission medications   Medication Sig Start Date End Date Taking? Authorizing Provider  cyclobenzaprine (FLEXERIL) 5 MG tablet Take 1 tablet (5 mg total) by mouth 3 (three) times daily as needed for muscle spasms. 11/21/21  Yes Shaneece Stockburger L, PA  lidocaine (LIDODERM) 5 % Place 1 patch onto the skin daily. Remove & Discard patch within 12 hours or as directed by MD 11/21/21  Yes Nechuma Boven L, PA  albuterol (VENTOLIN HFA) 108 (90 Base) MCG/ACT inhaler INHALE 2-4 PUFFS INTO THE LUNGS 2 TIMES DAILY AS NEEDED. 07/25/21   Hali Marry, MD  chlorthalidone (HYGROTON) 25 MG tablet TAKE 1 TABLET BY MOUTH EVERY DAY AS NEEDED 04/27/21   Hali Marry, MD  ibuprofen (ADVIL) 600 MG tablet Take 1 tablet (600 mg total) by mouth every 6 (six) hours as needed. Patient taking differently: Take 800 mg by mouth every 6 (six) hours as needed. 01/08/21   Wynona Dove A, DO  lamoTRIgine (LAMICTAL) 100 MG tablet TAKE 1 TABLET BY MOUTH EVERY DAY 09/08/21   Merian Capron, MD  losartan (COZAAR) 100 MG tablet TAKE 1 TABLET BY MOUTH EVERY DAY 07/25/21   Hali Marry, MD  venlafaxine XR (EFFEXOR-XR) 75 MG 24 hr capsule Take 3 capsules (225 mg total) by mouth daily. 10/23/21   Merian Capron, MD      Allergies    Clavulanic acid and [place] [trospium]    Review of Systems   Review of Systems  Genitourinary:  Positive for flank pain. Negative for difficulty urinating, dysuria, hematuria and pelvic pain.  Musculoskeletal:  Positive for back pain.    Physical Exam Updated Vital Signs BP (!) 143/73 (BP Location: Left Arm)   Pulse 79   Temp 98.4 F (36.9 C) (Oral)   Resp 16   Ht 5' 8.5" (1.74 m)   Wt 113.4 kg   LMP 06/24/2013   SpO2 98%   BMI 37.46 kg/m  Physical Exam Vitals and nursing note reviewed.  Constitutional:      General: She is not in acute distress.    Appearance: She is well-developed.  HENT:     Head: Normocephalic and atraumatic.  Eyes:     Conjunctiva/sclera: Conjunctivae normal.  Cardiovascular:     Rate and Rhythm: Normal rate and regular rhythm.     Heart sounds: No murmur heard. Pulmonary:     Effort: Pulmonary effort is normal. No respiratory distress.     Breath sounds: Normal breath sounds.  Abdominal:     Palpations: Abdomen is soft.  Tenderness: There is no abdominal tenderness.  Musculoskeletal:        General: No swelling.     Cervical back: Neck supple.     Comments: +mild ttp of bilateral flanks, paraspinal muscles of thoracic and lumbar spine, worse with truncal rotation, no CVA ttp.  Skin:    General: Skin is warm and dry.     Capillary Refill: Capillary refill takes less than 2 seconds.  Neurological:     Mental Status: She is alert.  Psychiatric:        Mood and Affect: Mood normal.     ED Results / Procedures / Treatments   Labs (all labs ordered are listed, but only abnormal results are displayed) Labs Reviewed  COMPREHENSIVE METABOLIC PANEL - Abnormal; Notable for the following components:      Result Value   Glucose, Bld 162 (*)    All other components within normal  limits  URINALYSIS, ROUTINE W REFLEX MICROSCOPIC  PREGNANCY, URINE  CBC WITH DIFFERENTIAL/PLATELET    EKG None  Radiology DG Chest 2 View  Result Date: 11/21/2021 CLINICAL DATA:  Bilateral upper back pain.  Bilateral flank pain. EXAM: CHEST - 2 VIEW COMPARISON:  12/23/2020. FINDINGS: Cardiac silhouette is normal in size. Normal mediastinal and hilar contours. Stable surgical vascular clips project posterior to the aortic knob. Clear lungs.  No pleural effusion or pneumothorax. Skeletal structures are intact. IMPRESSION: No active cardiopulmonary disease. Electronically Signed   By: Lajean Manes M.D.   On: 11/21/2021 15:46    Procedures Procedures    Medications Ordered in ED Medications  lidocaine (LIDODERM) 5 % 2 patch (2 patches Transdermal Patch Applied 11/21/21 1557)  cyclobenzaprine (FLEXERIL) tablet 5 mg (5 mg Oral Given 11/21/21 1557)  acetaminophen (TYLENOL) tablet 1,000 mg (1,000 mg Oral Given 11/21/21 1557)    ED Course/ Medical Decision Making/ A&P                           Medical Decision Making Patient is a 51 year old female, here for pain to bilateral flanks, back occurred after digging a hole on Friday.  States pain is worse with rotation.  Her symptoms are compatible with muscle strain, she is concerned due to her kidneys and lungs, she would like further evaluation. Will obtain CBC, CMP, CXR, UA for further ealuation of bilateral flank/upper back pain.   Amount and/or Complexity of Data Reviewed Labs: ordered.    Details: No leukocytosis, Cr WNL Radiology: ordered.    Details: CXR clear Discussion of management or test interpretation with external provider(s): Reviewed w/pt, no acute findings. Pain greatly improved with flexeril, tylenol, and lidocaine patch.  Symptoms aligned with a back strain, likely secondary to patient's very physical job.  Discharged with Flexeril, lidocaine patches, and return precautions.  She voiced understanding.  Risk OTC  drugs. Prescription drug management.    Final Clinical Impression(s) / ED Diagnoses Final diagnoses:  Muscle strain  Acute bilateral back pain, unspecified back location    Rx / DC Orders ED Discharge Orders          Ordered    cyclobenzaprine (FLEXERIL) 5 MG tablet  3 times daily PRN        11/21/21 1728    lidocaine (LIDODERM) 5 %  Every 24 hours        11/21/21 1728              Joee Iovine L, [place] 11/21/21 1732  Margette Fast, MD 11/22/21 1130

## 2021-11-21 NOTE — ED Triage Notes (Signed)
Pt arrived POV. Pt caox4 and ambulatory. Pt c/o bilateral flank pain since Friday night.   Pt denies hematuria, change in urinary frequency, and pain on urination. Pt denies N/V/D.  Denies hx kidney stones.   Last 800 mg Ibuprofen taken around 8am today.

## 2021-11-21 NOTE — Discharge Instructions (Signed)
Please follow-up with your primary care doctor as needed.  Take ibuprofen Tylenol for pain.  Do not use muscle relaxers while driving or operating heavy machinery.  Please return to the ER if you develop fever, chills, worsening pain, numbness down either of your legs or arms, or loss of bowel or bladder.

## 2021-11-22 ENCOUNTER — Telehealth: Payer: Self-pay | Admitting: General Practice

## 2021-11-22 NOTE — Telephone Encounter (Signed)
Transition Care Management Unsuccessful Follow-up Telephone Call  Date of discharge and from where:  11/21/21 from [facility]  Attempts:  1st Attempt  Reason for unsuccessful TCM follow-up call:  No answer/busy

## 2021-11-24 NOTE — Telephone Encounter (Signed)
Transition Care Management Unsuccessful Follow-up Telephone Call  Date of discharge and from where:  11/21/21 from [facility]  Attempts:  2nd Attempt  Reason for unsuccessful TCM follow-up call:  No answer/busy

## 2021-11-27 ENCOUNTER — Encounter: Payer: Self-pay | Admitting: Family Medicine

## 2021-11-27 ENCOUNTER — Ambulatory Visit (INDEPENDENT_AMBULATORY_CARE_PROVIDER_SITE_OTHER): Payer: BC Managed Care – PPO | Admitting: Family Medicine

## 2021-11-27 VITALS — BP 140/75 | HR 87 | Ht 68.5 in | Wt 255.0 lb

## 2021-11-27 DIAGNOSIS — M545 Low back pain, unspecified: Secondary | ICD-10-CM | POA: Diagnosis not present

## 2021-11-27 MED ORDER — KETOROLAC TROMETHAMINE 60 MG/2ML IM SOLN
60.0000 mg | Freq: Once | INTRAMUSCULAR | Status: AC
  Administered 2021-11-27: 60 mg via INTRAMUSCULAR

## 2021-11-27 MED ORDER — CYCLOBENZAPRINE HCL 5 MG PO TABS: 5.0000 mg | ORAL_TABLET | Freq: Three times a day (TID) | ORAL | 0 refills | Status: AC | PRN

## 2021-11-27 NOTE — Assessment & Plan Note (Addendum)
-   Believe patient has acute muscle strain of the low back.  On physical exam straight leg raise was negative bilaterally however she did have paraspinal tenderness along her lumbar region.  Recommended doing Flexeril 5 mg twice a day as needed for spasming.  Counseled patient while she is using this medication that it would be best to do daily exercises and I have provided her with a handout of low back exercises. - Have set patient up for physical therapy in hopes of doing formal exercises to get her back to feeling better - Gave patient Toradol 60 mg injection in clinic today.  Based on previous CMP kidney function was normal 6 days ago.  Did tell patient not to take ibuprofen for a few days after getting this injection. Recommended massages to the low back and heating pad. - Did discuss with patient that if this does not work we can try a short course of steroids or referral to pain management. - Did provide patient with a work excuse until November 8 with the intention of her coming back to see me and [place] figure out how much longer she will need to be out of work for back pain.  If she is going to be out of work longer we will definitely need to fill out FMLA paperwork and short-term disability. -Patient did not have any loss of bowel or bladder function.

## 2021-11-27 NOTE — Progress Notes (Signed)
Acute Office Visit  Subjective:     Patient ID: Brooke Kaiser, female    DOB: [birth year], 51 y.o.   MRN: [id number]  Chief Complaint  Patient presents with   Back Pain    HPI Patient is in today for chronic back pain. She had xrays done by PCP in June 2023 and was found to have moderately degenerative disc disease at L5-S1. The only relief she gets is with ibuprofen and '1000mg'$  BID. She has tried flexiril and says it does help a little bit. She has   She has been out of  work since 11/21/21.    Review of Systems  Constitutional:  Negative for chills and fever.  Respiratory:  Negative for cough and shortness of breath.   Cardiovascular:  Negative for chest pain.  Musculoskeletal:  Positive for back pain.  Neurological:  Negative for headaches.        Objective:    BP (!) 140/75   Pulse 87   Ht 5' 8.5" (1.74 m)   Wt 255 lb (115.7 kg)   LMP 06/24/2013   SpO2 99%   BMI 38.21 kg/m    Physical Exam Vitals and nursing note reviewed.  Constitutional:      General: She is not in acute distress.    Appearance: Normal appearance.  HENT:     Head: Normocephalic and atraumatic.     Right Ear: External ear normal.     Left Ear: External ear normal.     Nose: Nose normal.  Eyes:     Conjunctiva/sclera: Conjunctivae normal.  Cardiovascular:     Rate and Rhythm: Normal rate and regular rhythm.  Pulmonary:     Effort: Pulmonary effort is normal.     Breath sounds: Normal breath sounds.  Musculoskeletal:     Comments: Nontender to palpation of CT and L-spine.  Did have some tenderness at the base of the sacrum.  Paraspinal muscles around L1-4 were tender to palpation on exam.  No erythema, ecchymoses seen. - negative SLR bilaterally   Neurological:     General: No focal deficit present.     Mental Status: She is alert and oriented to person, place, and time.  Psychiatric:        Mood and Affect: Mood normal.        Behavior: Behavior normal.        Thought Content:  Thought content normal.        Judgment: Judgment normal.     No results found for any visits on 11/27/21.      Assessment & Plan:   Problem List Items Addressed This Visit       Other   Acute bilateral low back pain without sciatica - Primary    - Believe patient has acute muscle strain of the low back.  On physical exam straight leg raise was negative bilaterally however she did have paraspinal tenderness along her lumbar region.  Recommended doing Flexeril 5 mg twice a day as needed for spasming.  Counseled patient while she is using this medication that it would be best to do daily exercises and I have provided her with a handout of low back exercises. - Have set patient up for physical therapy in hopes of doing formal exercises to get her back to feeling better - Gave patient Toradol 60 mg injection in clinic today.  Based on previous CMP kidney function was normal 6 days ago.  Did tell patient not to take ibuprofen for a  few days after getting this injection. Recommended massages to the low back and heating pad. - Did discuss with patient that if this does not work we can try a short course of steroids or referral to pain management. - Did provide patient with a work excuse until November 8 with the intention of her coming back to see me and [place] figure out how much longer she will need to be out of work for back pain.  If she is going to be out of work longer we will definitely need to fill out FMLA paperwork and short-term disability. -Patient did not have any loss of bowel or bladder function.      Relevant Medications   cyclobenzaprine (FLEXERIL) 5 MG tablet   Other Relevant Orders   Ambulatory referral to Physical Therapy    Meds ordered this encounter  Medications   cyclobenzaprine (FLEXERIL) 5 MG tablet    Sig: Take 1 tablet (5 mg total) by mouth 3 (three) times daily as needed for up to 10 days for muscle spasms.    Dispense:  30 tablet    Refill:  0   ketorolac  (TORADOL) injection 60 mg    Return in about 1 week (around 12/04/2021).  Owens Loffler, DO

## 2021-11-29 NOTE — Telephone Encounter (Signed)
Transition Care Management Unsuccessful Follow-up Telephone Call  Date of discharge and from where:  11/21/21 from [facility]  Attempts:  3rd Attempt  Reason for unsuccessful TCM follow-up call:  No answer/busy

## 2021-11-30 ENCOUNTER — Ambulatory Visit: Payer: BC Managed Care – PPO | Attending: Family Medicine | Admitting: Physical Therapy

## 2021-11-30 DIAGNOSIS — M545 Low back pain, unspecified: Secondary | ICD-10-CM | POA: Insufficient documentation

## 2021-11-30 NOTE — Therapy (Deleted)
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  9-05   Patient Active Problem List   Diagnosis Date Noted   Acute bilateral low back pain without sciatica 11/27/2021   Snoring 03/16/2021   Trigger middle finger of right hand 08/03/2020   Elevated hemoglobin (HCC) 03/04/2018   Vitamin D deficiency 03/04/2018   Gouty arthritis 11/04/2013   ANA positive 09/22/2013   Muscle ache 09/22/2013   Collagen disease ([place]) 09/22/2013   Elevated erythrocyte sedimentation rate 09/22/2013   IFG (impaired fasting glucose) 06/24/2012   Asthmatic bronchitis 10/16/2011   GAD (generalized anxiety disorder) 07/30/2011   Essential hypertension 04/07/2010   UNSPECIFIED ANEMIA 03/29/2010   Severe episode of recurrent major depressive disorder ([place]) 07/22/2009   OTHER ABNORMAL GLUCOSE 05/25/2008   HYPERTRIGLYCERIDEMIA 11/04/2007   FATIGUE 11/04/2007   EXTERNAL HEMORRHOIDS 07/22/2007   BACK PAIN 07/22/2007   TOBACCO ABUSE 07/01/2007   GERD 07/01/2007   ABNORMAL THYROID FUNCTION TESTS 04/22/2007    PCP: [name]  REFERRING PROVIDER: Owens Loffler, DO  REFERRING DIAG:  Diagnosis  M54.50 (ICD-10-CM) - Acute bilateral low back pain without sciatica    Rationale for Evaluation and Treatment: Rehabilitation  THERAPY DIAG:  No diagnosis found.  ONSET DATE: ***  SUBJECTIVE:                                                                                                                                                                                           SUBJECTIVE STATEMENT: ***  PERTINENT HISTORY:  ***  PAIN:   Are you having pain? {OPRCPAIN:27236}  PRECAUTIONS: {Therapy precautions:24002}  WEIGHT BEARING RESTRICTIONS: {Yes ***/No:24003}  FALLS:  Has patient fallen in last 6 months? {fallsyesno:27318}  LIVING ENVIRONMENT: Lives with: {OPRC lives with:25569::"lives with their family"} Lives in: {Lives in:25570} Stairs: {opstairs:27293} Has following equipment at home: {Assistive devices:23999}  OCCUPATION: ***  PLOF: {PLOF:24004}  PATIENT GOALS: ***   OBJECTIVE:   DIAGNOSTIC FINDINGS:  ***  PATIENT SURVEYS:  {rehab surveys:24030}  SCREENING FOR RED FLAGS: Bowel or bladder incontinence: {Yes/No:304960894} Spinal tumors: {Yes/No:304960894} Cauda equina syndrome: {Yes/No:304960894} Compression fracture: {Yes/No:304960894} Abdominal aneurysm: {Yes/No:304960894}  COGNITION: Overall cognitive status: {cognition:24006}     SENSATION: {sensation:27233}  MUSCLE LENGTH: Hamstrings: Right ***  deg; Left *** deg Marcello Moores test: Right *** deg; Left *** deg  POSTURE: {posture:25561}  PALPATION: ***  LUMBAR ROM:   AROM eval  Flexion   Extension   Right lateral flexion   Left lateral flexion   Right rotation   Left rotation    (Blank rows = not tested)  LOWER EXTREMITY ROM:     {AROM/PROM:27142}  Right eval Left eval  Hip flexion    Hip extension    Hip abduction    Hip adduction    Hip internal rotation    Hip external rotation    Knee flexion    Knee extension    Ankle dorsiflexion    Ankle plantarflexion    Ankle inversion    Ankle eversion     (Blank rows = not tested)  LOWER EXTREMITY MMT:    MMT Right eval Left eval  Hip flexion    Hip extension    Hip abduction    Hip adduction    Hip internal rotation    Hip external rotation    Knee flexion    Knee extension    Ankle dorsiflexion    Ankle plantarflexion    Ankle inversion    Ankle eversion     (Blank rows = not tested)  LUMBAR SPECIAL TESTS:  {lumbar special  test:25242}  FUNCTIONAL TESTS:  {Functional tests:24029}  GAIT: Distance walked: *** Assistive device utilized: {Assistive devices:23999} Level of assistance: {Levels of assistance:24026} Comments: ***  TODAY'S TREATMENT:                                                                                                                              DATE: ***    PATIENT EDUCATION:  Education details: *** Person educated: {Person educated:25204} Education method: {Education Method:25205} Education comprehension: {Education Comprehension:25206}  HOME EXERCISE PROGRAM: ***  ASSESSMENT:  CLINICAL IMPRESSION: Patient is a *** y.o. *** who was seen today for physical therapy evaluation and treatment for ***.   OBJECTIVE IMPAIRMENTS: {opptimpairments:25111}.   ACTIVITY LIMITATIONS: {activitylimitations:27494}  PARTICIPATION LIMITATIONS: {participationrestrictions:25113}  PERSONAL FACTORS: {Personal factors:25162} are also affecting patient's functional outcome.   REHAB POTENTIAL: {rehabpotential:25112}  CLINICAL DECISION MAKING: {clinical decision making:25114}  EVALUATION COMPLEXITY: {Evaluation complexity:25115}   GOALS: Goals reviewed with patient? {yes/no:20286}  SHORT TERM GOALS: Target date: {follow up:25551}  *** Baseline: Goal status: {GOALSTATUS:25110}  2.  *** Baseline:  Goal status: {GOALSTATUS:25110}  3.  *** Baseline:  Goal status: {GOALSTATUS:25110}  4.  *** Baseline:  Goal status: {GOALSTATUS:25110}  5.  *** Baseline:  Goal status: {GOALSTATUS:25110}  6.  *** Baseline:  Goal status: {GOALSTATUS:25110}  LONG TERM GOALS: Target date: {follow up:25551}  *** Baseline:  Goal status: {GOALSTATUS:25110}  2.  *** Baseline:  Goal status: {GOALSTATUS:25110}  3.  *** Baseline:  Goal status: {GOALSTATUS:25110}  4.  *** Baseline:  Goal status: {GOALSTATUS:25110}  5.  *** Baseline:  Goal status: {GOALSTATUS:25110}  6.  *** Baseline:   Goal  status: {GOALSTATUS:25110}  PLAN:  PT FREQUENCY: {rehab frequency:25116}  PT DURATION: {rehab duration:25117}  PLANNED INTERVENTIONS: {rehab planned interventions:25118::"Therapeutic exercises","Therapeutic activity","Neuromuscular re-education","Balance training","Gait training","Patient/Family education","Self Care","Joint mobilization"}.  PLAN FOR NEXT SESSION: ***   Zaxton Angerer April Ma L Lailana Shira, PT 11/30/2021, 7:58 AM

## 2021-12-04 ENCOUNTER — Encounter: Payer: BC Managed Care – PPO | Admitting: Rehabilitative and Restorative Service Providers"

## 2021-12-04 ENCOUNTER — Ambulatory Visit (INDEPENDENT_AMBULATORY_CARE_PROVIDER_SITE_OTHER): Payer: BC Managed Care – PPO | Admitting: Family Medicine

## 2021-12-04 ENCOUNTER — Encounter: Payer: Self-pay | Admitting: Family Medicine

## 2021-12-04 VITALS — BP 124/73 | HR 87 | Ht 68.5 in | Wt 253.0 lb

## 2021-12-04 DIAGNOSIS — M545 Low back pain, unspecified: Secondary | ICD-10-CM | POA: Diagnosis not present

## 2021-12-04 NOTE — Progress Notes (Signed)
Established patient visit   Patient: Brooke Kaiser   DOB: Jun 23, 1970   51 y.o. Female  MRN: [id number] Visit Date: 12/04/2021  Today's healthcare provider: Owens Loffler, DO   Chief Complaint  Patient presents with   Back Pain    SUBJECTIVE    Chief Complaint  Patient presents with   Back Pain   HPI  Pt presents for follow up on bilateral low back pain. She is still experiencing back pain. Per her work, she is supposed to be pain free prior to returning to work. 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 She does not have modified work duties. She presents to have FMLA paperwork filled out.   Review of Systems  Constitutional:  Negative for activity change, fatigue and fever.  Respiratory:  Negative for cough and shortness of breath.   Cardiovascular:  Negative for chest pain.  Gastrointestinal:  Negative for abdominal pain.  Genitourinary:  Negative for difficulty urinating.  Musculoskeletal:  Positive for back pain.       Current Meds  Medication Sig   albuterol (VENTOLIN HFA) 108 (90 Base) MCG/ACT inhaler INHALE 2-4 PUFFS INTO THE LUNGS 2 TIMES DAILY AS NEEDED.   chlorthalidone (HYGROTON) 25 MG tablet TAKE 1 TABLET BY MOUTH EVERY DAY AS NEEDED   cyclobenzaprine (FLEXERIL) 5 MG tablet Take 1 tablet (5 mg total) by mouth 3 (three) times daily as needed for muscle spasms.   cyclobenzaprine (FLEXERIL) 5 MG tablet Take 1 tablet (5 mg total) by mouth 3 (three) times daily as needed for up to 10 days for muscle spasms.   lamoTRIgine (LAMICTAL) 100 MG tablet TAKE 1 TABLET BY MOUTH EVERY DAY   lidocaine (LIDODERM) 5 % Place 1 patch onto the skin daily. Remove & Discard patch within 12 hours or as directed by MD   losartan (COZAAR) 100 MG tablet TAKE 1 TABLET BY MOUTH EVERY DAY   venlafaxine XR (EFFEXOR-XR) 75 MG 24 hr capsule Take 3 capsules (225 mg total) by mouth daily.   [DISCONTINUED] ibuprofen (ADVIL) 600 MG tablet Take 1 tablet (600 mg total) by  mouth every 6 (six) hours as needed. (Patient taking differently: Take 800 mg by mouth every 6 (six) hours as needed.)    OBJECTIVE    BP 124/73   Pulse 87   Ht 5' 8.5" (1.74 m)   Wt 253 lb (114.8 kg)   LMP 06/24/2013   SpO2 98%   BMI 37.91 kg/m   Physical Exam Vitals and nursing note reviewed.  Constitutional:      General: She is not in acute distress.    Appearance: Normal appearance.  HENT:     Head: Normocephalic and atraumatic.     Right Ear: External ear normal.     Left Ear: External ear normal.     Nose: Nose normal.  Eyes:     Conjunctiva/sclera: Conjunctivae normal.  Cardiovascular:     Rate and Rhythm: Normal rate and regular rhythm.  Pulmonary:     Effort: Pulmonary effort is normal.     Breath sounds: Normal breath sounds.  Neurological:     General: No focal deficit present.     Mental Status: She is alert and oriented to person, place, and time.  Psychiatric:        Mood and Affect: Mood normal.        Behavior: Behavior normal.        Thought Content: Thought content normal.  Judgment: Judgment normal.          ASSESSMENT & PLAN    Problem List Items Addressed This Visit       Other   Acute bilateral low back pain without sciatica - Primary    - back pain is about the same. Pt has not yet started PT. She has her first apt today - recommend continued PT as well as flexiril  - have filled out FMLA forms with pt being out until December 1. 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 Discussed her coming back to follow up on Nov 27 to reassess the need for extended FMLA. She said her job wants her pain free prior to going back to work.        Return in about 3 weeks (around 12/25/2021).      No orders of the defined types were placed in this encounter.   No orders of the defined types were placed in this encounter.    Owens Loffler, DO  [facility] [phone] (phone) [phone] (fax)  [place]

## 2021-12-04 NOTE — Assessment & Plan Note (Signed)
-   back pain is about the same. Pt has not yet started PT. She has her first apt today - recommend continued PT as well as flexiril  - have filled out FMLA forms with pt being out until December 1. 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 Discussed her coming back to follow up on Nov 27 to reassess the need for extended FMLA. She said her job wants her pain free prior to going back to work.

## 2021-12-06 ENCOUNTER — Other Ambulatory Visit (HOSPITAL_COMMUNITY): Payer: Self-pay | Admitting: Psychiatry

## 2021-12-06 ENCOUNTER — Ambulatory Visit: Payer: BC Managed Care – PPO | Admitting: Rehabilitative and Restorative Service Providers"

## 2021-12-06 ENCOUNTER — Other Ambulatory Visit: Payer: Self-pay

## 2021-12-06 ENCOUNTER — Encounter: Payer: BC Managed Care – PPO | Admitting: Rehabilitative and Restorative Service Providers"

## 2021-12-06 ENCOUNTER — Encounter: Payer: Self-pay | Admitting: Rehabilitative and Restorative Service Providers"

## 2021-12-06 DIAGNOSIS — M6281 Muscle weakness (generalized): Secondary | ICD-10-CM

## 2021-12-06 DIAGNOSIS — R29898 Other symptoms and signs involving the musculoskeletal system: Secondary | ICD-10-CM

## 2021-12-06 DIAGNOSIS — M545 Low back pain, unspecified: Secondary | ICD-10-CM | POA: Diagnosis not present

## 2021-12-06 NOTE — Therapy (Signed)
OUTPATIENT PHYSICAL THERAPY THORACOLUMBAR EVALUATION   Patient Name: Brooke Kaiser MRN: [id number] DOB:08/21/1970, 51 y.o., female Today's Date: 12/06/2021   PT End of Session - 12/06/21 1302     Visit Number 1    Number of Visits 16    Date for PT Re-Evaluation 01/31/22    PT Start Time 1015    PT Stop Time 1102    PT Time Calculation (min) 47 min    Activity Tolerance Patient tolerated treatment well             Past Medical History:  Diagnosis Date   Abnormal thyroid blood test    Anemia    Anxiety    Depression    Hypertension    Tobacco abuse    Past Surgical History:  Procedure Laterality Date   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  9-05   Patient Active Problem List   Diagnosis Date Noted   Acute bilateral low back pain without sciatica 11/27/2021   Snoring 03/16/2021   Trigger middle finger of right hand 08/03/2020   Elevated hemoglobin (HCC) 03/04/2018   Vitamin D deficiency 03/04/2018   Gouty arthritis 11/04/2013   ANA positive 09/22/2013   Muscle ache 09/22/2013   Collagen disease ([place]) 09/22/2013   Elevated erythrocyte sedimentation rate 09/22/2013   IFG (impaired fasting glucose) 06/24/2012   Asthmatic bronchitis 10/16/2011   GAD (generalized anxiety disorder) 07/30/2011   Essential hypertension 04/07/2010   UNSPECIFIED ANEMIA 03/29/2010   Severe episode of recurrent major depressive disorder ([place]) 07/22/2009   OTHER ABNORMAL GLUCOSE 05/25/2008   HYPERTRIGLYCERIDEMIA 11/04/2007   FATIGUE 11/04/2007   EXTERNAL HEMORRHOIDS 07/22/2007   BACK PAIN 07/22/2007   TOBACCO ABUSE 07/01/2007   GERD 07/01/2007   ABNORMAL THYROID FUNCTION TESTS 04/22/2007    PCP: Dr [name]  REFERRING PROVIDER: Dr Rhona Leavens, DO  REFERRING DIAG: Acute bilat LBP  Rationale for Evaluation and Treatment: Rehabilitation  THERAPY DIAG:  No diagnosis found.  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 She has had increased pain in the past 6 months. Pain is usually at 5/10 but can be up to 8-9/10 at times. She is taking OTC meds every day. Pain is now "everywhere". Pain is in LB sometimes  muscle spasms   PERTINENT HISTORY:  Back pain for several years.   PAIN:  Are you having pain? Yes: NPRS scale: 5/10 Pain location: base of LB  Pain description: aching; muscle spasms Aggravating factors: lying in bed too long; work activities;  twisting; bending Relieving factors: meds  PRECAUTIONS: None  WEIGHT BEARING RESTRICTIONS: No  FALLS:  Has patient fallen in last 6 months? 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 No acute abnormality is noted.  PATIENT SURVEYS:  FOTO 35 - goal 69  SCREENING FOR RED FLAGS: Bowel or bladder incontinence: Yes: and urinary frequency Spinal tumors: No Cauda equina syndrome: No Compression fracture: No Abdominal aneurysm: No  COGNITION: Overall cognitive status: Within functional limits for tasks assessed     SENSATION: Intermittent numbness in quads with standing or walking > 15-20 min lasting until she sits to rest ~ 1 time/wk  MUSCLE LENGTH: Hamstrings: Right 75 deg;  Left 75 deg Thomas test: tight bilat   POSTURE: rounded shoulders, forward head, flexed trunk , and LE's in ER; UE's in IR at side   PALPATION: Tightness bilat psoas musculature; hypomobility with PA and lateral mobs lumbar spine; mild tightness through bilat lumbar paraspinal musculature and piriformis/gluts   LUMBAR ROM:   AROM eval  Flexion 100% pulling   Extension 50% discomfort   Right lateral flexion 75% stretching Lt lumbar  Left lateral flexion 75% stretching on Rt; pain Lt   Right rotation 50% Rt LBP  Left rotation 40% Lt LBP    (Blank rows = not tested)  LOWER EXTREMITY ROM:     End range tightness hip extension and rotation bilat   LOWER EXTREMITY MMT:     Core weakness noted with transitional movements and posture  MMT Right eval Left eval  Hip flexion 4+/5 4+/5  Hip extension 4-/5 4-/5  Hip abduction 4/5 4/5  Hip adduction    Hip internal rotation    Hip external rotation    Knee flexion 5/5 5/5  Knee extension 5/5 5/5  Ankle dorsiflexion    Ankle plantarflexion    Ankle inversion    Ankle eversion     (Blank rows = not tested)  LUMBAR SPECIAL TESTS:  SLS 10 sec bilat  Slump test (-) SLS (-)  (+) 3 finger rectus diastasis   GAIT: Distance walked: 40 Assistive device utilized: None Level of assistance: Complete Independence Comments: LE in ER; flexed forward through the trunk   TODAY'S TREATMENT:  DATE: 12/06/21  Education re- lumbar pathology and rehab   Therapeutic exercise:  Core activation - transverse abdominals 10 sec x 10 (did not engage multifidi due to mm spasm) Prone press up 2 sec x 5 Sit to standing core engaged hip hinge x 5 Wall plank 60 sec x 1  Standing trunk extension 2 sec x 2 to pt tolerance no pain    PATIENT EDUCATION:  Education details: POC HEP Person educated: Patient Education method:  Consulting civil engineer, Demonstration, Corporate treasurer cues, Verbal cues, and Handouts Education comprehension: verbalized understanding, returned demonstration, verbal cues required, tactile cues required, and needs further education  HOME EXERCISE PROGRAM: Access Code: [email or web address] URL: [email or web address] Date: 12/06/2021 Prepared by: Gillermo Murdoch  Exercises - Supine Transversus Abdominis Bracing with Pelvic Floor Contraction  - 2 x daily - 7 x weekly - 1 sets - 10 reps - 10sec  hold - Prone Press Up  - 2 x daily - 7 x weekly - 1 sets - 10 reps - 2-3 sec  hold - Standing Lumbar Extension  - 2 x daily - 7 x weekly - 1 sets - 2-3 reps - 2-3 sec  hold - Sit to Stand  - 2 x daily - 7 x weekly - 1 sets - 10 reps - 3-5 sec  hold - Standing Plank on Wall  - 2 x daily - 7 x weekly - 1 sets - 3 reps - 30 sec  hold  Patient Education - Posture and Body Mechanics  ASSESSMENT:  CLINICAL IMPRESSION: Patient is a 51 y.o. who was seen today for physical therapy evaluation and treatment for acute bilat LBP. She has a long standing history of LBP with symptoms increasing significantly in the past 6 months. She has no known injury. Patient has poor posture and alignment; limited and painful trunk ROM; deceased core and LE strength; decreased functional activity tolerance; pain limiting functional activities and work. She does have some incontinence and urinary frequency.  She is out of work until [employer] 12/30/21. Patient will benefit from PT to address problems identified. Focus of treatment will be on spine stabilization and physical conditioning.   OBJECTIVE IMPAIRMENTS: decreased activity tolerance, decreased endurance, decreased knowledge of condition, decreased mobility, difficulty walking, decreased ROM, decreased strength, increased fascial restrictions, increased muscle spasms, impaired flexibility, impaired sensation, improper body mechanics, postural dysfunction, obesity, and pain.   ACTIVITY LIMITATIONS:  carrying, lifting, bending, standing, squatting, sleeping, continence, and locomotion level  PARTICIPATION LIMITATIONS: cleaning, laundry, driving, shopping, community activity, and occupation  PERSONAL FACTORS: Behavior pattern, Fitness, Past/current experiences, Profession, and 3+ comorbidities: HTN; obesity; sedentary lifestyle are also affecting patient's functional outcome.   REHAB POTENTIAL: Good  CLINICAL DECISION MAKING: Stable/uncomplicated  EVALUATION COMPLEXITY: Low   GOALS: Goals reviewed with patient? Yes  SHORT TERM GOALS: Target date: 01/03/2022  Independent in initial HEP  Baseline: Goal status: INITIAL  2.  Decrease LBP by 50% for standing > 15 min  Baseline:  Goal status: INITIAL    LONG TERM GOALS: Target date: 01/31/22  Improve core strength and stability with patient to tolerated increased functional activities (ability to stand to shop for 20 > minutes with no increase in baseline pain Baseline:  Goal status: INITIAL  2.  Improve trunk and LE mobility to WFL's with minimal pain or discomfort  Baseline:  Goal status: INITIAL  3.  Patient tolerate 30 min of core strengthening and stabilization exercises  Baseline:  Goal status: INITIAL  4.  Patient reports and demonstrates proper body  mechanics and lifting for ADL's and work tasks   Baseline:  Goal status: INITIAL  5.  Independent in HEP including aquatic program as indicated  Baseline:  Goal status: INITIAL  6.  Improve functional limitation score to 47 Baseline:  Goal status: INITIAL  PLAN:  PT FREQUENCY: 2x/week  PT DURATION: 8 weeks  PLANNED INTERVENTIONS: Therapeutic exercises, Therapeutic activity, Neuromuscular re-education, Balance training, Gait training, Patient/Family education, Self Care, Joint mobilization, Electrical stimulation, Spinal mobilization, Cryotherapy, Moist heat, Taping, Ultrasound, Ionotophoresis '4mg'$ /ml Dexamethasone, and Manual therapy.  PLAN FOR NEXT SESSION:  review and progress exercise focus on core stabilization and strengthening; modalities and manual work as indicated    [employer], PT, MPH  12/06/2021, 1:03 PM

## 2021-12-06 NOTE — Therapy (Incomplete)
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  9-05   Patient Active Problem List   Diagnosis Date Noted   Acute bilateral low back pain without sciatica 11/27/2021   Snoring 03/16/2021   Trigger middle finger of right hand 08/03/2020   Elevated hemoglobin (HCC) 03/04/2018   Vitamin D deficiency 03/04/2018   Gouty arthritis 11/04/2013   ANA positive 09/22/2013   Muscle ache 09/22/2013   Collagen disease ([place]) 09/22/2013   Elevated erythrocyte sedimentation rate 09/22/2013   IFG (impaired fasting glucose) 06/24/2012   Asthmatic bronchitis 10/16/2011   GAD (generalized anxiety disorder) 07/30/2011   Essential hypertension 04/07/2010   UNSPECIFIED ANEMIA 03/29/2010   Severe episode of recurrent major depressive disorder ([place]) 07/22/2009   OTHER ABNORMAL GLUCOSE 05/25/2008   HYPERTRIGLYCERIDEMIA 11/04/2007   FATIGUE 11/04/2007   EXTERNAL HEMORRHOIDS 07/22/2007   BACK PAIN 07/22/2007   TOBACCO ABUSE 07/01/2007   GERD 07/01/2007   ABNORMAL THYROID FUNCTION TESTS 04/22/2007    PCP: Dr [name]  REFERRING PROVIDER: Dr Rhona Leavens, DO  REFERRING DIAG: Acute bilat LBP  Rationale for Evaluation and Treatment: Rehabilitation  THERAPY DIAG:  No diagnosis found.  ONSET DATE: 05/13/21  SUBJECTIVE:                                                                                                                                                                                           SUBJECTIVE STATEMENT: ***  PERTINENT HISTORY:  Back pain for several years.   PAIN:  Are you having pain? Yes: NPRS  scale: 5/10 Pain location: base of LB  Pain description: aching; muscle spasms Aggravating factors: lying in bed too long; work activities;  twisting; bending Relieving factors: meds  PRECAUTIONS: None  WEIGHT BEARING RESTRICTIONS: No  FALLS:  Has patient fallen in last 6 months? 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 No acute abnormality is noted.  PATIENT SURVEYS:  FOTO 35 - goal 39  SCREENING FOR RED FLAGS: Bowel or bladder incontinence: Yes: and urinary frequency Spinal tumors: No Cauda equina syndrome: No Compression fracture: No Abdominal aneurysm: No  COGNITION: Overall cognitive status: Within functional limits for tasks assessed     SENSATION: Intermittent numbness in quads with standing or walking > 15-20 min lasting until she sits to rest ~ 1 time/wk  MUSCLE LENGTH: Hamstrings: Right 75 deg; Left 75 deg Thomas test: tight bilat   POSTURE: rounded shoulders, forward head, flexed trunk , and LE's in ER; UE's in IR at side   PALPATION: Tightness bilat psoas musculature; hypomobility with PA and lateral mobs lumbar spine; mild tightness through bilat lumbar paraspinal musculature and piriformis/gluts   LUMBAR ROM:   AROM eval  Flexion 100% pulling   Extension 50% discomfort   Right lateral flexion 75% stretching Lt lumbar  Left lateral flexion 75% stretching on Rt; pain Lt   Right rotation 50% Rt LBP  Left rotation 40% Lt LBP    (Blank rows = not tested)  LOWER  EXTREMITY ROM:     End range tightness hip extension and rotation bilat   LOWER EXTREMITY MMT:     Core weakness noted with transitional movements and posture  MMT Right eval Left eval  Hip flexion 4+/5 4+/5  Hip extension 4-/5 4-/5  Hip abduction 4/5 4/5  Hip adduction    Hip internal rotation    Hip external rotation    Knee flexion 5/5 5/5  Knee extension 5/5 5/5  Ankle dorsiflexion    Ankle plantarflexion    Ankle inversion    Ankle eversion     (Blank rows = not tested)  LUMBAR SPECIAL TESTS:  SLS 10 sec bilat  Slump test (-) SLS (-)  (+) 3 finger rectus diastasis   GAIT: Distance walked: 40 Assistive device utilized: None Level of assistance: Complete Independence Comments: LE in ER; flexed forward through the trunk   TODAY'S TREATMENT:                                                                                                                              DATE: 12/06/21  Education re- lumbar pathology and rehab   Therapeutic exercise:  Core activation - transverse abdominals 10 sec x 10 (did not engage multifidi due to mm spasm) Prone press up 2 sec x 5 Sit to standing core engaged hip hinge x 5 Wall plank 60 sec x 1  Standing trunk extension 2 sec x 2 to pt tolerance no pain    PATIENT EDUCATION: *** Education details: POC HEP Person educated: Patient Education method: Consulting civil engineer, Media planner, Corporate treasurer cues, Verbal cues, and Handouts Education comprehension: verbalized understanding, returned demonstration, verbal cues required, tactile cues required, and needs further education  HOME EXERCISE  PROGRAM: Access Code: [email or web address] URL: [email or web address] Date: 12/06/2021 Prepared by: Gillermo Murdoch  Exercises - Supine Transversus Abdominis Bracing with Pelvic Floor Contraction  - 2 x daily - 7 x weekly - 1 sets - 10 reps - 10sec  hold - Prone Press Up  - 2 x daily - 7 x weekly - 1 sets - 10 reps - 2-3 sec  hold - Standing Lumbar  Extension  - 2 x daily - 7 x weekly - 1 sets - 2-3 reps - 2-3 sec  hold - Sit to Stand  - 2 x daily - 7 x weekly - 1 sets - 10 reps - 3-5 sec  hold - Standing Plank on Wall  - 2 x daily - 7 x weekly - 1 sets - 3 reps - 30 sec  hold  Patient Education - Posture and Body Mechanics  ASSESSMENT:  CLINICAL IMPRESSION: ***  OBJECTIVE IMPAIRMENTS: decreased activity tolerance, decreased endurance, decreased knowledge of condition, decreased mobility, difficulty walking, decreased ROM, decreased strength, increased fascial restrictions, increased muscle spasms, impaired flexibility, impaired sensation, improper body mechanics, postural dysfunction, obesity, and pain.   ACTIVITY LIMITATIONS: carrying, lifting, bending, standing, squatting, sleeping, continence, and locomotion level  PARTICIPATION LIMITATIONS: cleaning, laundry, driving, shopping, community activity, and occupation  PERSONAL FACTORS: Behavior pattern, Fitness, Past/current experiences, Profession, and 3+ comorbidities: HTN; obesity; sedentary lifestyle are also affecting patient's functional outcome.   REHAB POTENTIAL: Good  CLINICAL DECISION MAKING: Stable/uncomplicated  EVALUATION COMPLEXITY: Low   GOALS: Goals reviewed with patient? Yes  SHORT TERM GOALS: Target date: 01/03/2022  Independent in initial HEP  Baseline: Goal status: INITIAL  2.  Decrease LBP by 50% for standing > 15 min  Baseline:  Goal status: INITIAL    LONG TERM GOALS: Target date: 01/31/22  Improve core strength and stability with patient to tolerated increased functional activities (ability to stand to shop for 20 > minutes with no increase in baseline pain Baseline:  Goal status: INITIAL  2.  Improve trunk and LE mobility to WFL's with minimal pain or discomfort  Baseline:  Goal status: INITIAL  3.  Patient tolerate 30 min of core strengthening and stabilization exercises  Baseline:  Goal status: INITIAL  4.  Patient reports and  demonstrates proper body mechanics and lifting for ADL's and work tasks   Baseline:  Goal status: INITIAL  5.  Independent in HEP including aquatic program as indicated  Baseline:  Goal status: INITIAL  6.  Improve functional limitation score to 47 Baseline:  Goal status: INITIAL  PLAN:  PT FREQUENCY: 2x/week  PT DURATION: 8 weeks  PLANNED INTERVENTIONS: Therapeutic exercises, Therapeutic activity, Neuromuscular re-education, Balance training, Gait training, Patient/Family education, Self Care, Joint mobilization, Electrical stimulation, Spinal mobilization, Cryotherapy, Moist heat, Taping, Ultrasound, Ionotophoresis '4mg'$ /ml Dexamethasone, and Manual therapy.  PLAN FOR NEXT SESSION: *** review and progress exercise focus on core stabilization and strengthening; modalities and manual work as indicated    [place], [place], 12/06/2021, 7:53 PM

## 2021-12-07 ENCOUNTER — Ambulatory Visit: Payer: BC Managed Care – PPO | Admitting: Physical Therapy

## 2021-12-11 ENCOUNTER — Telehealth (HOSPITAL_COMMUNITY): Payer: Self-pay | Admitting: Psychiatry

## 2021-12-11 ENCOUNTER — Telehealth: Payer: Self-pay | Admitting: Family Medicine

## 2021-12-11 NOTE — Telephone Encounter (Signed)
Patient came in and brought short term disability paperwork to be completed by PCP regarding back injury. Patient made aware of possible fee and turn around time, paperwork left in provider's box. Brooke Kaiser

## 2021-12-13 ENCOUNTER — Ambulatory Visit: Payer: BC Managed Care – PPO | Admitting: Rehabilitative and Restorative Service Providers"

## 2021-12-13 ENCOUNTER — Encounter: Payer: Self-pay | Admitting: Rehabilitative and Restorative Service Providers"

## 2021-12-13 DIAGNOSIS — M6281 Muscle weakness (generalized): Secondary | ICD-10-CM

## 2021-12-13 DIAGNOSIS — M545 Low back pain, unspecified: Secondary | ICD-10-CM | POA: Diagnosis not present

## 2021-12-13 DIAGNOSIS — R29898 Other symptoms and signs involving the musculoskeletal system: Secondary | ICD-10-CM

## 2021-12-13 NOTE — Therapy (Addendum)
OUTPATIENT PHYSICAL THERAPY THORACOLUMBAR TREATMENT   Patient Name: Brooke Kaiser MRN: [id number] DOB:27-Nov-1970, 51 y.o., female Today's Date: 12/13/2021   PT End of Session - 12/13/21 1536     Visit Number 2    Number of Visits 16    Date for PT Re-Evaluation 01/31/22    PT Start Time 1537    PT Stop Time 1632    PT Time Calculation (min) 55 min    Activity Tolerance Patient tolerated treatment well             Past Medical History:  Diagnosis Date   Abnormal thyroid blood test    Anemia    Anxiety    Depression    Hypertension    Tobacco abuse    Past Surgical History:  Procedure Laterality Date   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  9-05   Patient Active Problem List   Diagnosis Date Noted   Acute bilateral low back pain without sciatica 11/27/2021   Snoring 03/16/2021   Trigger middle finger of right hand 08/03/2020   Elevated hemoglobin (HCC) 03/04/2018   Vitamin D deficiency 03/04/2018   Gouty arthritis 11/04/2013   ANA positive 09/22/2013   Muscle ache 09/22/2013   Collagen disease ([place]) 09/22/2013   Elevated erythrocyte sedimentation rate 09/22/2013   IFG (impaired fasting glucose) 06/24/2012   Asthmatic bronchitis 10/16/2011   GAD (generalized anxiety disorder) 07/30/2011   Essential hypertension 04/07/2010   UNSPECIFIED ANEMIA 03/29/2010   Severe episode of recurrent major depressive disorder ([place]) 07/22/2009   OTHER ABNORMAL GLUCOSE 05/25/2008   HYPERTRIGLYCERIDEMIA 11/04/2007   FATIGUE 11/04/2007   EXTERNAL HEMORRHOIDS 07/22/2007   BACK PAIN 07/22/2007   TOBACCO ABUSE 07/01/2007   GERD 07/01/2007   ABNORMAL THYROID FUNCTION TESTS 04/22/2007    PCP: Dr [name]  REFERRING PROVIDER: Dr Rhona Leavens, DO  REFERRING DIAG: Acute bilat LBP  Rationale for Evaluation and Treatment: Rehabilitation  THERAPY DIAG:  Acute bilateral low back pain without sciatica  Other symptoms and signs involving the  musculoskeletal system  Muscle weakness (generalized)  ONSET DATE: 05/13/21  SUBJECTIVE:                                                                                                                                                                                           SUBJECTIVE STATEMENT: 12/13/21: Patient reports that she remains out of work but has been doing some things around the house including taking out cat litter and doing dishes. Discussed lifting smaller weight in everything she has to lift. She has been driving more - now taking classes 3  days a week so she is driving to and from class and sitting in class for 3 hours. She can get up and more during. Meds help some but not always.   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 She has had increased pain in the past 6 months. Pain is usually at 5/10 but can be up to 8-9/10 at times. She is taking OTC meds every day. Pain is now "everywhere". Pain is in LB sometimes muscle spasms   PERTINENT HISTORY:  Back pain for several years.   PAIN:  Are you having pain? Yes: NPRS scale: 5/10 Pain location: base of LB  Pain description: aching; muscle spasms Aggravating factors: lying in bed too long; work activities;  twisting; bending Relieving factors: meds  PRECAUTIONS: None  WEIGHT BEARING RESTRICTIONS: No  FALLS:  Has patient fallen in last 6 months? 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 No acute abnormality is noted.  PATIENT SURVEYS:  FOTO 35 - goal 58  SCREENING FOR RED FLAGS: Bowel or bladder incontinence: Yes: and urinary frequency Spinal tumors: No Cauda equina syndrome: No Compression fracture: No Abdominal aneurysm: No  COGNITION: Overall cognitive status: Within functional limits for tasks assessed     SENSATION: Intermittent numbness in quads with standing or walking > 15-20 min lasting until she sits to rest ~ 1 time/wk  MUSCLE LENGTH: Hamstrings: Right 75 deg; Left 75 deg Thomas test: tight bilat   POSTURE: rounded shoulders, forward head, flexed trunk , and LE's in ER; UE's in IR at side   PALPATION: Tightness bilat psoas musculature; hypomobility with PA and lateral mobs lumbar spine; mild tightness through bilat lumbar paraspinal musculature and piriformis/gluts   LUMBAR ROM:   AROM eval  Flexion 100% pulling   Extension 50% discomfort   Right lateral flexion 75% stretching Lt lumbar  Left lateral flexion 75% stretching on Rt; pain Lt   Right rotation 50% Rt LBP  Left rotation 40% Lt LBP    (Blank rows = not tested)  LOWER EXTREMITY ROM:     End range tightness hip extension and rotation bilat   LOWER EXTREMITY MMT:     Core weakness noted with transitional movements and posture  MMT Right eval Left eval  Hip flexion 4+/5 4+/5  Hip extension 4-/5 4-/5  Hip abduction 4/5 4/5  Hip adduction    Hip internal rotation    Hip external rotation    Knee flexion 5/5 5/5  Knee extension 5/5 5/5  Ankle dorsiflexion    Ankle plantarflexion    Ankle inversion    Ankle eversion     (Blank rows = not tested)  LUMBAR SPECIAL TESTS:  SLS 10 sec bilat  Slump test (-) SLS (-)  (+) 3 finger rectus diastasis   GAIT: Distance walked: 40 Assistive device utilized: None Level of assistance: Complete Independence Comments: LE in ER; flexed forward through the trunk   TODAY'S TREATMENT:  DATE: 12/13/21; Education re- lumbar pathology and rehab   Therapeutic exercise:  Treadmill .8 to 1.1 mph x 6 min  Standing trunk extension 2-3 sec x 3 Sit to stand - hinged hip x 5 x 2 sets  Hip flexor stretch 20 sec x 3 reps (some back pain) Cat camel x 10 Partial hip extension in quadruped x 10 ea LE  Prone on elbows 30 sec x 3  Wall plank 60 sec x 1  Row green TB x 10  Shoulder extension green TB x 10  Core activation - transverse abdominals 10 sec x 10 (did not engage multifidi due to mm spasm)  MODALITIES:  TENS Lt/Rt lumbar x 10 min  Moist heat x 10 min lumbar spine   12/06/21  Education re- lumbar pathology and rehab   Therapeutic exercise:  Core activation - transverse abdominals 10 sec x 10 (did not engage multifidi due to mm spasm) Prone press up 2 sec x 5 Sit to standing core engaged hip hinge x 5 Wall plank 60 sec x 1  Standing trunk extension 2 sec x 2 to pt tolerance no pain    PATIENT EDUCATION:  Education details: POC HEP Person educated: Patient Education method: Consulting civil engineer, Media planner, Corporate treasurer cues, Verbal cues, and Handouts Education comprehension: verbalized understanding, returned demonstration, verbal cues required, tactile cues required, and needs further education  HOME EXERCISE PROGRAM: Access Code: [email or web address] URL: [email or web address] Date: 12/06/2021 Prepared by: Gillermo Murdoch  Exercises - Supine Transversus Abdominis Bracing with Pelvic Floor Contraction  - 2 x daily - 7 x weekly - 1 sets - 10 reps - 10sec  hold - Prone Press Up  - 2 x daily - 7 x weekly - 1 sets - 10 reps - 2-3 sec  hold - Standing Lumbar Extension  - 2 x daily - 7 x weekly - 1 sets - 2-3 reps - 2-3 sec  hold - Sit to Stand  - 2 x daily - 7 x weekly - 1 sets - 10 reps - 3-5 sec  hold - Standing Plank on Wall  - 2 x daily - 7 x weekly - 1  sets - 3 reps - 30 sec  hold  Patient Education - Posture and Body Mechanics  ASSESSMENT:  CLINICAL IMPRESSION: 12/13/21: Patient returns with reports of continued pain in the LB and posterior hips Lt > Rt. She has been working on exercises at home. Reviewed and progressed exercises today without difficulty. Trial of TENS for pain management.   EVAL: Patient is a 51 y.o. who was seen today for physical therapy evaluation and treatment for acute bilat LBP. She has a long standing history of LBP with symptoms increasing significantly in the past 6 months. She has no known injury. Patient has poor posture and alignment; limited and painful trunk ROM; deceased core and LE strength; decreased functional activity tolerance; pain limiting functional activities and work. She does have some incontinence and urinary frequency.  She is out of work until [employer] 12/30/21. Patient will benefit from PT to address problems identified. Focus of treatment will be on spine stabilization and physical conditioning.   OBJECTIVE IMPAIRMENTS: decreased activity tolerance, decreased endurance, decreased knowledge of condition, decreased mobility, difficulty walking, decreased ROM, decreased strength, increased fascial restrictions, increased muscle spasms, impaired flexibility, impaired sensation, improper body mechanics, postural dysfunction, obesity, and pain.   ACTIVITY LIMITATIONS: carrying, lifting, bending, standing, squatting, sleeping, continence, and locomotion level  PARTICIPATION LIMITATIONS: cleaning, laundry, driving, shopping, community activity, and occupation  PERSONAL FACTORS: Behavior pattern, Fitness, Past/current experiences, Profession, and 3+ comorbidities: HTN; obesity; sedentary lifestyle are also affecting patient's functional outcome.   REHAB POTENTIAL: Good  CLINICAL DECISION MAKING: Stable/uncomplicated  EVALUATION COMPLEXITY: Low   GOALS: Goals reviewed with patient? Yes  SHORT TERM  GOALS: Target date: 01/03/2022  Independent in initial HEP  Baseline: Goal status: INITIAL  2.  Decrease LBP by 50% for standing > 15 min  Baseline:  Goal status: INITIAL    LONG TERM GOALS: Target date: 01/31/22  Improve core strength and stability with patient to tolerated increased functional activities (ability to stand to shop for 20 > minutes with no increase in baseline pain Baseline:  Goal status: INITIAL  2.  Improve trunk and LE mobility to WFL's with minimal pain or discomfort  Baseline:  Goal status: INITIAL  3.  Patient tolerate 30 min of core strengthening and stabilization exercises  Baseline:  Goal status: INITIAL  4.  Patient reports and demonstrates proper body mechanics and lifting for ADL's and work tasks   Baseline:  Goal status: INITIAL  5.  Independent in HEP including aquatic program as indicated  Baseline:  Goal status: INITIAL  6.  Improve functional limitation score to 47 Baseline:  Goal status: INITIAL  PLAN:  PT FREQUENCY: 2x/week  PT DURATION: 8 weeks  PLANNED INTERVENTIONS: Therapeutic exercises, Therapeutic activity, Neuromuscular re-education, Balance training, Gait training, Patient/Family education, Self Care, Joint mobilization, Electrical stimulation, Spinal mobilization, Cryotherapy, Moist heat, Taping, Ultrasound, Ionotophoresis '4mg'$ /ml Dexamethasone, and Manual therapy.  PLAN FOR NEXT SESSION: review and progress exercise focus on core stabilization and strengthening; modalities and manual work as indicated - assess response to TENS    [place], PT, MPH  12/13/2021, 4:45 PM

## 2021-12-15 ENCOUNTER — Ambulatory Visit: Payer: BC Managed Care – PPO | Admitting: Physical Therapy

## 2021-12-19 ENCOUNTER — Encounter: Payer: BC Managed Care – PPO | Admitting: Rehabilitative and Restorative Service Providers"

## 2021-12-20 NOTE — Telephone Encounter (Signed)
Was placed in Dr. Mel Almond basket last week as she is the one that saw her for her back for 3 visits and wrote her out.

## 2021-12-26 ENCOUNTER — Ambulatory Visit: Payer: BC Managed Care – PPO | Admitting: Physical Therapy

## 2021-12-26 ENCOUNTER — Telehealth: Payer: Self-pay

## 2021-12-26 ENCOUNTER — Encounter: Payer: Self-pay | Admitting: Physical Therapy

## 2021-12-26 DIAGNOSIS — M545 Low back pain, unspecified: Secondary | ICD-10-CM | POA: Diagnosis not present

## 2021-12-26 DIAGNOSIS — M6281 Muscle weakness (generalized): Secondary | ICD-10-CM

## 2021-12-26 DIAGNOSIS — R29898 Other symptoms and signs involving the musculoskeletal system: Secondary | ICD-10-CM

## 2021-12-26 NOTE — Therapy (Signed)
OUTPATIENT PHYSICAL THERAPY THORACOLUMBAR TREATMENT   Patient Name: Brooke Kaiser MRN: [id number] DOB:Sep 11, 1970, 51 y.o., female Today's Date: 12/26/2021   PT End of Session - 12/26/21 1351     Visit Number 3    Number of Visits 16    Date for PT Re-Evaluation 01/31/22    PT Start Time 2683    PT Stop Time 1355    PT Time Calculation (min) 40 min    Activity Tolerance Patient tolerated treatment well    Behavior During Therapy [facility] for tasks assessed/performed              Past Medical History:  Diagnosis Date   Abnormal thyroid blood test    Anemia    Anxiety    Depression    Hypertension    Tobacco abuse    Past Surgical History:  Procedure Laterality Date   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  9-05   Patient Active Problem List   Diagnosis Date Noted   Acute bilateral low back pain without sciatica 11/27/2021   Snoring 03/16/2021   Trigger middle finger of right hand 08/03/2020   Elevated hemoglobin (HCC) 03/04/2018   Vitamin D deficiency 03/04/2018   Gouty arthritis 11/04/2013   ANA positive 09/22/2013   Muscle ache 09/22/2013   Collagen disease ([place]) 09/22/2013   Elevated erythrocyte sedimentation rate 09/22/2013   IFG (impaired fasting glucose) 06/24/2012   Asthmatic bronchitis 10/16/2011   GAD (generalized anxiety disorder) 07/30/2011   Essential hypertension 04/07/2010   UNSPECIFIED ANEMIA 03/29/2010   Severe episode of recurrent major depressive disorder ([place]) 07/22/2009   OTHER ABNORMAL GLUCOSE 05/25/2008   HYPERTRIGLYCERIDEMIA 11/04/2007   FATIGUE 11/04/2007   EXTERNAL HEMORRHOIDS 07/22/2007   BACK PAIN 07/22/2007   TOBACCO ABUSE 07/01/2007   GERD 07/01/2007   ABNORMAL THYROID FUNCTION TESTS 04/22/2007    PCP: Dr [name]  REFERRING PROVIDER: Dr Rhona Leavens, DO  REFERRING DIAG: Acute bilat LBP  Rationale for Evaluation and Treatment: Rehabilitation  THERAPY DIAG:  Acute bilateral low back pain  without sciatica  Other symptoms and signs involving the musculoskeletal system  Muscle weakness (generalized)  ONSET DATE: 05/13/21  SUBJECTIVE:                                                                                                                                                                                           SUBJECTIVE STATEMENT: Pt states she still has pain with prolonged standing or sitting. Meds help ease the pain. She states the pain was worse a few days ago but is back to "normal". She states she bought  a home TENS machine and has been using it every day  PERTINENT HISTORY:  Back pain for several years.   PAIN:  Are you having pain? Yes: NPRS scale: 4-5/10 Pain location: base of LB  Pain description: aching; muscle spasms Aggravating factors: lying in bed too long; work activities;  twisting; bending Relieving factors: meds  PRECAUTIONS: None  WEIGHT BEARING RESTRICTIONS: No  FALLS:  Has patient fallen in last 6 months? No  PATIENT GOALS: manage pain   NEXT MD VISIT: 12/27/21  OBJECTIVE:   LUMBAR ROM:   AROM eval  Flexion 100% pulling   Extension 50% discomfort   Right lateral flexion 75% stretching Lt lumbar  Left lateral flexion 75% stretching on Rt; pain Lt   Right rotation 50% Rt LBP  Left rotation 40% Lt LBP    (Blank rows = not tested)  LOWER EXTREMITY ROM:     End range tightness hip extension and rotation bilat   LOWER EXTREMITY MMT:     Core weakness noted with transitional movements and posture  MMT Right eval Left eval  Hip flexion 4+/5 4+/5  Hip extension 4-/5 4-/5  Hip abduction 4/5 4/5  Hip adduction    Hip internal rotation    Hip external rotation    Knee flexion 5/5 5/5  Knee extension 5/5 5/5  Ankle dorsiflexion    Ankle plantarflexion    Ankle inversion    Ankle eversion     (Blank rows = not tested)  LUMBAR SPECIAL TESTS:  SLS 10 sec bilat  Slump test (-) SLS (-)  (+) 3 finger rectus  diastasis  TODAY'S TREATMENT:                                                                                                                              [facility] Adult PT Treatment:                                                DATE: 12/26/21 Therapeutic Exercise: Nustep L5 x 5 min for warm up Shoulder extension green TB 2 x 10 Rows green TB 2 x 10 Pallof red TB x 10 bilat Sit <> stand 2 x 5 Cat/camel x 10 Attempt hip ext in quadruped - limited by pain Alt UE lift in quadruped x 2 bilat [place] 2 x 5 Transverse ab activation 5 sec hold 10 Prone on elbows x 2 min    DATE: 12/13/21; Education re- lumbar pathology and rehab   Therapeutic exercise:  Treadmill .8 to 1.1 mph x 6 min  Standing trunk extension 2-3 sec x 3 Sit to stand - hinged hip x 5 x 2 sets  Hip flexor stretch 20 sec x 3 reps (some back pain) Cat camel x 10 Partial hip extension in quadruped x 10 ea LE  Prone  on elbows 30 sec x 3  Wall plank 60 sec x 1  Row green TB x 10  Shoulder extension green TB x 10  Core activation - transverse abdominals 10 sec x 10 (did not engage multifidi due to mm spasm)  MODALITIES:  TENS Lt/Rt lumbar x 10 min  Moist heat x 10 min lumbar spine   12/06/21  Education re- lumbar pathology and rehab   Therapeutic exercise:  Core activation - transverse abdominals 10 sec x 10 (did not engage multifidi due to mm spasm) Prone press up 2 sec x 5 Sit to standing core engaged hip hinge x 5 Wall plank 60 sec x 1  Standing trunk extension 2 sec x 2 to pt tolerance no pain    PATIENT EDUCATION:  Education details: POC HEP Person educated: Patient Education method: Consulting civil engineer, Media planner, Corporate treasurer cues, Verbal cues, and Handouts Education comprehension: verbalized understanding, returned demonstration, verbal cues required, tactile cues required, and needs further education  HOME EXERCISE PROGRAM: Access Code: [email or web address] URL: [email or web address] Date:  12/06/2021 Prepared by: Gillermo Murdoch  Exercises - Supine Transversus Abdominis Bracing with Pelvic Floor Contraction  - 2 x daily - 7 x weekly - 1 sets - 10 reps - 10sec  hold - Prone Press Up  - 2 x daily - 7 x weekly - 1 sets - 10 reps - 2-3 sec  hold - Standing Lumbar Extension  - 2 x daily - 7 x weekly - 1 sets - 2-3 reps - 2-3 sec  hold - Sit to Stand  - 2 x daily - 7 x weekly - 1 sets - 10 reps - 3-5 sec  hold - Standing Plank on Wall  - 2 x daily - 7 x weekly - 1 sets - 3 reps - 30 sec  hold  Patient Education - Posture and Body Mechanics  ASSESSMENT:  CLINICAL IMPRESSION: Pt with increased pain with quadruped hip extension today - modified to alt UE lift with focus on core strength. Continued focus on trunk extension to reduce pain. Added pallof press for strengthening  GOALS: Goals reviewed with patient? Yes  SHORT TERM GOALS: Target date: 01/03/2022  Independent in initial HEP  Baseline: Goal status: INITIAL  2.  Decrease LBP by 50% for standing > 15 min  Baseline:  Goal status: INITIAL    LONG TERM GOALS: Target date: 01/31/22  Improve core strength and stability with patient to tolerated increased functional activities (ability to stand to shop for 20 > minutes with no increase in baseline pain Baseline:  Goal status: INITIAL  2.  Improve trunk and LE mobility to WFL's with minimal pain or discomfort  Baseline:  Goal status: INITIAL  3.  Patient tolerate 30 min of core strengthening and stabilization exercises  Baseline:  Goal status: INITIAL  4.  Patient reports and demonstrates proper body mechanics and lifting for ADL's and work tasks   Baseline:  Goal status: INITIAL  5.  Independent in HEP including aquatic program as indicated  Baseline:  Goal status: INITIAL  6.  Improve functional limitation score to 47 Baseline:  Goal status: INITIAL  PLAN:  PT FREQUENCY: 2x/week  PT DURATION: 8 weeks  PLANNED INTERVENTIONS: Therapeutic exercises,  Therapeutic activity, Neuromuscular re-education, Balance training, Gait training, Patient/Family education, Self Care, Joint mobilization, Electrical stimulation, Spinal mobilization, Cryotherapy, Moist heat, Taping, Ultrasound, Ionotophoresis '4mg'$ /ml Dexamethasone, and Manual therapy.  PLAN FOR NEXT SESSION: review and progress exercise focus on core stabilization and strengthening; modalities and manual work  as indicated    Plumer Mittelstaedt, PT 12/26/2021, 1:52 PM

## 2021-12-27 ENCOUNTER — Ambulatory Visit (INDEPENDENT_AMBULATORY_CARE_PROVIDER_SITE_OTHER): Payer: BC Managed Care – PPO | Admitting: Family Medicine

## 2021-12-27 ENCOUNTER — Encounter: Payer: Self-pay | Admitting: Family Medicine

## 2021-12-27 VITALS — BP 132/74 | HR 84 | Wt 260.0 lb

## 2021-12-27 DIAGNOSIS — M545 Low back pain, unspecified: Secondary | ICD-10-CM | POA: Diagnosis not present

## 2021-12-27 MED ORDER — PREDNISONE 20 MG PO TABS: 40.0000 mg | ORAL_TABLET | Freq: Every day | ORAL | 0 refills | Status: DC

## 2021-12-27 NOTE — Addendum Note (Signed)
Addended by: [name] D on: 12/27/2021 12:44 PM   Modules accepted: Orders

## 2021-12-27 NOTE — Patient Instructions (Signed)
Follow up in 2 weeks to reassess back pain.

## 2021-12-27 NOTE — Progress Notes (Addendum)
   Established Patient Office Visit  Subjective   Patient ID: Brooke Kaiser, female    DOB: April 02, 1970  Age: 51 y.o. MRN: [id number]  Chief Complaint  Patient presents with   Back Pain    HPI  F/U for acute low back pain.  Radiates into both buttocks and legs. Better with sitting and rest.  Better with lidocaine and heat. Seen by Dr. Ferol Luz on 10/30 and again on 11/6.  Has been to PT 3 times. She did buy a TENS unit.  Flexeril doesn't help.   Pain is still mostly bilateral going into the buttock and she is getting some paresthesias in the anterior thighs bilaterally.  She really does not feel like her back pain is getting better.  She says most days her pain is a 4-5 out of 10.  Today her pain is more of a 7-8.    ROS    Objective:     BP 132/74   Pulse 84   Wt 260 lb (117.9 kg)   LMP 06/24/2013   SpO2 96%   BMI 38.96 kg/m    Physical Exam Vitals reviewed.  Constitutional:      Appearance: She is well-developed.  HENT:     Head: Normocephalic and atraumatic.  Eyes:     Conjunctiva/sclera: Conjunctivae normal.  Cardiovascular:     Rate and Rhythm: Normal rate.  Pulmonary:     Effort: Pulmonary effort is normal.  Musculoskeletal:     Comments: Normal lumbar flexure but she does start to experience pain at about 45 degrees.  She has significant pain with extension.  Also pain with rotation but fairly symmetric though a little less rotation to the left.  Normal side bending.  Negative straight leg raise bilaterally.  She is tender directly over the lumbar spine nontender over the paraspinous muscles.  Skin:    General: Skin is dry.     Coloration: Skin is not pale.  Neurological:     Mental Status: She is alert and oriented to person, place, and time.  Psychiatric:        Behavior: Behavior normal.      No results found for any visits on 12/27/21.    The 10-year ASCVD risk score (Arnett DK, et al., 2019) is: 8.9%    Assessment & Plan:   Problem List Items  Addressed This Visit       Other   Acute bilateral low back pain without sciatica - Primary   Relevant Medications   predniSONE (DELTASONE) 20 MG tablet   Bilateral low back pain -  she has been struggling with this for most a month now.  I encouraged her to get into more PT visits between now and 2 weeks from now and we can reassess to see whether or not she is okay to return to work.  I did go ahead and extend her work note until December 18.  Continue with TENS unit and anti-inflammatories I would like to do a trial of prednisone since it does sound like she is getting some radicular symptoms.  Though it does feel different than when she is previously had sciatica.  If not improving at that time consider MRI for further evaluation.  Return in about 2 weeks (around 01/10/2022).    [name], MD

## 2021-12-28 NOTE — Progress Notes (Signed)
I, [name], LAT, ATC acting as a scribe for [name], MD.  Brooke Kaiser is a 51 y.o. female who presents to [place] at [facility] today for re-occurrence of R hand pain due to trigger finger of the R 3rd finger. 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 Pt was last seen by Dr. Georgina Snell on 06/06/21 and was given a R 3rd digit A1 pulley steroid injection and was advised to use a double Band-Aid splint and modify the grip on her tools at work. Today, pt reports R 3rd finger pain has returned over the past month. Pt is currently out of work due to back problems. Pt notes the R 3rd finger just recently, last 1-2 days, started triggering.  She is currently out of work with back pain.  She is attending physical therapy for her back.  This is arranged by her primary care provider.  Pertinent review of systems: no fever or chills  Relevant historical information: Hypertension   Exam:  BP 128/84   Pulse 79   Ht 5' 8.5" (1.74 m)   Wt 255 lb (115.7 kg)   LMP 06/24/2013   SpO2 95%   BMI 38.21 kg/m  General: Well Developed, well nourished, and in no acute distress.   MSK: Right hand normal appearing Tender palpation third palmar MCP.  Triggering present with flexion of the PIP joint.    Lab and Radiology Results  Procedure: Real-time Ultrasound Guided Injection of right third tendon sheath at A1 pulley (trigger finger injection) Device: Philips Affiniti 50G Images permanently stored and available for review in PACS Verbal informed consent obtained.  Discussed risks and benefits of procedure. Warned about infection, bleeding, hyperglycemia damage to structures among others. Patient expresses understanding and agreement Time-out conducted.   Noted no overlying erythema, induration, or other signs of local infection.   Skin prepped in a sterile fashion.   Local anesthesia: Topical Ethyl chloride.   With sterile  technique and under real time ultrasound guidance: 0.5 mL of 80 mg/mL Depo-Medrol solution and 0.5 mL of lidocaine injected into tendon sheath at the third A1 pulley. Fluid seen entering the tendon sheath.   Completed without difficulty   Pain immediately resolved suggesting accurate placement of the medication.   Advised to call if fevers/chills, erythema, induration, drainage, or persistent bleeding.   Images permanently stored and available for review in the ultrasound unit.  Impression: Technically successful ultrasound guided injection.        Assessment and Plan: 51 y.o. female with trigger finger right hand.  This is a recurrent problem.  She had an injection in July 2022 and again May 2023 and then again now.  The interval is getting narrower.  Will proceed with injection today but she may need surgery in the future.  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  Check back as needed for this issue.  Additionally she is currently attending physical therapy for her back pain.  Recommend check back with me in 1 month we will assess the back and consider further treatment if needed for the chronic back pain.  Consider MRI at that time.   PDMP not reviewed this encounter. Orders Placed This Encounter  Procedures   [place] LIMITED JOINT SPACE STRUCTURES UP RIGHT(NO LINKED CHARGES)    Order  Specific Question:   Reason for Exam (SYMPTOM  OR DIAGNOSIS REQUIRED)    Answer:   right hand pain    Order Specific Question:   Preferred imaging location?    Answer:   [place]   Meds ordered this encounter  Medications   methylPREDNISolone acetate (DEPO-MEDROL) injection 80 mg     Discussed warning signs or symptoms. Please see discharge instructions. Patient expresses understanding.   The above documentation has been reviewed and is accurate  and complete [name], M.D.

## 2021-12-29 ENCOUNTER — Ambulatory Visit: Payer: BC Managed Care – PPO | Admitting: Family Medicine

## 2021-12-29 ENCOUNTER — Ambulatory Visit: Payer: Self-pay

## 2021-12-29 VITALS — BP 128/84 | HR 79 | Ht 68.5 in | Wt 255.0 lb

## 2021-12-29 DIAGNOSIS — M65331 Trigger finger, right middle finger: Secondary | ICD-10-CM

## 2021-12-29 MED ORDER — METHYLPREDNISOLONE ACETATE 80 MG/ML IJ SUSP
80.0000 mg | Freq: Once | INTRAMUSCULAR | Status: AC
  Administered 2021-12-29: 80 mg via INTRA_ARTICULAR

## 2021-12-29 NOTE — Patient Instructions (Addendum)
Thank you for coming in today.   Call or go to the ER if you develop a large red swollen joint with extreme pain or oozing puss.    Use the double bandaid splint.   Recheck with me in 1 month. We will evaluate your back and your hand.   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

## 2022-01-02 ENCOUNTER — Ambulatory Visit: Payer: BC Managed Care – PPO | Attending: Family Medicine | Admitting: Rehabilitative and Restorative Service Providers"

## 2022-01-02 ENCOUNTER — Encounter: Payer: Self-pay | Admitting: Rehabilitative and Restorative Service Providers"

## 2022-01-02 DIAGNOSIS — M6281 Muscle weakness (generalized): Secondary | ICD-10-CM | POA: Diagnosis not present

## 2022-01-02 DIAGNOSIS — R29898 Other symptoms and signs involving the musculoskeletal system: Secondary | ICD-10-CM | POA: Insufficient documentation

## 2022-01-02 DIAGNOSIS — M545 Low back pain, unspecified: Secondary | ICD-10-CM | POA: Insufficient documentation

## 2022-01-02 NOTE — Therapy (Addendum)
OUTPATIENT PHYSICAL THERAPY THORACOLUMBAR TREATMENT AND DISCHARGE SUMMARY   PHYSICAL THERAPY DISCHARGE SUMMARY  Visits from Start of Care: 4  Current functional level related to goals / functional outcomes: See progress note for discharge status    Remaining deficits: Unknown    Education / Equipment: HEP    Patient agrees to discharge. Patient goals were not met. Patient is being discharged due to not returning since the last visit. Sanaiya Welliver P. 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  9-05   Patient Active Problem List   Diagnosis Date Noted   Acute bilateral low back pain without sciatica 11/27/2021   Snoring 03/16/2021   Trigger middle finger of right hand 08/03/2020   Elevated hemoglobin (HCC) 03/04/2018   Vitamin D deficiency 03/04/2018   Gouty arthritis 11/04/2013   ANA positive 09/22/2013   Muscle ache 09/22/2013   Collagen disease ([place]) 09/22/2013   Elevated erythrocyte sedimentation rate 09/22/2013   IFG (impaired fasting glucose) 06/24/2012   Asthmatic bronchitis 10/16/2011   GAD (generalized anxiety disorder) 07/30/2011   Essential hypertension 04/07/2010   UNSPECIFIED ANEMIA 03/29/2010   Severe episode of recurrent major depressive disorder ([place]) 07/22/2009   OTHER ABNORMAL GLUCOSE 05/25/2008   HYPERTRIGLYCERIDEMIA 11/04/2007   FATIGUE  11/04/2007   EXTERNAL HEMORRHOIDS 07/22/2007   BACK PAIN 07/22/2007   TOBACCO ABUSE 07/01/2007   GERD 07/01/2007   ABNORMAL THYROID FUNCTION TESTS 04/22/2007    PCP: Dr [name]  REFERRING PROVIDER: Dr Rhona Leavens, DO  REFERRING DIAG: Acute bilat LBP  Rationale for Evaluation and Treatment: Rehabilitation  THERAPY DIAG:  Acute bilateral low back pain without sciatica  Other symptoms and signs involving the musculoskeletal system  Muscle weakness (generalized)  ONSET DATE: 05/13/21  SUBJECTIVE:  SUBJECTIVE STATEMENT: Pt states she still has pain with prolonged standing or sitting. Meds and TENS help decrease pain. She states she is doing some exercises every day and uses the TENS unit every day.  PERTINENT HISTORY:  Back pain for several years.   PAIN:  Are you having pain? Yes: NPRS scale: 3/10 Pain location: base of LB  Pain description: aching; muscle spasms Aggravating factors: lying in bed too long; work activities;  twisting; bending Relieving factors: meds  PRECAUTIONS: None  WEIGHT BEARING RESTRICTIONS: No  FALLS:  Has patient fallen in last 6 months? No  PATIENT GOALS: manage pain   NEXT MD VISIT: 01/12/22  OBJECTIVE:  Palpation: 01/02/22: Stiffness and pain with PA mobs lumbar spine   LUMBAR ROM:   AROM eval  Flexion 100% pulling   Extension 50% discomfort   Right lateral flexion 75% stretching Lt lumbar  Left lateral flexion 75% stretching on Rt; pain Lt   Right rotation 50% Rt LBP  Left rotation 40% Lt LBP    (Blank rows = not tested)  LOWER EXTREMITY ROM:     End range tightness hip extension and rotation bilat   LOWER EXTREMITY MMT:     Core weakness noted with transitional movements and posture  MMT Right eval Left eval  Hip flexion  4+/5 4+/5  Hip extension 4-/5 4-/5  Hip abduction 4/5 4/5  Hip adduction    Hip internal rotation    Hip external rotation    Knee flexion 5/5 5/5  Knee extension 5/5 5/5  Ankle dorsiflexion    Ankle plantarflexion    Ankle inversion    Ankle eversion     (Blank rows = not tested)  LUMBAR SPECIAL TESTS:  SLS 10 sec bilat  Slump test (-) SLS (-)  (+) 3 finger rectus diastasis   TODAY'S TREATMENT:                                                                                                                              [facility] Adult PT Treatment:                                    DATE: 01/02/22: Therapeutic exercise:  Treadmill - 1.0 to 1.3 mph x 6 min  Core activation - transverse abdominals 10 sec x 10  Prone press up partial range to tolerance 2 sec x 10 Sit to standing core engaged hip hinge x 10 Bridging x 10 with core engaged Sit<>stand x 10  Wall plank 60 sec x 1 on hands; 60 sec x 2 on forearms  Shoulder extension blue TB 2 x 10 Rows blue TB 2 x 10 Pallof blue one strip TB x 10 bilat Step up 6 inch step x 10 each LE Wall squat w/swiss ball 5 sec hold x 10   DATE: 12/26/21 Therapeutic Exercise: Nustep L5 x 5 min  for warm up Shoulder extension green TB 2 x 10 Rows green TB 2 x 10 Pallof red TB x 10 bilat Sit <> stand 2 x 5 Cat/camel x 10 Attempt hip ext in quadruped - limited by pain Alt UE lift in quadruped x 2 bilat Bridge 2 x 5 Transverse ab activation 5 sec hold 10 Prone on elbows x 2 min   PATIENT EDUCATION:  Education details: POC HEP Person educated: Patient Education method: Consulting civil engineer, Media planner, Corporate treasurer cues, Verbal cues, and Handouts Education comprehension: verbalized understanding, returned demonstration, verbal cues required, tactile cues required, and needs further education  HOME EXERCISE PROGRAM: Access Code: [email or web address] URL: [email or web address] Date: 01/02/2022 Prepared by: Gillermo Murdoch  Exercises - Supine  Transversus Abdominis Bracing with Pelvic Floor Contraction  - 2 x daily - 7 x weekly - 1 sets - 10 reps - 10sec  hold - Prone Press Up  - 2 x daily - 7 x weekly - 1 sets - 10 reps - 2-3 sec  hold - Standing Lumbar Extension  - 2 x daily - 7 x weekly - 1 sets - 2-3 reps - 2-3 sec  hold - Sit to Stand  - 2 x daily - 7 x weekly - 1 sets - 10 reps - 3-5 sec  hold - Seated Hip Flexor Stretch  - 2 x daily - 7 x weekly - 1 sets - 3 reps - 30 sec  hold - Cat Cow  - 2 x daily - 7 x weekly - 1 sets - 5-10 reps - 3-5 sec  hold - Beginner Front Arm Support  - 2 x daily - 7 x weekly - 1 sets - 10 reps - 3 sec  hold - Prone Press Up On Elbows  - 2 x daily - 7 x weekly - 1 sets - 3 reps - 30 sec  hold - Standing Piriformis Release with Ball at Wall  - 2 x daily - 7 x weekly - 30-60 sec  hold - Standing Bilateral Low Shoulder Row with Anchored Resistance  - 2 x daily - 7 x weekly - 1-3 sets - 10 reps - 2-3 sec  hold - Shoulder extension with resistance - Neutral  - 1 x daily - 7 x weekly - 1-2 sets - 10 reps - 3-5 sec  hold - Anti-Rotation Lateral Stepping with Press  - 2 x daily - 7 x weekly - 1-2 sets - 10 reps - 2-3 sec  hold - Forearm Plank on Wall  - 2 x daily - 7 x weekly - 1 sets - 3 reps - 60 sec  hold - Step Up  - 2 x daily - 7 x weekly - 1-2 sets - 10 reps -   hold - Wall Quarter Squat  - 2 x daily - 7 x weekly - 1-2 sets - 10 reps - 5-10 sec  hold  Patient Education - Posture and Body Mechanics  ASSESSMENT:  CLINICAL IMPRESSION: Continued pain with patient reporting minimal change. She has central LBP and some intermittent sensation of numbness in bilat lateral hip/thigh area. Patient has tightness and reports increased pain with PA mobs lumbar spine. Modified UE wall plank to forearm support due to history of carpal tunnel syndrome. Continue focus on core strength and stabilization as well as trunk extension to reduce pain.  GOALS: Goals reviewed with patient? Yes  SHORT TERM GOALS: Target  date: 01/03/2022  Independent in initial HEP  Baseline: Goal status: INITIAL  2.  Decrease  LBP by 50% for standing > 15 min  Baseline:  Goal status: INITIAL    LONG TERM GOALS: Target date: 01/31/22  Improve core strength and stability with patient to tolerated increased functional activities (ability to stand to shop for 20 > minutes with no increase in baseline pain Baseline:  Goal status: INITIAL  2.  Improve trunk and LE mobility to WFL's with minimal pain or discomfort  Baseline:  Goal status: INITIAL  3.  Patient tolerate 30 min of core strengthening and stabilization exercises  Baseline:  Goal status: INITIAL  4.  Patient reports and demonstrates proper body mechanics and lifting for ADL's and work tasks   Baseline:  Goal status: INITIAL  5.  Independent in HEP including aquatic program as indicated  Baseline:  Goal status: INITIAL  6.  Improve functional limitation score to 47 Baseline:  Goal status: INITIAL  PLAN:  PT FREQUENCY: 2x/week  PT DURATION: 8 weeks  PLANNED INTERVENTIONS: Therapeutic exercises, Therapeutic activity, Neuromuscular re-education, Balance training, Gait training, Patient/Family education, Self Care, Joint mobilization, Electrical stimulation, Spinal mobilization, Cryotherapy, Moist heat, Taping, Ultrasound, Ionotophoresis '4mg'$ /ml Dexamethasone, and Manual therapy.  PLAN FOR NEXT SESSION: review and progress exercise focus on core stabilization and strengthening; modalities and manual work as indicated    [employer], PT, MPH  01/02/2022, 3:41 PM

## 2022-01-04 ENCOUNTER — Ambulatory Visit: Payer: BC Managed Care – PPO | Admitting: Rehabilitative and Restorative Service Providers"

## 2022-01-05 NOTE — Telephone Encounter (Signed)
error 

## 2022-01-09 ENCOUNTER — Ambulatory Visit: Payer: BC Managed Care – PPO | Admitting: Physical Therapy

## 2022-01-10 ENCOUNTER — Ambulatory Visit (INDEPENDENT_AMBULATORY_CARE_PROVIDER_SITE_OTHER): Payer: BC Managed Care – PPO | Admitting: Family Medicine

## 2022-01-10 ENCOUNTER — Encounter: Payer: Self-pay | Admitting: Family Medicine

## 2022-01-10 VITALS — BP 142/70 | HR 83 | Ht 68.0 in | Wt 257.0 lb

## 2022-01-10 DIAGNOSIS — M545 Low back pain, unspecified: Secondary | ICD-10-CM | POA: Diagnosis not present

## 2022-01-10 NOTE — Patient Instructions (Signed)
Schedule an appoint with Dr. Bertram Millard for next week.

## 2022-01-10 NOTE — Progress Notes (Signed)
   Acute Office Visit  Subjective:     Patient ID: Brooke Kaiser, female    DOB: 05/26/1970, 51 y.o.   MRN: [id number]  Chief Complaint  Patient presents with   Follow-up    Back injury     HPI Patient is in today for following up of persistent midline low back pain.  It still bilateral radiating to both buttock areas.  Occasionally she will even feel numbness in her anterior thighs bilaterally.  She really does not feel like she is any better.  She did go to 1 more PT session since she was last here but felt like it really was not helping so did not return.  She is been mostly relying on ibuprofen and Tylenol TENS unit and heat.  It is worse with prolonged sitting and prolonged standing and prolonged walking.  Rates her pain a 5 out of 10 today.  The pain has been waking her up at night.  Pain is mostly focused in the lower lumbar spine just above the buttock crease.  It radiates bilaterally into both buttock cheeks.  ROS      Objective:    BP (!) 142/70 (BP Location: Left Arm, Patient Position: Sitting, Cuff Size: Large)   Pulse 83   Ht '5\' 8"'$  (1.727 m)   Wt 257 lb 0.6 oz (116.6 kg)   LMP 06/24/2013   SpO2 95%   BMI 39.08 kg/m    Physical Exam Vitals reviewed.  Constitutional:      Appearance: She is well-developed.  HENT:     Head: Normocephalic and atraumatic.  Eyes:     Conjunctiva/sclera: Conjunctivae normal.  Cardiovascular:     Rate and Rhythm: Normal rate.  Pulmonary:     Effort: Pulmonary effort is normal.  Skin:    General: Skin is dry.     Coloration: Skin is not pale.  Neurological:     Mental Status: She is alert and oriented to person, place, and time.  Psychiatric:        Behavior: Behavior normal.     No results found for any visits on 01/10/22.      Assessment & Plan:   Problem List Items Addressed This Visit   None Visit Diagnoses     Acute midline low back pain without sciatica    -  Primary   Relevant Orders   MR LUMBAR SPINE WO  CONTRAST      She has had 6 weeks of conservative treatment.  I would like to move forward with MRI for further workup.  She does see sports medicine doc, Dr. Georgina Snell for other orthopedic issues so did encourage her to go ahead and schedule an appointment with him next week if possible.  Continue ibuprofen/Tylenol as needed.  Flexeril as needed.  He did bring forms to be completed for work.  Her original return date was Monday.  Medical certification of healthcare provider completed and placed in [place] B box.  No orders of the defined types were placed in this encounter.   No follow-ups on file.  [name], MD

## 2022-01-18 ENCOUNTER — Telehealth: Payer: Self-pay

## 2022-01-18 MED ORDER — LORAZEPAM 0.5 MG PO TABS: ORAL_TABLET | ORAL | 0 refills | Status: DC

## 2022-01-18 NOTE — Telephone Encounter (Signed)
Meds ordered this encounter  Medications   LORazepam (ATIVAN) 0.5 MG tablet    Sig: Take 1 tab about 1 hour before procedure.  Okay to repeat dose in 1 hour if needed.  Please have a driver.    Dispense:  2 tablet    Refill:  0

## 2022-01-18 NOTE — Telephone Encounter (Signed)
Patient request ativan '1mg'$  #1 to help with the MRI she is scheduled for on 12/24. She would like this sent to [facility] [place].

## 2022-01-18 NOTE — Progress Notes (Signed)
I, [name], LAT, ATC acting as a scribe for [name], MD.  Brooke Kaiser is a 51 y.o. female who presents to [place] at [facility] today for low back pain w/ MRI review.  Patient was previously seen by Dr. Georgina Snell on 12/29/2021 for trigger finger of the right third finger. Pt's LBP was previously managed by her PCP, who ordered a MRI. Today, patient reports back pain ongoing for at least 6 month, worsening. Patient locates pain to the midline of her low back w/ radiating pain through bilat buttocks/hips.  She has continued back pain.  She has difficulty lifting bending and stooping.  She remains out of work.  Radiating pain: yes LE numbness/tingling: yes- anterior thighs, bilat LE weakness: yes Aggravates: trying to work, trunk flexion, bending down to pick objects up, laying supine Treatments tried: PT, IBU, Tylenol, TENS unit, heat, stretching, lidocaine sticks  Dx imaging: 01/21/22 L-spine MRI  06/27/21 L-spine XR  Pertinent review of systems: No fevers or chills  Relevant historical information: Hypertension   Exam:  BP (!) 142/74   Pulse 84   Ht '5\' 8"'$  (1.727 m)   Wt 260 lb (117.9 kg)   LMP 06/24/2013   SpO2 97%   BMI 39.53 kg/m  General: Well Developed, well nourished, and in no acute distress.   MSK: L-spine: Normal appearing Nontender to palpation spinal midline.  Decreased lumbar motion.  Lower extremity strength is intact.  Reflexes are intact.    Lab and Radiology Results  MRI lumbar spine images personally independently interpreted.  MRI was completed on January 21, 2022 and the radiology read is not yet back.  Per my interpretation dominant neuroforaminal stenosis is present bilaterally right worse than left at L4-5. Additionally she has bilateral facet DJD L4-5 and L5-S1. Await formal radiology review    Assessment and Plan: 51 y.o. female with bilateral chronic low back pain with bilateral anterior thigh paresthesias and  pain.  The anterior thigh paresthesias and pain clinically most fits with L3.  However per my interpretation of the lumbar spine MRI she does not have a lot of nerve impingement at this level.  She does potentially have L4 nerve impingement bilaterally which could fit. Plan for trial of an epidural steroid injection.  Await results of MRI.  Additionally she has chronic low back pain.  This is failed to improve with physical therapy.  She is a good candidate for potential facet injections followed if needed by nerve root block and ablation at bilateral L4-5 and L5-S1. She will let me know how she feels after the epidural steroid injection and we can proceed to the facet treatments if needed.  As for her work status she remains out of work until mid February.  I selected February 15 as a potential return to work date.  This will give [place] plenty of time to get these back injections and procedures completed.   PDMP not reviewed this encounter. Orders Placed This Encounter  Procedures   DG INJECT DIAG/THERA/INC NEEDLE/CATH/PLC EPI/LUMB/SAC W/IMG    Standing Status:   Future    Standing Expiration Date:   01/24/2023    Order Specific Question:   Reason for Exam (SYMPTOM  OR DIAGNOSIS REQUIRED)    Answer:   ESI or NRB or both. Lumbar radiculopathy at around L3 or L4 BL. Level and technique per radiology    Order Specific Question:   Is the patient pregnant?    Answer:   No  Order Specific Question:   Preferred Imaging Location?    Answer:   [id number]    Order Specific Question:   Radiology Contrast Protocol - do NOT remove file path    Answer:   [email or web address]   No orders of the defined types were placed in this encounter.    Discussed warning signs or symptoms. Please see discharge instructions. Patient expresses understanding.   The above documentation has been reviewed and is accurate and complete [name], M.D.

## 2022-01-19 NOTE — Telephone Encounter (Signed)
Left msg for patient that med was sent to pharmacy as requested.

## 2022-01-21 ENCOUNTER — Ambulatory Visit (INDEPENDENT_AMBULATORY_CARE_PROVIDER_SITE_OTHER): Payer: BC Managed Care – PPO

## 2022-01-21 ENCOUNTER — Encounter: Payer: Self-pay | Admitting: Family Medicine

## 2022-01-21 DIAGNOSIS — M47816 Spondylosis without myelopathy or radiculopathy, lumbar region: Secondary | ICD-10-CM | POA: Diagnosis not present

## 2022-01-21 DIAGNOSIS — M5126 Other intervertebral disc displacement, lumbar region: Secondary | ICD-10-CM | POA: Diagnosis not present

## 2022-01-21 DIAGNOSIS — M545 Low back pain, unspecified: Secondary | ICD-10-CM

## 2022-01-23 ENCOUNTER — Ambulatory Visit: Payer: BC Managed Care – PPO | Admitting: Family Medicine

## 2022-01-23 VITALS — BP 142/74 | HR 84 | Ht 68.0 in | Wt 260.0 lb

## 2022-01-23 DIAGNOSIS — M5442 Lumbago with sciatica, left side: Secondary | ICD-10-CM

## 2022-01-23 DIAGNOSIS — G8929 Other chronic pain: Secondary | ICD-10-CM

## 2022-01-23 DIAGNOSIS — M5441 Lumbago with sciatica, right side: Secondary | ICD-10-CM

## 2022-01-23 DIAGNOSIS — M47816 Spondylosis without myelopathy or radiculopathy, lumbar region: Secondary | ICD-10-CM | POA: Diagnosis not present

## 2022-01-23 NOTE — Patient Instructions (Addendum)
Thank you for coming in today.   Please call [facility] at [phone] to schedule your spine injection.    Use heat and TENS.   Add a percussive massage device.   Report back after the first injection  Send me a mychart message once you get the results of the MRI likely tomorrow.    Ok to cancel the appointment on the 5th.

## 2022-01-24 ENCOUNTER — Encounter: Payer: Self-pay | Admitting: Family Medicine

## 2022-01-24 NOTE — Progress Notes (Signed)
HI Giebler,   MRI shows disc issues at 4 levels, L2-L3, L3-4, and L4-L5 and L5-S1. Also has some bone spurs.  I will forward this information to Dr. Georgina Snell as well just so he is in the loop I know you are scheduled for an injection.  Also you still have a large cyst in the right pelvis.  Have you seen gynecology for this? If not I would ike to place a referral for this.

## 2022-01-25 ENCOUNTER — Other Ambulatory Visit: Payer: Self-pay | Admitting: Family Medicine

## 2022-01-25 ENCOUNTER — Other Ambulatory Visit: Payer: Self-pay | Admitting: *Deleted

## 2022-01-25 DIAGNOSIS — N83201 Unspecified ovarian cyst, right side: Secondary | ICD-10-CM

## 2022-01-26 ENCOUNTER — Other Ambulatory Visit (HOSPITAL_COMMUNITY): Payer: Self-pay | Admitting: Psychiatry

## 2022-01-26 NOTE — Progress Notes (Signed)
Orders Placed This Encounter  Procedures   Ambulatory referral to Obstetrics / Gynecology    Referral Priority:   Routine    Referral Type:   Consultation    Referral Reason:   Specialty Services Required    Requested Specialty:   Obstetrics and Gynecology    Number of Visits Requested:   1

## 2022-02-01 ENCOUNTER — Encounter: Payer: Self-pay | Admitting: Family Medicine

## 2022-02-01 NOTE — Telephone Encounter (Signed)
This sounds like a release of information form. Who does she contact?

## 2022-02-02 ENCOUNTER — Ambulatory Visit: Payer: BC Managed Care – PPO | Admitting: Family Medicine

## 2022-02-05 ENCOUNTER — Ambulatory Visit
Admission: RE | Admit: 2022-02-05 | Discharge: 2022-02-05 | Disposition: A | Discharge status: Home / Self Care | Payer: BC Managed Care – PPO | Source: Ambulatory Visit | Attending: Family Medicine | Admitting: Family Medicine

## 2022-02-05 DIAGNOSIS — M47816 Spondylosis without myelopathy or radiculopathy, lumbar region: Secondary | ICD-10-CM | POA: Diagnosis not present

## 2022-02-05 DIAGNOSIS — G8929 Other chronic pain: Secondary | ICD-10-CM

## 2022-02-05 MED ORDER — IOPAMIDOL (ISOVUE-M 200) INJECTION 41%
1.0000 mL | Freq: Once | INTRAMUSCULAR | Status: AC
  Administered 2022-02-05: 1 mL via EPIDURAL

## 2022-02-05 MED ORDER — METHYLPREDNISOLONE ACETATE 40 MG/ML INJ SUSP (RADIOLOG
80.0000 mg | Freq: Once | INTRAMUSCULAR | Status: AC
  Administered 2022-02-05: 80 mg via EPIDURAL

## 2022-02-05 NOTE — Discharge Instructions (Signed)

## 2022-02-09 ENCOUNTER — Other Ambulatory Visit: Payer: Self-pay | Admitting: Family Medicine

## 2022-02-14 ENCOUNTER — Other Ambulatory Visit (HOSPITAL_COMMUNITY): Payer: Self-pay | Admitting: Psychiatry

## 2022-02-14 ENCOUNTER — Other Ambulatory Visit: Payer: Self-pay | Admitting: Family Medicine

## 2022-02-16 ENCOUNTER — Encounter (HOSPITAL_COMMUNITY): Payer: Self-pay | Admitting: Psychiatry

## 2022-02-16 ENCOUNTER — Telehealth (INDEPENDENT_AMBULATORY_CARE_PROVIDER_SITE_OTHER): Payer: BC Managed Care – PPO | Admitting: Psychiatry

## 2022-02-16 DIAGNOSIS — F331 Major depressive disorder, recurrent, moderate: Secondary | ICD-10-CM

## 2022-02-16 DIAGNOSIS — F0630 Mood disorder due to known physiological condition, unspecified: Secondary | ICD-10-CM

## 2022-02-16 DIAGNOSIS — F411 Generalized anxiety disorder: Secondary | ICD-10-CM | POA: Diagnosis not present

## 2022-02-16 MED ORDER — VENLAFAXINE HCL ER 75 MG PO CP24: 225.0000 mg | ORAL_CAPSULE | Freq: Every day | ORAL | 0 refills | Status: DC

## 2022-02-16 MED ORDER — LAMOTRIGINE 100 MG PO TABS: 100.0000 mg | ORAL_TABLET | Freq: Every day | ORAL | 0 refills | Status: DC

## 2022-02-16 NOTE — Progress Notes (Signed)
Patient ID: Brooke Kaiser, female   DOB: 06-28-1970, 52 y.o.   MRN: [id number] [facility] MD Progress Note  02/16/2022 11:32 AM Brooke Kaiser  MRN:  [id number]   Virtual Visit via Video Note  I connected with Brooke Kaiser on 02/16/22 at 11:30 AM EST by a video enabled telemedicine application and verified that I am speaking with the correct person using two identifiers.  Location: Patient: car Provider: home office   I discussed the limitations of evaluation and management by telemedicine and the availability of in person appointments. The patient expressed understanding and agreed to proceed.     I discussed the assessment and treatment plan with the patient. The patient was provided an opportunity to ask questions and all were answered. The patient agreed with the plan and demonstrated an understanding of the instructions.   The patient was advised to call back or seek an in-person evaluation if the symptoms worsen or if the condition fails to improve as anticipated.  I provided  15 minutes  of non-face-to-face time during this encounter.    HPI; doing fair, stress related to mom who has cancer, gets paranoid but doesn't get hellp Partner and her job is going well Tries to take ME time but not that much   effexor helps anxiety Modifying factor: job  Aggravating factor: mom sickness Severity of depression: manageable No rash  No associated psychotic symptoms or mania.  Principal Problem: GAD and MDD Diagnosis:   Patient Active Problem List   Diagnosis Date Noted   Acute bilateral low back pain without sciatica [M54.50] 11/27/2021   Snoring [R06.83] 03/16/2021   Trigger middle finger of right hand [M65.331] 08/03/2020   Elevated hemoglobin ([place]) [D58.2] 03/04/2018   Vitamin D deficiency [E55.9] 03/04/2018   Gouty arthritis [M10.9] 11/04/2013   ANA positive [R76.8] 09/22/2013   Muscle ache [M79.10] 09/22/2013   Collagen disease ([place]) [M35.9] 09/22/2013   Elevated  erythrocyte sedimentation rate [R70.0] 09/22/2013   IFG (impaired fasting glucose) [R73.01] 06/24/2012   Asthmatic bronchitis [J45.909] 10/16/2011   GAD (generalized anxiety disorder) [F41.1] 07/30/2011   Essential hypertension [I10] 04/07/2010   UNSPECIFIED ANEMIA [D64.9] 03/29/2010   Severe episode of recurrent major depressive disorder ([place]) [F33.2] 07/22/2009   OTHER ABNORMAL GLUCOSE [R73.09] 05/25/2008   HYPERTRIGLYCERIDEMIA [E78.1] 11/04/2007   FATIGUE [R53.81, R53.83] 11/04/2007   EXTERNAL HEMORRHOIDS [K64.4] 07/22/2007   BACK PAIN [M54.9] 07/22/2007   TOBACCO ABUSE [F17.200] 07/01/2007   GERD [K21.9] 07/01/2007   ABNORMAL THYROID FUNCTION TESTS [R94.6] 04/22/2007   Total Time spent with patient: 25 minutes   Past Medical History:  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  9-05   Family History:  Family History  Problem Relation Age of Onset   Cancer Mother 12       Breast   Colon polyps Mother    Other Mother        colon polyps   Dementia Mother    Anxiety disorder Mother    Depression Mother    Paranoid behavior Mother    Depression Father    Suicidality Father        commited suicide   Alcohol abuse Father    Colon polyps Maternal  Aunt        cancerous colon polyps   Cancer Maternal Uncle        cancerous colon polyps   Heart disease Other    Other Other        thyroid disorder   Alcohol abuse Paternal Uncle    Dementia Maternal Grandmother    Alcohol abuse Paternal Grandmother    Alcohol abuse Paternal Uncle    Social History:  Social History   Substance and Sexual Activity  Alcohol Use No   Alcohol/week: 0.3 standard drinks of alcohol     Social History   Substance and Sexual Activity  Drug Use No    Social History    Socioeconomic History   Marital status: Significant Other    Spouse name: Not on file   Number of children: Not on file   Years of education: Not on file   Highest education level: Not on file  Occupational History   Occupation: [employer]    Employer: [employer]  Tobacco Use   Smoking status: Every Day    Packs/day: 1.00    Years: 27.00    Total pack years: 27.00    Types: Cigarettes   Smokeless tobacco: Never   Tobacco comments:    1 pack a day   Vaping Use   Vaping Use: Never used  Substance and Sexual Activity   Alcohol use: No    Alcohol/week: 0.3 standard drinks of alcohol   Drug use: No   Sexual activity: Yes    Partners: Female    Comment: prefers female sex partner  Other Topics Concern   Not on file  Social History Narrative   Not on file   Social Determinants of Health   Financial Resource Strain: Not on file  Food Insecurity: Not on file  Transportation Needs: Not on file  Physical Activity: Not on file  Stress: Not on file  Social Connections: Not on file   Additional History:    Sleep: Fair  Appetite:  Fair   Assessment: GAD stable, MDD stable      Psychiatric Specialty Exam: Physical Exam  Review of Systems  Cardiovascular:  Negative for chest pain.  Skin:  Negative for rash.  Psychiatric/Behavioral:  Positive for depression. Negative for hallucinations and suicidal ideas.     Last menstrual period 06/24/2013.There is no height or weight on file to calculate BMI.  General Appearance: Neat and Well Groomed  Engineer, water::  Good  Speech:  Clear and Coherent  Volume:  Normal  Mood: Fair  Affect:  congruent  Thought Process:  Goal Directed, Intact, Linear and Logical  Orientation:  Full (Time, Place, and Person)  Thought Content:  WDL  Suicidal Thoughts:  No  Homicidal Thoughts:  No  Memory:  Immediate;   Good Recent;   Good Remote;   Good  Judgement:  Good  Insight:  Present  Psychomotor Activity:  Normal   Concentration:  Good  Recall:  Good  Fund of Knowledge:Good  Language: Good  Akathisia:  No  Handed:  Right  AIMS (if indicated):     Assets:  Communication Skills Desire for [place] Talents/Skills Transportation Vocational/Educational  ADL's:  Intact  Cognition: WNL  Sleep:  Fair  Notes she is sleeping during the day.     Current Medications: Current Outpatient Prescriptions  Medication Sig Dispense Refill   lamoTRIgine (LAMICTAL) 100 MG tablet Take one tablet at day for  mood stabilization. 30 tablet 1   losartan-hydrochlorothiazide (HYZAAR) 100-25 MG per tablet Take 1 tablet by mouth daily. 30 tablet 5   venlafaxine XR (EFFEXOR-XR) 75 MG 24 hr capsule Take 3 capsules (225 mg total) by mouth daily with breakfast. 90 capsule 0   VENTOLIN HFA 108 (90 BASE) MCG/ACT inhaler INHALE 4 PUFFS INTO THE LUNGS 2 TIMES DAILY AS NEEDED. 18 Inhaler 3   No current facility-administered medications for this visit.    Lab Results:  No results found for this or any previous visit (from the past 48 hour(s)).  Physical Findings: AIMS: CIWA:   COWS:    Treatment Plan Summary: Medication management  Prior documentation reviewed  MDD : manageable continue lamictal, effexor   Mood disorder: fair, gets upset with dealing with mom at time, continue lamictal GAD: manageable continue effexor  Fu 16mRenewed meds   Jessica Seidman  11:32 AM 02/16/2022

## 2022-02-20 DIAGNOSIS — Z01419 Encounter for gynecological examination (general) (routine) without abnormal findings: Secondary | ICD-10-CM | POA: Diagnosis not present

## 2022-02-21 DIAGNOSIS — Z124 Encounter for screening for malignant neoplasm of cervix: Secondary | ICD-10-CM | POA: Diagnosis not present

## 2022-02-27 DIAGNOSIS — N83201 Unspecified ovarian cyst, right side: Secondary | ICD-10-CM | POA: Diagnosis not present

## 2022-02-27 DIAGNOSIS — N9089 Other specified noninflammatory disorders of vulva and perineum: Secondary | ICD-10-CM | POA: Diagnosis not present

## 2022-02-27 DIAGNOSIS — N930 Postcoital and contact bleeding: Secondary | ICD-10-CM | POA: Diagnosis not present

## 2022-02-27 DIAGNOSIS — D259 Leiomyoma of uterus, unspecified: Secondary | ICD-10-CM | POA: Diagnosis not present

## 2022-03-19 ENCOUNTER — Other Ambulatory Visit (HOSPITAL_COMMUNITY): Payer: Self-pay | Admitting: Psychiatry

## 2022-03-19 MED ORDER — VENLAFAXINE HCL ER 75 MG PO CP24: 225.0000 mg | ORAL_CAPSULE | Freq: Every day | ORAL | 2 refills | Status: DC

## 2022-03-19 NOTE — Addendum Note (Signed)
Addended by: Merian Capron on: 03/19/2022 08:51 AM   Modules accepted: Orders

## 2022-04-09 ENCOUNTER — Encounter (HOSPITAL_BASED_OUTPATIENT_CLINIC_OR_DEPARTMENT_OTHER): Payer: Self-pay | Admitting: Obstetrics and Gynecology

## 2022-04-09 NOTE — Progress Notes (Signed)
04/09/2022 12:29 PM Spoke w/ via phone for pre-op interview---patient Lab needs dos----   I Stat Chem 8, EKG Lab results------CBC and BMET from 10/23 in EPIC COVID test -----patient states asymptomatic no test needed Arrive at -------0830 NPO after MN NO Solid Food.  Clear liquids from MN until---0630 Med rec completed Medications to take morning of surgery -----none Diabetic medication -----na Patient instructed no nail polish to be worn day of surgery Patient instructed to bring photo id and insurance card day of surgery Patient aware to have Driver (ride ) / caregiver    for 24 hours after surgery (Partner [name]) Patient Special Instructions -----remove lidocaine patch prior to arrival, HOLD losartan DOS Pre-Op special Istructions -----na Patient verbalized understanding of instructions that were given at this phone interview. Patient denies shortness of breath, chest pain, fever, cough at this phone interview.  Kashana Breach, Arville Lime

## 2022-04-11 NOTE — H&P (Signed)
Brooke Kaiser is an 52 y.o. female presenting for scheduled surgery  Pertinent Gynecological History: Menses: post-menopausal Bleeding: none Contraception:  none - same-sex couple, PMF DES exposure: denies Blood transfusions: none Sexually transmitted diseases: no past history Previous GYN Procedures:  none   Last mammogram: normal Date: 07/2021 Last pap: normal Date: 02/21/22 NIL/neg OB History: G0, P0   Menstrual History: Menarche age: early teens Patient's last menstrual period was 06/24/2013.    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  9-05    Family History  Problem Relation Age of Onset   Cancer Mother 6       Breast   Colon polyps Mother    Other Mother        colon polyps   Dementia Mother    Anxiety disorder Mother    Depression Mother    Paranoid behavior Mother    Depression Father    Suicidality Father        commited suicide   Alcohol abuse Father    Colon polyps Maternal Aunt        cancerous colon polyps   Cancer Maternal Uncle        cancerous colon polyps   Heart disease Other    Other Other        thyroid disorder   Alcohol abuse Paternal Uncle    Dementia Maternal Grandmother    Alcohol abuse Paternal Grandmother    Alcohol abuse Paternal Uncle     Social History:  reports that she has been smoking cigarettes. She has a 37.00 pack-year smoking history. She has never used smokeless tobacco. She reports current alcohol use of about 0.3 standard drinks of alcohol per week. She reports that she does not use drugs.  Allergies:  Allergies  Allergen Reactions   Clavulanic Acid Nausea And Vomiting   Sanctura [Trospium] Other (See Comments)    Dizziness   Tape Hives, Itching and  Rash    Paper tape ok     No medications prior to admission.    Review of Systems  Constitutional:  Negative for chills and fever.  Respiratory:  Negative for shortness of breath.   Cardiovascular:  Negative for chest pain, palpitations and leg swelling.  Gastrointestinal:  Negative for abdominal pain, nausea and vomiting.  Genitourinary:  Positive for pelvic pain and vaginal bleeding.  Neurological:  Negative for dizziness, weakness and headaches.  Psychiatric/Behavioral:  Negative for suicidal ideas.     Last menstrual period 06/24/2013. Physical Exam  Constitutional *General Appearance: obese  Head Head: normocephalic  Neck *ROM normal ROM *Thyroid: no enlargement, non-tender  Lymph Nodes *Palpation: non-tender axillary nodes  Cardiovascular *Auscultation: RRR, no murmur *Peripheral Vascular: no varicosities, no edema  Lungs *Respiratory Effort: no accessory muscle usage, no intercostal retractions *Auscultation: no rhonchi, wheezing expiratory (c/w chronic tobacco use) Inspection: normal respiratory rate  Abdomen *Inspection/Palpation/Auscultation: non-distended, no rebound, no guarding, soft, tenderness (RLQ mild TTP (deep))  Back Appearance normal Palpation no costovertebral angle tenderness  Extremities Legs: normal Arms: normal Pulses: normal  Neurological System Impressions: motor: no deficits, sensory: no deficits  Psychiatric *Mood and Affect: active and alert, normal mood  TVUS: Uterus 7x3x3, EMS 35m with possible 820mnodule vs polyp with vessel. Lf ov 4.1x3.3x2.2cm with 4cm simple cyst. Rt ov not vs; simple anechoic cyst measures 12.4x8x7cm   Assessment/Plan: [age]yoMF (G0, same sex couple) her foir surgical mgmt. Chronic RLQ mass. PMHx s/f Anx/Dep, CHTN, chronic tobacco use (1ppd), HLD, GERD, ANA pos arthritis - 12.4x8x7cm. Benign features, low malignancy concern, minimal growth since 2019. Additionally, post-coital bleeding with TVUS  features c/w polyps today  *Given size, does carry torsion risk.  *Plan for Lap rt ov cystectomy with vulvar lesion removal during same procedure plus hysteroscopy D&C/Myosure sampling Risks of Lap rt ov cystectomy include infection of the uterus, pelvic organs, or skin, inadvertent injury to internal organs, such as bowel or bladder. If there is major injury, extensive surgery may be required. If injury is minor, it may be treated with relative ease. Discussed possibility of excessive blood loss and transfusion. Patient accepts the possibility of blood transfusion, if necessary. Patient understands and agrees to move forward with surgery. -Counseled on hysteroscopy D&C  The patient was informed of the risks and benefits of a hysteroscopy with dilation and curettage. Risks included but were not limited to bleeding, infections, injury to the vulva, vagina or cerivx, or uterine perforation. If concern for latter, may proceed with diagnostic laparoscopy for evaluation of injury which may require surgical repair. Patient understands and is amenable.   [place]/13/2024, 10:34 PM

## 2022-04-12 ENCOUNTER — Encounter (HOSPITAL_BASED_OUTPATIENT_CLINIC_OR_DEPARTMENT_OTHER)
Admission: RE | Disposition: A | Discharge status: Home / Self Care | Payer: Self-pay | Source: Ambulatory Visit | Attending: Obstetrics and Gynecology

## 2022-04-12 ENCOUNTER — Other Ambulatory Visit: Payer: Self-pay

## 2022-04-12 ENCOUNTER — Ambulatory Visit (HOSPITAL_BASED_OUTPATIENT_CLINIC_OR_DEPARTMENT_OTHER): Payer: BC Managed Care – PPO | Admitting: Anesthesiology

## 2022-04-12 ENCOUNTER — Ambulatory Visit (HOSPITAL_BASED_OUTPATIENT_CLINIC_OR_DEPARTMENT_OTHER)
Admission: RE | Admit: 2022-04-12 | Discharge: 2022-04-12 | Disposition: A | Discharge status: Home / Self Care | Payer: BC Managed Care – PPO | Source: Ambulatory Visit | Attending: Obstetrics and Gynecology | Admitting: Obstetrics and Gynecology

## 2022-04-12 ENCOUNTER — Encounter (HOSPITAL_BASED_OUTPATIENT_CLINIC_OR_DEPARTMENT_OTHER): Payer: Self-pay | Admitting: Obstetrics and Gynecology

## 2022-04-12 DIAGNOSIS — N83201 Unspecified ovarian cyst, right side: Secondary | ICD-10-CM | POA: Insufficient documentation

## 2022-04-12 DIAGNOSIS — I10 Essential (primary) hypertension: Secondary | ICD-10-CM | POA: Insufficient documentation

## 2022-04-12 DIAGNOSIS — Z8249 Family history of ischemic heart disease and other diseases of the circulatory system: Secondary | ICD-10-CM | POA: Diagnosis not present

## 2022-04-12 DIAGNOSIS — F17210 Nicotine dependence, cigarettes, uncomplicated: Secondary | ICD-10-CM | POA: Insufficient documentation

## 2022-04-12 DIAGNOSIS — N9489 Other specified conditions associated with female genital organs and menstrual cycle: Secondary | ICD-10-CM | POA: Diagnosis not present

## 2022-04-12 DIAGNOSIS — N840 Polyp of corpus uteri: Secondary | ICD-10-CM | POA: Diagnosis not present

## 2022-04-12 DIAGNOSIS — J45909 Unspecified asthma, uncomplicated: Secondary | ICD-10-CM | POA: Insufficient documentation

## 2022-04-12 DIAGNOSIS — L821 Other seborrheic keratosis: Secondary | ICD-10-CM | POA: Insufficient documentation

## 2022-04-12 DIAGNOSIS — N9089 Other specified noninflammatory disorders of vulva and perineum: Secondary | ICD-10-CM | POA: Diagnosis not present

## 2022-04-12 DIAGNOSIS — A630 Anogenital (venereal) warts: Secondary | ICD-10-CM | POA: Insufficient documentation

## 2022-04-12 DIAGNOSIS — M1990 Unspecified osteoarthritis, unspecified site: Secondary | ICD-10-CM | POA: Diagnosis not present

## 2022-04-12 DIAGNOSIS — N939 Abnormal uterine and vaginal bleeding, unspecified: Secondary | ICD-10-CM | POA: Diagnosis not present

## 2022-04-12 HISTORY — PX: LAPAROSCOPIC OVARIAN CYSTECTOMY: SHX6248

## 2022-04-12 HISTORY — PX: DILATATION & CURETTAGE/HYSTEROSCOPY WITH MYOSURE: SHX6511

## 2022-04-12 HISTORY — DX: Gastro-esophageal reflux disease without esophagitis: K21.9

## 2022-04-12 HISTORY — PX: LESION REMOVAL: SHX5196

## 2022-04-12 HISTORY — DX: Unspecified asthma, uncomplicated: J45.909

## 2022-04-12 LAB — POCT I-STAT, CHEM 8
BUN: 14 mg/dL (ref 6–20)
Calcium, Ion: 1.14 mmol/L — ABNORMAL LOW (ref 1.15–1.40)
Chloride: 105 mmol/L (ref 98–111)
Creatinine, Ser: 0.7 mg/dL (ref 0.44–1.00)
Glucose, Bld: 146 mg/dL — ABNORMAL HIGH (ref 70–99)
HCT: 46 % (ref 36.0–46.0)
Hemoglobin: 15.6 g/dL — ABNORMAL HIGH (ref 12.0–15.0)
Potassium: 4.2 mmol/L (ref 3.5–5.1)
Sodium: 139 mmol/L (ref 135–145)
TCO2: 25 mmol/L (ref 22–32)

## 2022-04-12 LAB — TYPE AND SCREEN
ABO/RH(D): O POS
Antibody Screen: NEGATIVE

## 2022-04-12 LAB — CBC
HCT: 45.4 % (ref 36.0–46.0)
Hemoglobin: 14.9 g/dL (ref 12.0–15.0)
MCH: 29.7 pg (ref 26.0–34.0)
MCHC: 32.8 g/dL (ref 30.0–36.0)
MCV: 90.4 fL (ref 80.0–100.0)
Platelets: 263 10*3/uL (ref 150–400)
RBC: 5.02 MIL/uL (ref 3.87–5.11)
RDW: 13.2 % (ref 11.5–15.5)
WBC: 8.2 10*3/uL (ref 4.0–10.5)
nRBC: 0 % (ref 0.0–0.2)

## 2022-04-12 LAB — ABO/RH: ABO/RH(D): O POS

## 2022-04-12 SURGERY — EXCISION, CYST, OVARY, LAPAROSCOPIC
Anesthesia: General | Site: Vagina | Laterality: Right

## 2022-04-12 MED ORDER — BUPIVACAINE HCL (PF) 0.25 % IJ SOLN
INTRAMUSCULAR | Status: DC | PRN
  Administered 2022-04-12: 13 mL

## 2022-04-12 MED ORDER — SUGAMMADEX SODIUM 200 MG/2ML IV SOLN
INTRAVENOUS | Status: DC | PRN
  Administered 2022-04-12: 200 mg via INTRAVENOUS

## 2022-04-12 MED ORDER — HYDROMORPHONE HCL 2 MG/ML IJ SOLN
INTRAMUSCULAR | Status: AC
  Filled 2022-04-12: qty 1

## 2022-04-12 MED ORDER — PROPOFOL 10 MG/ML IV BOLUS
INTRAVENOUS | Status: AC
  Filled 2022-04-12: qty 20

## 2022-04-12 MED ORDER — FENTANYL CITRATE (PF) 100 MCG/2ML IJ SOLN: 25.0000 ug | INTRAMUSCULAR | Status: DC | PRN

## 2022-04-12 MED ORDER — PHENYLEPHRINE 80 MCG/ML (10ML) SYRINGE FOR IV PUSH (FOR BLOOD PRESSURE SUPPORT)
PREFILLED_SYRINGE | INTRAVENOUS | Status: DC | PRN
  Administered 2022-04-12: 80 ug via INTRAVENOUS

## 2022-04-12 MED ORDER — ROCURONIUM BROMIDE 10 MG/ML (PF) SYRINGE
PREFILLED_SYRINGE | INTRAVENOUS | Status: DC | PRN
  Administered 2022-04-12: 10 mg via INTRAVENOUS
  Administered 2022-04-12: 60 mg via INTRAVENOUS
  Administered 2022-04-12: 10 mg via INTRAVENOUS
  Administered 2022-04-12: 20 mg via INTRAVENOUS
  Administered 2022-04-12: 10 mg via INTRAVENOUS

## 2022-04-12 MED ORDER — KETOROLAC TROMETHAMINE 30 MG/ML IJ SOLN
INTRAMUSCULAR | Status: AC
  Filled 2022-04-12: qty 1

## 2022-04-12 MED ORDER — KETOROLAC TROMETHAMINE 30 MG/ML IJ SOLN
INTRAMUSCULAR | Status: DC | PRN
  Administered 2022-04-12: 15 mg via INTRAVENOUS

## 2022-04-12 MED ORDER — ACETAMINOPHEN 500 MG PO TABS
1000.0000 mg | ORAL_TABLET | ORAL | Status: AC
  Administered 2022-04-12: 1000 mg via ORAL

## 2022-04-12 MED ORDER — MIDAZOLAM HCL 2 MG/2ML IJ SOLN
INTRAMUSCULAR | Status: AC
  Filled 2022-04-12: qty 2

## 2022-04-12 MED ORDER — ACETAMINOPHEN 500 MG PO TABS
ORAL_TABLET | ORAL | Status: AC
  Filled 2022-04-12: qty 2

## 2022-04-12 MED ORDER — SODIUM CHLORIDE 0.9 % IR SOLN
Status: DC | PRN
  Administered 2022-04-12: 1500 mL

## 2022-04-12 MED ORDER — FENTANYL CITRATE (PF) 250 MCG/5ML IJ SOLN
INTRAMUSCULAR | Status: AC
  Filled 2022-04-12: qty 5

## 2022-04-12 MED ORDER — ONDANSETRON HCL 4 MG/2ML IJ SOLN
INTRAMUSCULAR | Status: AC
  Filled 2022-04-12: qty 2

## 2022-04-12 MED ORDER — WHITE PETROLATUM EX OINT
TOPICAL_OINTMENT | CUTANEOUS | Status: AC
  Filled 2022-04-12: qty 5

## 2022-04-12 MED ORDER — KETAMINE HCL 10 MG/ML IJ SOLN
INTRAMUSCULAR | Status: DC | PRN
  Administered 2022-04-12: 30 mg via INTRAVENOUS

## 2022-04-12 MED ORDER — ROCURONIUM BROMIDE 10 MG/ML (PF) SYRINGE
PREFILLED_SYRINGE | INTRAVENOUS | Status: AC
  Filled 2022-04-12: qty 10

## 2022-04-12 MED ORDER — LACTATED RINGERS IV SOLN: INTRAVENOUS | Status: DC

## 2022-04-12 MED ORDER — LIDOCAINE 2% (20 MG/ML) 5 ML SYRINGE
INTRAMUSCULAR | Status: DC | PRN
  Administered 2022-04-12: 100 mg via INTRAVENOUS

## 2022-04-12 MED ORDER — LIDOCAINE HCL (PF) 2 % IJ SOLN
INTRAMUSCULAR | Status: AC
  Filled 2022-04-12: qty 5

## 2022-04-12 MED ORDER — POVIDONE-IODINE 10 % EX SWAB: 2.0000 | Freq: Once | CUTANEOUS | Status: DC

## 2022-04-12 MED ORDER — ONDANSETRON HCL 4 MG/2ML IJ SOLN
INTRAMUSCULAR | Status: DC | PRN
  Administered 2022-04-12: 4 mg via INTRAVENOUS

## 2022-04-12 MED ORDER — DEXAMETHASONE SODIUM PHOSPHATE 10 MG/ML IJ SOLN
INTRAMUSCULAR | Status: DC | PRN
  Administered 2022-04-12: 5 mg via INTRAVENOUS

## 2022-04-12 MED ORDER — FENTANYL CITRATE (PF) 100 MCG/2ML IJ SOLN
INTRAMUSCULAR | Status: AC
  Filled 2022-04-12: qty 2

## 2022-04-12 MED ORDER — OXYCODONE HCL 5 MG PO TABS: 5.0000 mg | ORAL_TABLET | Freq: Four times a day (QID) | ORAL | 0 refills | Status: DC | PRN

## 2022-04-12 MED ORDER — SODIUM CHLORIDE 0.9 % IR SOLN
Status: DC | PRN
  Administered 2022-04-12: 200 mL

## 2022-04-12 MED ORDER — PHENYLEPHRINE 80 MCG/ML (10ML) SYRINGE FOR IV PUSH (FOR BLOOD PRESSURE SUPPORT)
PREFILLED_SYRINGE | INTRAVENOUS | Status: AC
  Filled 2022-04-12: qty 10

## 2022-04-12 MED ORDER — FENTANYL CITRATE (PF) 100 MCG/2ML IJ SOLN
INTRAMUSCULAR | Status: DC | PRN
  Administered 2022-04-12: 100 ug via INTRAVENOUS
  Administered 2022-04-12 (×5): 50 ug via INTRAVENOUS

## 2022-04-12 MED ORDER — 0.9 % SODIUM CHLORIDE (POUR BTL) OPTIME
TOPICAL | Status: DC | PRN
  Administered 2022-04-12: 1000 mL

## 2022-04-12 MED ORDER — ACETAMINOPHEN 500 MG PO TABS: 1000.0000 mg | ORAL_TABLET | Freq: Once | ORAL | Status: DC

## 2022-04-12 MED ORDER — ALBUTEROL SULFATE HFA 108 (90 BASE) MCG/ACT IN AERS
INHALATION_SPRAY | RESPIRATORY_TRACT | Status: DC | PRN
  Administered 2022-04-12 (×2): 8 via RESPIRATORY_TRACT

## 2022-04-12 MED ORDER — PROPOFOL 10 MG/ML IV BOLUS
INTRAVENOUS | Status: DC | PRN
  Administered 2022-04-12: 20 mg via INTRAVENOUS
  Administered 2022-04-12: 50 mg via INTRAVENOUS
  Administered 2022-04-12: 200 mg via INTRAVENOUS
  Administered 2022-04-12: 20 mg via INTRAVENOUS

## 2022-04-12 MED ORDER — MIDAZOLAM HCL 5 MG/5ML IJ SOLN
INTRAMUSCULAR | Status: DC | PRN
  Administered 2022-04-12: 2 mg via INTRAVENOUS

## 2022-04-12 MED ORDER — LIDOCAINE HCL 1 % IJ SOLN
INTRAMUSCULAR | Status: DC | PRN
  Administered 2022-04-12: 10 mL

## 2022-04-12 MED ORDER — HYDROMORPHONE HCL 1 MG/ML IJ SOLN
INTRAMUSCULAR | Status: DC | PRN
  Administered 2022-04-12 (×2): .5 mg via INTRAVENOUS

## 2022-04-12 MED ORDER — IBUPROFEN 800 MG PO TABS: 800.0000 mg | ORAL_TABLET | Freq: Three times a day (TID) | ORAL | 0 refills | Status: DC | PRN

## 2022-04-12 MED ORDER — DEXAMETHASONE SODIUM PHOSPHATE 10 MG/ML IJ SOLN
INTRAMUSCULAR | Status: AC
  Filled 2022-04-12: qty 1

## 2022-04-12 SURGICAL SUPPLY — 69 items
ADH SKN CLS APL DERMABOND .7 (GAUZE/BANDAGES/DRESSINGS) ×4
BAG LAPAROSCOPIC 12 15 PORT 16 (BASKET) IMPLANT
BAG RETRIEVAL 12/15 (BASKET) ×2
BLADE SURG 15 STRL LF DISP TIS (BLADE) IMPLANT
BLADE SURG 15 STRL SS (BLADE) ×2
CATH ROBINSON RED A/P 16FR (CATHETERS) ×2 IMPLANT
CNTNR URN SCR LID CUP LEK RST (MISCELLANEOUS) IMPLANT
CONT SPEC 4OZ STRL OR WHT (MISCELLANEOUS) ×2
DERMABOND ADVANCED .7 DNX12 (GAUZE/BANDAGES/DRESSINGS) ×2 IMPLANT
DEVICE MYOSURE LITE (MISCELLANEOUS) IMPLANT
DEVICE MYOSURE REACH (MISCELLANEOUS) IMPLANT
DRAPE SHEET LG 3/4 BI-LAMINATE (DRAPES) IMPLANT
DRAPE SURG IRRIG POUCH 19X23 (DRAPES) ×2 IMPLANT
DRSG OPSITE POSTOP 3X4 (GAUZE/BANDAGES/DRESSINGS) IMPLANT
DRSG TELFA 3X8 NADH STRL (GAUZE/BANDAGES/DRESSINGS) ×2 IMPLANT
DURAPREP 26ML APPLICATOR (WOUND CARE) ×2 IMPLANT
GAUZE 4X4 16PLY ~~LOC~~+RFID DBL (SPONGE) ×2 IMPLANT
GLOVE BIOGEL PI IND STRL 6.5 (GLOVE) ×4 IMPLANT
GLOVE BIOGEL PI IND STRL 7.0 (GLOVE) ×2 IMPLANT
GLOVE ECLIPSE 6.5 STRL STRAW (GLOVE) ×2 IMPLANT
GOWN STRL REUS W/TWL LRG LVL3 (GOWN DISPOSABLE) ×4 IMPLANT
IRRIG SUCT STRYKERFLOW 2 WTIP (MISCELLANEOUS) ×2
IRRIGATION SUCT STRKRFLW 2 WTP (MISCELLANEOUS) IMPLANT
IV NS 1000ML (IV SOLUTION) ×2
IV NS 1000ML BAXH (IV SOLUTION) IMPLANT
IV NS IRRIG 3000ML ARTHROMATIC (IV SOLUTION) IMPLANT
KIT PINK PAD W/HEAD ARE REST (MISCELLANEOUS) ×2
KIT PINK PAD W/HEAD ARM REST (MISCELLANEOUS) ×2 IMPLANT
KIT PROCEDURE FLUENT (KITS) ×2 IMPLANT
KIT TURNOVER CYSTO (KITS) ×2 IMPLANT
NDL HYPO 22X1.5 SAFETY MO (MISCELLANEOUS) IMPLANT
NDL INSUFFLATION 14GA 120MM (NEEDLE) ×2 IMPLANT
NEEDLE HYPO 22X1.5 SAFETY MO (MISCELLANEOUS) ×2 IMPLANT
NEEDLE INSUFFLATION 14GA 120MM (NEEDLE) ×2 IMPLANT
NEEDLE SAFETY HYPO 22GAX1.5 (MISCELLANEOUS) ×2
NS IRRIG 1000ML POUR BTL (IV SOLUTION) ×2 IMPLANT
PACK LAPAROSCOPY BASIN (CUSTOM PROCEDURE TRAY) ×2 IMPLANT
PACK VAGINAL MINOR WOMEN LF (CUSTOM PROCEDURE TRAY) ×2 IMPLANT
PAD OB MATERNITY 4.3X12.25 (PERSONAL CARE ITEMS) ×2 IMPLANT
PENCIL SMOKE EVACUATOR (MISCELLANEOUS) IMPLANT
POUCH LAPAROSCOPIC INSTRUMENT (MISCELLANEOUS) IMPLANT
PROTECTOR NERVE ULNAR (MISCELLANEOUS) ×4 IMPLANT
SCISSORS LAP 5X35 DISP (ENDOMECHANICALS) IMPLANT
SEAL ROD LENS SCOPE MYOSURE (ABLATOR) ×2 IMPLANT
SET SUCTION IRRIG HYDROSURG (IRRIGATION / IRRIGATOR) IMPLANT
SET TRI-LUMEN FLTR TB AIRSEAL (TUBING) IMPLANT
SET TUBE SMOKE EVAC HIGH FLOW (TUBING) ×2 IMPLANT
SHEARS HARMONIC ACE PLUS 36CM (ENDOMECHANICALS) IMPLANT
SLEEVE SCD COMPRESS KNEE MED (STOCKING) ×2 IMPLANT
SUT MNCRL AB 3-0 PS2 18 (SUTURE) IMPLANT
SUT VIC AB 3-0 PS2 18 (SUTURE) ×2
SUT VIC AB 3-0 PS2 18XBRD (SUTURE) ×2 IMPLANT
SUT VIC AB 3-0 SH 27 (SUTURE) ×2
SUT VIC AB 3-0 SH 27X BRD (SUTURE) IMPLANT
SUT VICRYL 0 UR6 27IN ABS (SUTURE) ×2 IMPLANT
SYR 50ML LL SCALE MARK (SYRINGE) IMPLANT
SYS BAG RETRIEVAL 10MM (BASKET)
SYS RETRIEVAL 5MM INZII UNIV (BASKET)
SYSTEM BAG RETRIEVAL 10MM (BASKET) IMPLANT
SYSTEM CARTER THOMASON II (TROCAR) IMPLANT
SYSTEM RETRIEVL 5MM INZII UNIV (BASKET) IMPLANT
TOWEL OR 17X24 6PK STRL BLUE (TOWEL DISPOSABLE) ×2 IMPLANT
TRAY FOLEY W/BAG SLVR 14FR LF (SET/KITS/TRAYS/PACK) ×2 IMPLANT
TROCAR PORT AIRSEAL 5X120 (TROCAR) IMPLANT
TROCAR Z-THREAD FIOS 11X100 BL (TROCAR) IMPLANT
TROCAR Z-THREAD FIOS 5X100MM (TROCAR) ×4 IMPLANT
TUBE CONNECTING 12X1/4 (SUCTIONS) IMPLANT
WARMER LAPAROSCOPE (MISCELLANEOUS) ×2 IMPLANT
YANKAUER SUCT BULB TIP NO VENT (SUCTIONS) IMPLANT

## 2022-04-12 NOTE — Op Note (Signed)
04/12/22  Preoperative indication: Symptomatic right adnexal mass, abnormal uterine bleeding, symptomatic vulvar lesions Postoperative indications: Symptomatic right adnexal cyst, endometrial polyps, vulvar conyloma Procedures: 1) Laparoscopic RSO 2) Hysteroscopic Myosure resection 3) Removal of vulvar lesions x4 Surgeon: Eula Flax, MD Assist: Armandina Gemma, MD Anesthesia: GETA by Dr Lanetta Inch IVF: 2L UOP 200cc EBL: 150cc Findings: external genitalia significant for multiple vulvar lesions, verrucous in nature, on perineum and BL labia. Cervix WNL, nulliparous. BME reveals fullness in right adnexa consistent with prior imaging. Left adnexa WNL. Upon insertion of laparoscope, smooth cystic, glistening cyst noted filling pelvis. This was contiguous with small, atrophic appearing right ovary, Once specimen remove,d rest of uterus and left adnexa WNL. Hysteroscopic views shows 3-4 polypoid projections (pale pink in nature) with rest of endometrium smooth appearing. Vulvar lesions as noted in op note.   Operative procedure: The patient was taken to the operating room where a time out was performed to confirm patient and correct procedure. The patient was given no antibiotics in accordance with ACOG guidelines based on weight. General anesthesia was established. The patient was then positioned on the operating table in the dorsal lithotomy position with the legs supported using stirrups. All pressure points were padded and a Bair hugger was placed to maintain control of core body temperature.    The patient was then prepped and draped in the usual sterile fashion. A Foley catheter was inserted in normal sterile fashion. An operative speculum was placed into the vagina. A sponge stick was placed in the vagina. The uterus sounded to 8cm prior to this.  Attention turned to abdomen at this time. 0.25% marcaine plain was injected subdermally infraumbilically. A 76m vertical incision was made  infraumbilically. A 579mtrochar and sleeve were inserted through the incision using direct visualization with laparoscope. Once intraabdominal entry was confirmed. Pneumoperitoneum was established. Large pelvic cyst viewed as in findings. Unable to place patient in full trendelenburg given chronic smoking history and anesthesia concerns. This affected visibility and added time to procedure (dilated bowel with significant epiploica noted).  Subsequently, two other small incision were made in left and right lower quadrants, both 2cm above and 2cm medial to corresponding ASIS's. LLQ site 1080mRLQ site 5mm69mmm 74mt introduced in RLQ, 10mm 39mhar introduced in LLQ. Laparoscopic syringe used to drain, carefully, 360cc clear serous appearing fluid form cyst which was sent to pathology. Harmonic scalpel was then inserted to LLQ trochar and small hole was made in cyst wall, allowing for suction to be placed inside for quicker specimen deflation. A 10mm E63match bag was used to retreive specimen (Large cystic structure with attached right ovary) but bag found not to be large enough, therefore 15mm En48mtch bag placed through new 12mm tro41m. Unable to comfortable pass bag through fascial defect in order to retrieve specimen. General surgery called in order to assist in retrieval While waiting, attention turned to perineum. Operative scope placed and tenaculum then placed on posterior cervical lip. Pratt dilators used (19) to dilate internal os. Gentle traction used to dilate internal os. Upon insertion of Myosure hysteroscope, findings noted as above. Myosure resection carried out until visible pahtology was removed. tenaculum removed. Bleeding controlled with pressure. All instruments removed from vagina.   Fluid deficit controlled throughout, final deficit 160cc.  At this time, general surgery presented to OR. Attention turned back to abdomen. Ring forceps and surgeon hands used to extend fascial incision in  order to deliver cyst specimen intact through skin incision with no defect noted in  Endocatch bag. Passed off to pathology. RLQ 85m port replaced with Airseal port in order to facilitate LLQ closure. Once this was completed, Carter-Thompson closure device used to carefully place suture in one figure of eight suture through fascia. At this time, laparoscope removed and insufflation turned off with trochars removed under direct visualization. Using 0-vicryl on UR-6, subcutaneous stiches placed in LLQ incision. Skin closed on 3-0 vicyrl on Sh needle in subcuticular fashion in all three incision.   Attention then turned to perineum. Two lesions removed with a crush-cautery fashion in upper bilateral labia, both approx 0.7cm verrucous lesions. Beds hemostatic with Bovie. Third lesion approx 1cm in right inguinal fold, slightly hyperpigmented, removed with sharp dissection, rendered hemostatic with Bovie and 3-0 vicryl in figure of 8 and subcuticular stitches. Finally, fourth lesions at perineum (1.5 cm, hyperpigmented, verrucous) removed in sharp fashion with 3-0 vicryl for closure. Excellent hemostasis noted, Dermabond used overlying suture beds.   The patient was transferred to recovery room in stable condition. All needle, sponge ad instrument counts were noted to be correct. x2 at end of procedure.     Please note- delayed start 2/2 prior case. Extra operating time required due to patient habitus and chronic tobacco use in order to safely visualize specimens.

## 2022-04-12 NOTE — Anesthesia Procedure Notes (Signed)
Procedure Name: Intubation Date/Time: 04/12/2022 11:46 AM  Performed by: Bonney Aid, CRNAPre-anesthesia Checklist: Patient identified, Emergency Drugs available, Suction available and Patient being monitored Patient Re-evaluated:Patient Re-evaluated prior to induction Oxygen Delivery Method: Circle system utilized Preoxygenation: Pre-oxygenation with 100% oxygen Induction Type: IV induction Ventilation: Mask ventilation without difficulty Laryngoscope Size: Mac and 3 Grade View: Grade I Tube type: Oral Tube size: 7.0 mm Number of attempts: 1 Airway Equipment and Method: Stylet Placement Confirmation: ETT inserted through vocal cords under direct vision, positive ETCO2 and breath sounds checked- equal and bilateral Secured at: 22 cm Tube secured with: Tape Dental Injury: Teeth and Oropharynx as per pre-operative assessment  Comments: Initial intubation attempts (x2) by Severiano Gilbert with no trauma to pt. Gr 1 view DL by CRNA + etCO2.

## 2022-04-12 NOTE — Anesthesia Preprocedure Evaluation (Addendum)
Anesthesia Evaluation  Patient identified by MRN, date of birth, ID band Patient awake    Reviewed: Allergy & Precautions, NPO status , Patient's Chart, lab work & pertinent test results  Airway Mallampati: II  TM Distance: >3 FB Neck ROM: Full    Dental  (+) Edentulous Upper, Dental Advisory Given   Pulmonary asthma , Current Smoker and Patient abstained from smoking.   Pulmonary exam normal breath sounds clear to auscultation       Cardiovascular hypertension, Pt. on medications Normal cardiovascular exam Rhythm:Regular Rate:Normal     Neuro/Psych  PSYCHIATRIC DISORDERS Anxiety Depression    negative neurological ROS     GI/Hepatic Neg liver ROS,GERD  ,,  Endo/Other  negative endocrine ROS    Renal/GU negative Renal ROS  negative genitourinary   Musculoskeletal  (+) Arthritis ,    Abdominal   Peds  Hematology negative hematology ROS (+)   Anesthesia Other Findings   Reproductive/Obstetrics                             Anesthesia Physical Anesthesia Plan  ASA: 2  Anesthesia Plan: General   Post-op Pain Management: Tylenol PO (pre-op)*   Induction: Intravenous  PONV Risk Score and Plan: 2 and Midazolam, Dexamethasone and Ondansetron  Airway Management Planned: Oral ETT  Additional Equipment:   Intra-op Plan:   Post-operative Plan: Extubation in OR  Informed Consent: I have reviewed the patients History and Physical, chart, labs and discussed the procedure including the risks, benefits and alternatives for the proposed anesthesia with the patient or authorized representative who has indicated his/her understanding and acceptance.     Dental advisory given  Plan Discussed with: CRNA  Anesthesia Plan Comments:        Anesthesia Quick Evaluation

## 2022-04-12 NOTE — Transfer of Care (Signed)
Immediate Anesthesia Transfer of Care Note  Patient: Brooke Kaiser  Procedure(s) Performed: LAPAROSCOPIC OVARIAN CYSTECTOMY; RIGHT OOPHORECTOMY (Right: Abdomen) DILATATION & CURETTAGE/HYSTEROSCOPY WITH MYOSURE (Vagina ) EXCISION VAGINAL LESION (Vagina )  Patient Location: PACU  Anesthesia Type:General  Level of Consciousness: awake, alert , oriented, and patient cooperative  Airway & Oxygen Therapy: Patient Spontanous Breathing and Patient connected to face mask oxygen  Post-op Assessment: Report given to RN and Post -op Vital signs reviewed and stable  Post vital signs: Reviewed and stable  Last Vitals:  Vitals Value Taken Time  BP 191/104 04/12/22 1456  Temp 36.8 C 04/12/22 1452  Pulse 84 04/12/22 1459  Resp 16 04/12/22 1459  SpO2 97 % 04/12/22 1459  Vitals shown include unvalidated device data.  Last Pain:  Vitals:   04/12/22 0909  TempSrc: Oral         Complications: No notable events documented.

## 2022-04-12 NOTE — Brief Op Note (Signed)
04/12/2022  2:34 PM  PATIENT:  Darliss Cheney Cavell  52 y.o. female  PRE-OPERATIVE DIAGNOSIS:  cyst of right ovary, abnormal uterine bleeding, vulvar lesions  POST-OPERATIVE DIAGNOSIS:  cyst of right ovary, endometrial polyps, vulvar condyloma  PROCEDURE:  Procedure(s) with comments: LAPAROSCOPIC OVARIAN CYSTECTOMY; RIGHT OOPHORECTOMY (Right) - DILATATION & CURETTAGE/HYSTEROSCOPY WITH MYOSURE (N/A) EXCISION VAGINAL LESION (N/A)  SURGEON:  Surgeon(s) and Role:    * Undrea Archbold, Melida Quitter, MD - Primary   ANESTHESIA:   general  EBL:  150 mL   BLOOD ADMINISTERED:none  DRAINS: none   LOCAL MEDICATIONS USED:  MARCAINE    and LIDOCAINE   SPECIMEN:  Source of Specimen:  right adnexal cyst and right ovary, endometrial curettings, vulvar biopsies x4  DISPOSITION OF SPECIMEN:  PATHOLOGY  COUNTS:  YES  PLAN OF CARE: Discharge to home after PACU  PATIENT DISPOSITION:  PACU - hemodynamically stable.   Delay start of Pharmacological VTE agent (>24hrs) due to surgical blood loss or risk of bleeding: yes

## 2022-04-12 NOTE — Interval H&P Note (Signed)
History and Physical Interval Note:  04/12/2022 10:44 AM  Brooke Kaiser  has presented today for surgery, with the diagnosis of cyst of right ovary.  The various methods of treatment have been discussed with the patient and family. After consideration of risks, benefits and other options for treatment, the patient has consented to  Procedure(s) with comments: LAPAROSCOPIC OVARIAN CYSTECTOMY (Right) - 1.5hrs in OR total per MD Soham (N/A) EXCISION VAGINAL LESION (N/A) as a surgical intervention.  The patient's history has been reviewed, patient examined, no change in status, stable for surgery.  I have reviewed the patient's chart and labs.  Questions were answered to the patient's satisfaction.     Show Low

## 2022-04-12 NOTE — Discharge Instructions (Addendum)
No ibuprofen, Advil, Aleve, Motrin, ketorolac, meloxicam, naproxen, or other NSAIDS until after 8:00pm today if needed for pain.    DISCHARGE INSTRUCTIONS: Laparoscopy  The following instructions have been prepared to help you care for yourself upon your return home today.  Wound care:  Do not get the incision wet for the first 24 hours. The incision should be kept clean and dry.  The Band-Aids or dressings may be removed the day after surgery.  Should the incision become sore, red, and swollen after the first week, check with your doctor.  Personal hygiene:  Shower the day after your procedure.  Activity and limitations:  Do NOT drive or operate any equipment today.  Do NOT lift anything more than 15 pounds for 2-3 weeks after surgery.  Do NOT rest in bed all day.  Walking is encouraged. Walk each day, starting slowly with 5-minute walks 3 or 4 times a day. Slowly increase the length of your walks.  Walk up and down stairs slowly.  Do NOT do strenuous activities, such as golfing, playing tennis, bowling, running, biking, weight lifting, gardening, mowing, or vacuuming for 2-4 weeks. Ask your doctor when it is okay to start.  Diet: Eat a light meal as desired this evening. You may resume your usual diet tomorrow.  Return to work: This is dependent on the type of work you do. For the most part you can return to a desk job within a week of surgery. If you are more active at work, please discuss this with your doctor.  What to expect after your surgery: You may have a slight burning sensation when you urinate on the first day. You may have a very small amount of blood in the urine. Expect to have a small amount of vaginal discharge/light bleeding for 1-2 weeks. It is not unusual to have abdominal soreness and bruising for up to 2 weeks. You may be tired and need more rest for about 1 week. You may experience shoulder pain for 24-72 hours. Lying flat in bed may relieve it.  Call your doctor  for any of the following:  Develop a fever of 100.4 or greater  Inability to urinate 6 hours after discharge from hospital  Severe pain not relieved by pain medications  Persistent of heavy bleeding at incision site  Redness or swelling around incision site after a week  Increasing nausea or vomiting    DISCHARGE INSTRUCTIONS: HYSTEROSCOPY The following instructions have been prepared to help you care for yourself upon your return home.    Personal hygiene:  Use sanitary pads for vaginal drainage, not tampons.  Shower the day after your procedure.  NO tub baths, pools or Jacuzzis for 2-3 weeks.  Wipe front to back after using the bathroom.  Activity and limitations:  Do NOT drive or operate any equipment for 24 hours. The effects of anesthesia are still present and drowsiness may result.  Do NOT rest in bed all day.  Walking is encouraged.  Walk up and down stairs slowly.  You may resume your normal activity in one to two days or as indicated by your physician. Sexual activity: NO intercourse for at least 2 weeks after the procedure, or as indicated by your Doctor.  Diet: Eat a light meal as desired this evening. You may resume your usual diet tomorrow.  Return to Work: You may resume your work activities in one to two days or as indicated by Marine scientist.  What to expect after your surgery: Expect  to have vaginal bleeding/discharge for 2-3 days and spotting for up to 10 days. It is not unusual to have soreness for up to 1-2 weeks. You may have a slight burning sensation when you urinate for the first day. Mild cramps may continue for a couple of days. You may have a regular period in 2-6 weeks.  Call your doctor for any of the following:  Excessive vaginal bleeding or clotting, saturating and changing one pad every hour.  Inability to urinate 6 hours after discharge from hospital.  Pain not relieved by pain medication.  Fever of 100.4 F or greater.  Unusual vaginal  discharge or odor.     Post Anesthesia Home Care Instructions  Activity: Get plenty of rest for the remainder of the day. A responsible individual must stay with you for 24 hours following the procedure.  For the next 24 hours, DO NOT: -Drive a car -Paediatric nurse -Drink alcoholic beverages -Take any medication unless instructed by your physician -Make any legal decisions or sign important papers.  Meals: Start with liquid foods such as gelatin or soup. Progress to regular foods as tolerated. Avoid greasy, spicy, heavy foods. If nausea and/or vomiting occur, drink only clear liquids until the nausea and/or vomiting subsides. Call your physician if vomiting continues.  Special Instructions/Symptoms: Your throat may feel dry or sore from the anesthesia or the breathing tube placed in your throat during surgery. If this causes discomfort, gargle with warm salt water. The discomfort should disappear within 24 hours.

## 2022-04-13 ENCOUNTER — Encounter (HOSPITAL_BASED_OUTPATIENT_CLINIC_OR_DEPARTMENT_OTHER): Payer: Self-pay | Admitting: Obstetrics and Gynecology

## 2022-04-13 LAB — CYTOLOGY - NON PAP

## 2022-04-13 LAB — SURGICAL PATHOLOGY

## 2022-04-13 NOTE — Anesthesia Postprocedure Evaluation (Signed)
Anesthesia Post Note  Patient: Brooke Kaiser  Procedure(s) Performed: LAPAROSCOPIC OVARIAN CYSTECTOMY; RIGHT OOPHORECTOMY (Right: Abdomen) DILATATION & CURETTAGE/HYSTEROSCOPY WITH MYOSURE (Vagina ) EXCISION VAGINAL LESION (Vagina )     Patient location during evaluation: PACU Anesthesia Type: General Level of consciousness: awake and alert Pain management: pain level controlled Vital Signs Assessment: post-procedure vital signs reviewed and stable Respiratory status: spontaneous breathing, nonlabored ventilation, respiratory function stable and patient connected to nasal cannula oxygen Cardiovascular status: blood pressure returned to baseline and stable Postop Assessment: no apparent nausea or vomiting Anesthetic complications: no  No notable events documented.  Last Vitals:  Vitals:   04/12/22 1530 04/12/22 1608  BP: (!) 140/76 (!) 147/77  Pulse: 84 74  Resp: 13 12  Temp: 36.7 C 36.5 C  SpO2: 92% 92%    Last Pain:  Vitals:   04/12/22 1600  TempSrc:   PainSc: 0-No pain                 Joyia Riehle L Skai Lickteig

## 2022-04-19 ENCOUNTER — Encounter: Payer: Self-pay | Admitting: Family Medicine

## 2022-04-24 ENCOUNTER — Telehealth: Payer: Self-pay

## 2022-04-24 NOTE — Telephone Encounter (Addendum)
Disability Form  Attending Physician's Statement of Disability  To: [employer] Fax: [phone]  Due: 05/05/22

## 2022-05-07 NOTE — Telephone Encounter (Signed)
Form completed and faxed back 05/04/22.

## 2022-05-08 NOTE — Telephone Encounter (Signed)
Received request for additional information.   Given recent surgery, pt will need to be seen for re-eval of sx so that disability forms can be completed.   Called pt and left VM to call the office. Please assist pt with scheduling when call is returned.

## 2022-05-15 ENCOUNTER — Other Ambulatory Visit (HOSPITAL_COMMUNITY): Payer: Self-pay | Admitting: Psychiatry

## 2022-05-22 ENCOUNTER — Encounter: Payer: Self-pay | Admitting: Family Medicine

## 2022-05-22 ENCOUNTER — Ambulatory Visit (INDEPENDENT_AMBULATORY_CARE_PROVIDER_SITE_OTHER): Payer: BC Managed Care – PPO | Admitting: Family Medicine

## 2022-05-22 VITALS — BP 122/80 | HR 76 | Ht 68.0 in | Wt 258.0 lb

## 2022-05-22 DIAGNOSIS — R202 Paresthesia of skin: Secondary | ICD-10-CM | POA: Diagnosis not present

## 2022-05-22 DIAGNOSIS — M47816 Spondylosis without myelopathy or radiculopathy, lumbar region: Secondary | ICD-10-CM | POA: Diagnosis not present

## 2022-05-22 DIAGNOSIS — G8929 Other chronic pain: Secondary | ICD-10-CM

## 2022-05-22 DIAGNOSIS — M5442 Lumbago with sciatica, left side: Secondary | ICD-10-CM

## 2022-05-22 DIAGNOSIS — M5441 Lumbago with sciatica, right side: Secondary | ICD-10-CM | POA: Diagnosis not present

## 2022-05-22 NOTE — Patient Instructions (Signed)
Thank you for coming in today.   I've referred you to Physical Therapy.  Let us know if you don't hear from them in one week.   Recheck in 2 months.   Let me know if you want to do injections before recheck.   I recommend PT first.

## 2022-05-22 NOTE — Progress Notes (Signed)
Rubin Payor, PhD, LAT, ATC acting as a scribe for [name], MD.  Brooke Kaiser is a 52 y.o. female who presents to [facility] today for exacerbation of her LBP. Pt was last seen by Dr. Denyse Amass on 01/23/22 and an ESI was order, and later performed on 02/05/22. Today, pt reports numbness bilat LE, L>R. Re-eval for FMLA/ADA forms s/p GYN surgery.   Her numbness is primarily located in the left anterior thigh.  It is not associated with weakness.  Additionally she notes chronic axial back pain bilateral lower back.  This is worse with activity.  She had a large ovarian cyst removed left side about 5 weeks ago.  Dx imaging: 01/21/22 L-spine MRI             06/27/21 L-spine XR  Pertinent review of systems: No fevers or chills  Relevant historical information: Hypertension   Exam:  BP 122/80   Pulse 76   Ht  (1.727 m)   Wt 258 lb (117 kg)   LMP 06/24/2013   SpO2 96%   BMI 39.23 kg/m  General: Well Developed, well nourished, and in no acute distress.   MSK: L-spine normal. Nontender palpation lumbar midline.  Tender palpation bilateral lumbar paraspinal musculature. Decreased lumbar motion. Lower extremity strength is intact.    Lab and Radiology Results  EXAM: MRI LUMBAR SPINE WITHOUT CONTRAST   TECHNIQUE: Multiplanar, multisequence MR imaging of the lumbar spine was performed. No intravenous contrast was administered.   COMPARISON:  None Available.   FINDINGS: Segmentation:  Standard.   Alignment:  Mild levocurvature   Vertebrae:  Borderline marrow edema at the right L4-5 facet   Conus medullaris and cauda equina: Conus extends to the L1-2 level. Conus and cauda equina appear normal.   Paraspinal and other soft tissues: Minimally covered cystic structure that is right eccentric on the lowest axial slices, correlating with 07/03/2021 pelvic ultrasound   Disc levels:   T12- L1: Mild ventral spondylitic spurring    L1-L2: Unremarkable.   L2-L3: Slight disc desiccation with leftward bulging.   L3-L4: Minor disc bulging with left inferior foraminal protrusion. No neural compression   L4-L5: Disc narrowing with bulge and right paracentral to foraminal herniation. Right subarticular recess narrowing that could affect the right L5 nerve root. Degenerative facet spurring on both sides. Patent foramina with mild stenosis on the right   L5-S1:Disc narrowing and bulging eccentric to the left where there is greater endplate spurring. Degenerative facet spurring on both sides. Moderate left foraminal stenosis.   Due to incidental finding with no visible follow-up in the chart, these results will be called to the ordering clinician or representative by the Radiologist Assistant, and communication documented in the PACS or [employer].   IMPRESSION: 1. Minimally covered large cyst in the right pelvis, correlating with adnexal cyst described on ultrasound 07/03/2021, please ensure there has been gynecology follow-up. 2. Lumbar spine degeneration with facet spurring and small herniations described above. Moderate stenosis at the right L4-5 subarticular recess and left L5-S1 foramen.     Electronically Signed   By: Tiburcio Pea M.D.   On: 01/24/2022 10:16   I, [name], personally (independently) visualized and performed the interpretation of the images attached in this note.     Assessment and Plan: 52 y.o. female with chronic low back pain with left anterior thigh paresthesias.  Chronic low back pain is likely due to muscular dysfunction and core weakness associated  with facet arthritis.  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  Plan for trial of physical therapy and if not better consider facet injection.  The thigh numbness and paresthesia does not correspond to her MRI results and could be due to femoral nerve irritation from her adnexal  cyst.  This could improve with time following the cyst removal about a month ago.  If not better consider trial of diagnostic and possibly therapeutic lateral femoral cutaneous nerve injection.  Recheck in 2 months.   PDMP not reviewed this encounter. Orders Placed This Encounter  Procedures   Ambulatory referral to Physical Therapy    Referral Priority:   Routine    Referral Type:   Physical Medicine    Referral Reason:   Specialty Services Required    Requested Specialty:   Physical Therapy    Number of Visits Requested:   1   No orders of the defined types were placed in this encounter.    Discussed warning signs or symptoms. Please see discharge instructions. Patient expresses understanding.   The above documentation has been reviewed and is accurate and complete [name], M.D.

## 2022-05-28 ENCOUNTER — Encounter: Payer: Self-pay | Admitting: Family Medicine

## 2022-05-28 ENCOUNTER — Ambulatory Visit (INDEPENDENT_AMBULATORY_CARE_PROVIDER_SITE_OTHER): Payer: BC Managed Care – PPO | Admitting: Family Medicine

## 2022-05-28 VITALS — BP 134/62 | HR 83 | Ht 68.0 in | Wt 254.0 lb

## 2022-05-28 DIAGNOSIS — E785 Hyperlipidemia, unspecified: Secondary | ICD-10-CM

## 2022-05-28 DIAGNOSIS — D582 Other hemoglobinopathies: Secondary | ICD-10-CM

## 2022-05-28 DIAGNOSIS — M359 Systemic involvement of connective tissue, unspecified: Secondary | ICD-10-CM

## 2022-05-28 DIAGNOSIS — R7301 Impaired fasting glucose: Secondary | ICD-10-CM

## 2022-05-28 DIAGNOSIS — E559 Vitamin D deficiency, unspecified: Secondary | ICD-10-CM

## 2022-05-28 DIAGNOSIS — F17200 Nicotine dependence, unspecified, uncomplicated: Secondary | ICD-10-CM | POA: Diagnosis not present

## 2022-05-28 DIAGNOSIS — Z0000 Encounter for general adult medical examination without abnormal findings: Secondary | ICD-10-CM | POA: Diagnosis not present

## 2022-05-28 NOTE — Progress Notes (Signed)
Complete physical exam  Patient: Brooke Kaiser   DOB: 1970-03-14   52 y.o. Female  MRN: [id number]  Subjective:    Chief Complaint  Patient presents with   Annual Exam    Brooke Kaiser is a 52 y.o. female who presents today for a complete physical exam. She reports consuming a general diet.  Has been walking some   She generally feels well. She reports sleeping fairly well. She does have additional problems to discuss today.    Most recent fall risk assessment:    05/28/2022   10:24 AM  Fall Risk   Falls in the past year? 0  Number falls in past yr: 0  Injury with Fall? 1  Risk for fall due to : Other (Comment)  Follow up Falls evaluation completed     Most recent depression screenings:    01/10/2022   10:27 AM 03/16/2021    5:01 PM  PHQ 2/9 Scores  PHQ - 2 Score 1 4  PHQ- 9 Score 5 15        Patient Care Team: Agapito Games, MD as PCP - General   Outpatient Medications Prior to Visit  Medication Sig   acetaminophen (TYLENOL) 500 MG tablet Take 1,000 mg by mouth daily.   albuterol (VENTOLIN HFA) 108 (90 Base) MCG/ACT inhaler INHALE 2-4 PUFFS INTO THE LUNGS 2 TIMES DAILY AS NEEDED.   lamoTRIgine (LAMICTAL) 100 MG tablet TAKE 1 TABLET BY MOUTH EVERY DAY   lidocaine (LMX) 4 % cream Apply 1 Application topically as needed (applies patch to lower back as needed, OTC).   losartan (COZAAR) 100 MG tablet TAKE 1 TABLET BY MOUTH EVERY DAY   venlafaxine XR (EFFEXOR XR) 75 MG 24 hr capsule Take 3 capsules (225 mg total) by mouth daily with breakfast.   [DISCONTINUED] cephALEXin (KEFLEX) 500 MG capsule Take 1 capsule every 6 hours by oral route for 7 days.   [DISCONTINUED] cyclobenzaprine (FLEXERIL) 5 MG tablet Take 1 tablet (5 mg total) by mouth 3 (three) times daily as needed for muscle spasms. (Patient not taking: Reported on 05/22/2022)   [DISCONTINUED] ibuprofen (ADVIL) 800 MG tablet Take 800 mg by mouth daily.   [DISCONTINUED] ibuprofen (ADVIL) 800 MG tablet  Take 1 tablet (800 mg total) by mouth every 8 (eight) hours as needed.   [DISCONTINUED] oxyCODONE (OXY IR/ROXICODONE) 5 MG immediate release tablet Take 1 tablet (5 mg total) by mouth every 6 (six) hours as needed for severe pain. (Patient not taking: Reported on 05/22/2022)   No facility-administered medications prior to visit.    ROS        Objective:     BP 134/62   Pulse 83   Ht 5\' 8"  (1.727 m)   Wt 254 lb (115.2 kg)   LMP 06/24/2013   SpO2 96%   BMI 38.62 kg/m    Physical Exam Vitals and nursing note reviewed. Exam conducted with a chaperone present.  Constitutional:      Appearance: She is well-developed.  HENT:     Head: Normocephalic and atraumatic.     Right Ear: External ear normal.     Left Ear: External ear normal.     Nose: Nose normal.  Eyes:     Conjunctiva/sclera: Conjunctivae normal.     Pupils: Pupils are equal, round, and reactive to light.  Neck:     Thyroid: No thyromegaly.  Cardiovascular:     Rate and Rhythm: Normal rate and regular rhythm.  Heart sounds: Normal heart sounds.  Pulmonary:     Effort: Pulmonary effort is normal.     Breath sounds: Normal breath sounds. No wheezing.  Chest:     Chest wall: No mass.  Breasts:    Right: Normal. No mass or nipple discharge.     Left: Normal. No mass or nipple discharge.  Musculoskeletal:     Cervical back: Neck supple.  Lymphadenopathy:     Cervical: No cervical adenopathy.  Skin:    General: Skin is warm and dry.  Neurological:     Mental Status: She is alert and oriented to person, place, and time.  Psychiatric:        Behavior: Behavior normal.      No results found for any visits on 05/28/22.     Assessment & Plan:    Routine Health Maintenance and Physical Exam  Immunization History  Administered Date(s) Administered   Influenza Split 01/15/2011, 11/13/2011   Influenza Whole 11/04/2007, 11/13/2011   Influenza, Seasonal, Injecte, Preservative Fre 12/16/2012    Meningococcal Conjugate 09/02/2012   PFIZER(Purple Top)SARS-COV-2 Vaccination 09/01/2019, 09/21/2019, 03/03/2020   Pneumococcal Polysaccharide-23 09/02/2012   Td 12/09/2008   Tdap 05/26/2019    Health Maintenance  Topic Date Due   Lung Cancer Screening  Never done   Zoster Vaccines- Shingrix (1 of 2) Never done   Pneumococcal Vaccine 54-27 Years old (2 of 2 - PCV) 06/28/2022 (Originally 09/02/2013)   COVID-19 Vaccine (4 - 2023-24 season) 01/13/2023 (Originally 09/29/2021)   INFLUENZA VACCINE  08/30/2022   PAP SMEAR-Modifier  09/11/2022   MAMMOGRAM  08/10/2023   DTaP/Tdap/Td (3 - Td or Tdap) 05/25/2029   COLONOSCOPY (Pts 45-45yrs Insurance coverage will need to be confirmed)  09/01/2030   Hepatitis C Screening  Completed   HIV Screening  Completed   HPV VACCINES  Aged Out    Discussed health benefits of physical activity, and encouraged her to engage in regular exercise appropriate for her age and condition.  Problem List Items Addressed This Visit       Endocrine   IFG (impaired fasting glucose)   Relevant Orders   Lipid Panel w/reflex Direct LDL   COMPLETE METABOLIC PANEL WITH GFR   CBC   Hemoglobin A1c   CT CARDIAC SCORING (SELF PAY ONLY)     Other   Vitamin D deficiency   Relevant Orders   VITAMIN D 25 Hydroxy (Vit-D Deficiency, Fractures)   Elevated hemoglobin (HCC)   Relevant Orders   Lipid Panel w/reflex Direct LDL   COMPLETE METABOLIC PANEL WITH GFR   CBC   CT CARDIAC SCORING (SELF PAY ONLY)   Collagen disease (HCC)   Relevant Orders   Lipid Panel w/reflex Direct LDL   COMPLETE METABOLIC PANEL WITH GFR   CBC   Other Visit Diagnoses     Wellness examination    -  Primary   Relevant Orders   Lipid Panel w/reflex Direct LDL   COMPLETE METABOLIC PANEL WITH GFR   CBC   Hemoglobin A1c   Smoker       Relevant Orders   Ambulatory Referral Lung Cancer Screening [facility]   CT CARDIAC SCORING (SELF PAY ONLY)   Hyperlipidemia, unspecified  hyperlipidemia type       Relevant Orders   CT CARDIAC SCORING (SELF PAY ONLY)       Keep up a regular exercise program and make sure you are eating a healthy diet Try to eat 4 servings of dairy a day, or  if you are lactose intolerant take a calcium with vitamin D daily.  Your vaccines are up to date. Ok for shingles vut wants to schedule for end of the week.    She is also very worried about her heart health.  We discussed working on strategies that she has control over such as diet, exercise, quitting smoking.  She is also a candidate for lung cancer screening.  We also discussed possibly doing a calcium CT score of the heart.  Smoker -refer for lung cancer screening.  No follow-ups on file.     Nani Gasser, MD

## 2022-06-08 ENCOUNTER — Ambulatory Visit (INDEPENDENT_AMBULATORY_CARE_PROVIDER_SITE_OTHER): Payer: Self-pay

## 2022-06-08 DIAGNOSIS — R7301 Impaired fasting glucose: Secondary | ICD-10-CM

## 2022-06-08 DIAGNOSIS — D582 Other hemoglobinopathies: Secondary | ICD-10-CM

## 2022-06-08 DIAGNOSIS — F17200 Nicotine dependence, unspecified, uncomplicated: Secondary | ICD-10-CM

## 2022-06-08 DIAGNOSIS — E785 Hyperlipidemia, unspecified: Secondary | ICD-10-CM

## 2022-06-08 DIAGNOSIS — Z9189 Other specified personal risk factors, not elsewhere classified: Secondary | ICD-10-CM

## 2022-06-11 MED ORDER — ROSUVASTATIN CALCIUM 10 MG PO TABS: 10.0000 mg | ORAL_TABLET | Freq: Every day | ORAL | 3 refills | Status: DC

## 2022-06-11 NOTE — Progress Notes (Signed)
Frederiksen, your calcium score was 288 this is in the 99th percentile for females your age.  This is extremely high and you would highly benefit from starting a statin to reduce your risk.  Go ahead and send over a prescription to your pharmacy to start.  Also they looked at the surrounding tissues on the scan.  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  They said it is benign and does not need any additional follow-up but they did noted on the scan.  Would like to recheck your lipids and liver enzymes in about 12 weeks on the new medication.

## 2022-06-26 ENCOUNTER — Other Ambulatory Visit (HOSPITAL_COMMUNITY): Payer: Self-pay | Admitting: Psychiatry

## 2022-06-26 ENCOUNTER — Other Ambulatory Visit: Payer: Self-pay | Admitting: Family Medicine

## 2022-06-27 ENCOUNTER — Telehealth (HOSPITAL_COMMUNITY): Payer: Self-pay | Admitting: *Deleted

## 2022-06-27 ENCOUNTER — Other Ambulatory Visit (HOSPITAL_COMMUNITY): Payer: Self-pay | Admitting: Psychiatry

## 2022-06-27 MED ORDER — VENLAFAXINE HCL ER 75 MG PO CP24: 225.0000 mg | ORAL_CAPSULE | Freq: Every day | ORAL | 2 refills | Status: DC

## 2022-06-27 NOTE — Telephone Encounter (Signed)
sent 

## 2022-06-27 NOTE — Telephone Encounter (Signed)
NEXT APPT: 07-24-22  enlafaxine XR (EFFEXOR XR) 75 MG 24 hr capsule Take 3 capsules (225 mg total) by mouth daily with breakfast. Dispense: 90 capsule, Refills: 2 ordered  03/19/2022 -- Thresa Ross, MD          Summary: Take 3 capsules (225 mg total)  by mouth daily with breakfast.,  Starting Mon 03/19/2022, Normal Dose, Route, Frequency: 225 mg,  Oral, Daily with breakfast Dx Associated: Start Date & Time: 02/19/2024Ord/Sold: 03/19/2022 (O)Ordered On: 02/19/2024ReportPharmacy: [id number] [place], [place] - [address]

## 2022-07-24 ENCOUNTER — Ambulatory Visit (INDEPENDENT_AMBULATORY_CARE_PROVIDER_SITE_OTHER): Payer: BC Managed Care – PPO | Admitting: Family Medicine

## 2022-07-24 ENCOUNTER — Other Ambulatory Visit: Payer: Self-pay

## 2022-07-24 VITALS — BP 128/80 | HR 83 | Ht 68.0 in | Wt 250.0 lb

## 2022-07-24 DIAGNOSIS — M65331 Trigger finger, right middle finger: Secondary | ICD-10-CM

## 2022-07-24 DIAGNOSIS — M79641 Pain in right hand: Secondary | ICD-10-CM

## 2022-07-24 DIAGNOSIS — M5441 Lumbago with sciatica, right side: Secondary | ICD-10-CM

## 2022-07-24 DIAGNOSIS — G8929 Other chronic pain: Secondary | ICD-10-CM

## 2022-07-24 DIAGNOSIS — M47816 Spondylosis without myelopathy or radiculopathy, lumbar region: Secondary | ICD-10-CM

## 2022-07-24 DIAGNOSIS — M5442 Lumbago with sciatica, left side: Secondary | ICD-10-CM | POA: Diagnosis not present

## 2022-07-24 MED ORDER — METHYLPREDNISOLONE ACETATE 80 MG/ML IJ SUSP
80.0000 mg | Freq: Once | INTRAMUSCULAR | Status: AC
  Administered 2022-07-24: 80 mg via INTRA_ARTICULAR

## 2022-07-24 NOTE — Patient Instructions (Addendum)
Thank you for coming in today.   Call or go to the ER if you develop a large red swollen joint with extreme pain or oozing puss.    Please call [facility] at [phone] to schedule your spine injection.    Let me know how this goes.   [email or web address]  Look for Patient Access Advocate II or Phlebotomist  Look for [facility] CMA academy.

## 2022-07-24 NOTE — Progress Notes (Signed)
Rubin Payor, PhD, LAT, ATC acting as a scribe for [name], MD.  Brooke Kaiser is a 52 y.o. female who presents to [facility] at [facility] today for 48-month f/u LBP w/ L anterior thigh paresthesias. Pt was last seen by Dr. Denyse Amass on 05/22/22 and was referred to [facility] PT in [place]. Last lumbar ESI was on 02/05/22.  Today, pt reports PT has been helpful. She missed a few PT visits lately due to complication w/ her mom's cancer care. Paresthesias cont in the anterior-lateral aspect of her L thigh. No lasting relief from [facility] in January.  She also notes that her R 3rd finger is starting to trigger again.  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  Dx imaging: 01/21/22 L-spine MRI             06/27/21 L-spine XR  Pertinent review of systems: No fevers or chills  Relevant historical information: Hypertension.  History of trigger finger.   Exam:  BP 128/80   Pulse 83   Ht 5\' 8"  (1.727 m)   Wt 250 lb (113.4 kg)   LMP 06/24/2013   SpO2 96%   BMI 38.01 kg/m  General: Well Developed, well nourished, and in no acute distress.   MSK: Right hand: Normal appearing. Tender palpation third MCP palmar aspect.  Triggering present with flexion of PIP joint.  L-spine: Decreased lumbar motion.    Lab and Radiology Results  Procedure: Real-time Ultrasound Guided Injection of right hand A1 pulley at third MCP (trigger finger injection) Device: Philips Affiniti 50G Images permanently stored and available for review in PACS Verbal informed consent obtained.  Discussed risks and benefits of procedure. Warned about infection, bleeding, hyperglycemia damage to structures among others. Patient expresses understanding and agreement Time-out conducted.   Noted no overlying erythema, induration, or other signs of local infection.   Skin prepped in a sterile  fashion.   Local anesthesia: Topical Ethyl chloride.   With sterile technique and under real time ultrasound guidance: 0.5 mL of 80 mg/mL Depo-Medrol solution and 0.5 mL of lidocaine injected into tendon sheath at third A1 pulley. Fluid seen entering the tendon sheath.   Completed without difficulty   Pain immediately resolved suggesting accurate placement of the medication.   Advised to call if fevers/chills, erythema, induration, drainage, or persistent bleeding.   Images permanently stored and available for review in the ultrasound unit.  Impression: Technically successful ultrasound guided injection.         Assessment and Plan: 52 y.o. female with right hand trigger finger repeat steroid injection today.  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  Plan for facet injection bilateral L4-5 and bilateral L5-S1.  We talked about job.  She is currently not working.  Her previous job was very demanding required digging holes.  She did get a phlebotomist certification and I helped looked at the [employer] website to try to find some openings.  Hopefully this will work for her and she can change careers to a job that is more suited to her body.   PDMP not reviewed this encounter. Orders Placed This Encounter  Procedures   DG FACET JT INJ L /S SINGLE LEVEL LEFT W/FL/CT    Standing Status:   Future    Standing Expiration Date:   07/24/2023    Order  Specific Question:   Reason for Exam (SYMPTOM  OR DIAGNOSIS REQUIRED)    Answer:   BL L4-5 and BL L5-S1    Order Specific Question:   Is the patient pregnant?    Answer:   No    Order Specific Question:   Preferred Imaging Location?    Answer:   [facility]    Order Specific Question:   Radiology Contrast Protocol - do NOT remove file path    Answer:   \\[email or web address]   DG FACET JT INJ L /S SINGLE LEVEL RIGHT W/FL/CT    Standing Status:   Future    Standing  Expiration Date:   07/24/2023    Order Specific Question:   Reason for Exam (SYMPTOM  OR DIAGNOSIS REQUIRED)    Answer:   BL L4-5 and BL L5-S1    Order Specific Question:   Is the patient pregnant?    Answer:   No    Order Specific Question:   Preferred Imaging Location?    Answer:   [facility]    Order Specific Question:   Radiology Contrast Protocol - do NOT remove file path    Answer:   \\[email or web address]   DG FACET JT INJ L /S  2ND LEVEL LEFT W/FL/CT    Standing Status:   Future    Standing Expiration Date:   07/24/2023    Order Specific Question:   Reason for Exam (SYMPTOM  OR DIAGNOSIS REQUIRED)    Answer:   BL L4-5 and BL L5-S1    Order Specific Question:   Is the patient pregnant?    Answer:   No    Order Specific Question:   Preferred Imaging Location?    Answer:   [facility]    Order Specific Question:   Radiology Contrast Protocol - do NOT remove file path    Answer:   \\[email or web address]   DG FACET JT INJ L /S 2ND LEVEL RIGHT W/FL/CT    Standing Status:   Future    Standing Expiration Date:   07/24/2023    Order Specific Question:   Reason for Exam (SYMPTOM  OR DIAGNOSIS REQUIRED)    Answer:   BL L4-5 and BL L5-S1    Order Specific Question:   Is the patient pregnant?    Answer:   No    Order Specific Question:   Preferred Imaging Location?    Answer:   [facility]    Order Specific Question:   Radiology Contrast Protocol - do NOT remove file path    Answer:   \\[email or web address]   [place] LIMITED JOINT SPACE STRUCTURES UP RIGHT(NO LINKED CHARGES)    Order Specific Question:   Reason for Exam (SYMPTOM  OR DIAGNOSIS REQUIRED)    Answer:   right hand pain    Order Specific Question:   Preferred imaging location?    Answer:   [facility] ordered this encounter  Medications   methylPREDNISolone acetate  (DEPO-MEDROL) injection 80 mg     Discussed warning signs or symptoms. Please see discharge instructions. Patient expresses understanding.   The above documentation has been reviewed and is accurate and complete [name], M.D.

## 2022-08-17 ENCOUNTER — Telehealth (HOSPITAL_COMMUNITY): Payer: Medicaid Other | Admitting: Psychiatry

## 2022-08-22 ENCOUNTER — Encounter: Payer: Self-pay | Admitting: Family Medicine

## 2022-08-30 NOTE — Discharge Instructions (Signed)

## 2022-08-31 ENCOUNTER — Ambulatory Visit
Admission: RE | Admit: 2022-08-31 | Discharge: 2022-08-31 | Disposition: A | Discharge status: Home / Self Care | Payer: Medicaid Other | Source: Ambulatory Visit | Attending: Family Medicine | Admitting: Family Medicine

## 2022-08-31 ENCOUNTER — Ambulatory Visit
Admission: RE | Admit: 2022-08-31 | Discharge: 2022-08-31 | Disposition: A | Discharge status: Home / Self Care | Payer: BC Managed Care – PPO | Source: Ambulatory Visit | Attending: Family Medicine | Admitting: Family Medicine

## 2022-08-31 DIAGNOSIS — G8929 Other chronic pain: Secondary | ICD-10-CM

## 2022-08-31 DIAGNOSIS — M47816 Spondylosis without myelopathy or radiculopathy, lumbar region: Secondary | ICD-10-CM

## 2022-08-31 MED ORDER — IOPAMIDOL (ISOVUE-M 200) INJECTION 41%
1.0000 mL | Freq: Once | INTRAMUSCULAR | Status: AC
  Administered 2022-08-31: 1 mL via INTRA_ARTICULAR

## 2022-08-31 MED ORDER — METHYLPREDNISOLONE ACETATE 40 MG/ML INJ SUSP (RADIOLOG
120.0000 mg | Freq: Once | INTRAMUSCULAR | Status: AC
  Administered 2022-08-31: 120 mg via INTRA_ARTICULAR

## 2022-09-05 ENCOUNTER — Encounter: Payer: Self-pay | Admitting: Family Medicine

## 2022-09-19 ENCOUNTER — Encounter (HOSPITAL_COMMUNITY): Payer: Self-pay | Admitting: Psychiatry

## 2022-09-19 ENCOUNTER — Telehealth (INDEPENDENT_AMBULATORY_CARE_PROVIDER_SITE_OTHER): Payer: BC Managed Care – PPO | Admitting: Psychiatry

## 2022-09-19 DIAGNOSIS — F411 Generalized anxiety disorder: Secondary | ICD-10-CM | POA: Diagnosis not present

## 2022-09-19 DIAGNOSIS — F39 Unspecified mood [affective] disorder: Secondary | ICD-10-CM | POA: Diagnosis not present

## 2022-09-19 DIAGNOSIS — F320 Major depressive disorder, single episode, mild: Secondary | ICD-10-CM | POA: Diagnosis not present

## 2022-09-19 DIAGNOSIS — F331 Major depressive disorder, recurrent, moderate: Secondary | ICD-10-CM

## 2022-09-19 DIAGNOSIS — F0630 Mood disorder due to known physiological condition, unspecified: Secondary | ICD-10-CM

## 2022-09-19 MED ORDER — VENLAFAXINE HCL ER 75 MG PO CP24: 225.0000 mg | ORAL_CAPSULE | Freq: Every day | ORAL | 2 refills | Status: DC

## 2022-09-19 MED ORDER — LAMOTRIGINE 100 MG PO TABS: 100.0000 mg | ORAL_TABLET | Freq: Every day | ORAL | 1 refills | Status: DC

## 2022-09-19 MED ORDER — LAMOTRIGINE 25 MG PO TABS: 25.0000 mg | ORAL_TABLET | Freq: Every day | ORAL | 1 refills | Status: DC

## 2022-09-19 NOTE — Progress Notes (Signed)
Patient ID: Brooke Kaiser, female   DOB: March 03, 1970, 52 y.o.   MRN: [id number] [facility] MD Progress Note  09/19/2022 3:10 PM EDWARD MINDER  MRN:  [id number]  Virtual Visit via Video Note  I connected with Brooke Kaiser on 09/19/22 at  3:00 PM EDT by a video enabled telemedicine application and verified that I am speaking with the correct person using two identifiers.  Location: Patient: home Provider: home office   I discussed the limitations of evaluation and management by telemedicine and the availability of in person appointments. The patient expressed understanding and agreed to proceed.      I discussed the assessment and treatment plan with the patient. The patient was provided an opportunity to ask questions and all were answered. The patient agreed with the plan and demonstrated an understanding of the instructions.   The patient was advised to call back or seek an in-person evaluation if the symptoms worsen or if the condition fails to improve as anticipated.  I provided 20 minutes of non-face-to-face time during this encounter.    HPI; patient was doing fair, but noticing increase anger, stressors related to mom and her sickness, finances having no job and upcoming marriage with her Fiance  At time misses evening dose and that causes anger as well  effexor  otherwise helps anxiety Modifying factor: jrelationship  Aggravating factor: mom can be Severity of depression: feeling subdued or angry at times No rash  No associated psychotic symptoms or mania.  Principal Problem: GAD and MDD Diagnosis:   Patient Active Problem List   Diagnosis Date Noted   Acute bilateral low back pain without sciatica [M54.50] 11/27/2021   Snoring [R06.83] 03/16/2021   Trigger middle finger of right hand [M65.331] 08/03/2020   Elevated hemoglobin (HCC) [D58.2] 03/04/2018   Vitamin D deficiency [E55.9] 03/04/2018   Gouty arthritis [M10.9] 11/04/2013   ANA positive [R76.8] 09/22/2013    Muscle ache [M79.10] 09/22/2013   Collagen disease (HCC) [M35.9] 09/22/2013   Elevated erythrocyte sedimentation rate [R70.0] 09/22/2013   IFG (impaired fasting glucose) [R73.01] 06/24/2012   Asthmatic bronchitis [J45.909] 10/16/2011   GAD (generalized anxiety disorder) [F41.1] 07/30/2011   Essential hypertension [I10] 04/07/2010   UNSPECIFIED ANEMIA [D64.9] 03/29/2010   Severe episode of recurrent major depressive disorder (HCC) [F33.2] 07/22/2009   OTHER ABNORMAL GLUCOSE [R73.09] 05/25/2008   HYPERTRIGLYCERIDEMIA [E78.1] 11/04/2007   FATIGUE [R53.81, R53.83] 11/04/2007   EXTERNAL HEMORRHOIDS [K64.4] 07/22/2007   BACK PAIN [M54.9] 07/22/2007   TOBACCO ABUSE [F17.200] 07/01/2007   GERD [K21.9] 07/01/2007   ABNORMAL THYROID FUNCTION TESTS [R94.6] 04/22/2007   Total Time spent with patient: 25 minutes   Past Medical History:  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  9-05   DILATATION & CURETTAGE/HYSTEROSCOPY WITH MYOSURE N/A 04/12/2022   Procedure: DILATATION & CURETTAGE/HYSTEROSCOPY WITH MYOSURE;  Surgeon: Carlisle Cater, MD;  Location: [facility];  Service: Gynecology;  Laterality: N/A;   LAPAROSCOPIC OVARIAN CYSTECTOMY Right 04/12/2022   Procedure: LAPAROSCOPIC OVARIAN CYSTECTOMY; RIGHT OOPHORECTOMY;  Surgeon: Carlisle Cater, MD;  Location: [facility];  Service: Gynecology;  Laterality: Right;  1.5hrs in OR  total per MD   LESION REMOVAL N/A 04/12/2022   Procedure: EXCISION VAGINAL LESION;  Surgeon: Carlisle Cater, MD;  Location: [facility];  Service: Gynecology;  Laterality: N/A;   Family History:  Family History  Problem Relation Age of Onset   Cancer Mother 38        Breast   Colon polyps Mother    Other Mother        colon polyps   Dementia Mother    Anxiety disorder Mother    Depression Mother    Paranoid behavior Mother    Depression Father    Suicidality Father        commited suicide   Alcohol abuse Father    Colon polyps Maternal Aunt        cancerous colon polyps   Cancer Maternal Uncle        cancerous colon polyps   Heart disease Other    Other Other        thyroid disorder   Alcohol abuse Paternal Uncle    Dementia Maternal Grandmother    Alcohol abuse Paternal Grandmother    Alcohol abuse Paternal Uncle    Social History:  Social History   Substance and Sexual Activity  Alcohol Use Yes   Alcohol/week: 0.3 standard drinks of alcohol   Comment: rarely 2-3 times per year     Social History   Substance and Sexual Activity  Drug Use No    Social History   Socioeconomic History   Marital status: Significant Other    Spouse name: Not on file   Number of children: Not on file   Years of education: Not on file   Highest education level: Not on file  Occupational History   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[employer]    Employer: [employer]  Tobacco Use   Smoking status: Every Day    Current packs/day: 1.00    Average packs/day: 1 pack/day for 37.0 years (37.0 ttl pk-yrs)    Types: Cigarettes   Smokeless tobacco: Never   Tobacco comments:    1 pack a day   Vaping Use   Vaping status: Never Used  Substance and Sexual Activity   Alcohol use: Yes    Alcohol/week: 0.3 standard drinks of alcohol    Comment: rarely 2-3 times per year   Drug use: No   Sexual activity: Yes    Partners: Female    Comment: prefers female sex partner  Other Topics Concern   Not on file  Social History Narrative   Not on file   Social Determinants of Health   Financial Resource Strain: Not on file  Food Insecurity: Not on file  Transportation Needs: Not on file  Physical Activity: Not on file  Stress: Not on file  Social Connections:  Not on file   Additional History:    Sleep: Fair  Appetite:  Fair   Assessment: GAD stable, MDD stable      Psychiatric Specialty Exam: Physical Exam  Review of Systems  Cardiovascular:  Negative for chest pain.  Skin:  Negative for rash.  Psychiatric/Behavioral:  Positive for depression. Negative for hallucinations and suicidal ideas.     Last menstrual period 06/24/2013.There is no height or weight on file to calculate BMI.  General Appearance: Neat and Well Groomed  Eye Contact::  Good  Speech:  Clear and Coherent  Volume:  Normal  Mood: stressed, gets edgy  Affect:  congruent  Thought Process:  Goal Directed, Intact, Linear  and Logical  Orientation:  Full (Time, Place, and Person)  Thought Content:  WDL  Suicidal Thoughts:  No  Homicidal Thoughts:  No  Memory:  Immediate;   Good Recent;   Good Remote;   Good  Judgement:  Good  Insight:  Present  Psychomotor Activity:  Normal  Concentration:  Good  Recall:  Good  Fund of Knowledge:Good  Language: Good  Akathisia:  No  Handed:  Right  AIMS (if indicated):     Assets:  Communication Skills Desire for Improvement Housing Intimacy Leisure Time Physical Health Resilience Social Support Talents/Skills Transportation Vocational/Educational  ADL's:  Intact  Cognition: WNL  Sleep:  Fair  Notes she is sleeping during the day.     Current Medications: Current Outpatient Prescriptions  Medication Sig Dispense Refill   lamoTRIgine (LAMICTAL) 100 MG tablet Take one tablet at day for mood stabilization. 30 tablet 1   losartan-hydrochlorothiazide (HYZAAR) 100-25 MG per tablet Take 1 tablet by mouth daily. 30 tablet 5   venlafaxine XR (EFFEXOR-XR) 75 MG 24 hr capsule Take 3 capsules (225 mg total) by mouth daily with breakfast. 90 capsule 0   VENTOLIN HFA 108 (90 BASE) MCG/ACT inhaler INHALE 4 PUFFS INTO THE LUNGS 2 TIMES DAILY AS NEEDED. 18 Inhaler 3   No current facility-administered medications for this  visit.    Lab Results:  No results found for this or any previous visit (from the past 48 hour(s)).  Physical Findings: AIMS: CIWA:   COWS:    Treatment Plan Summary: Medication management  Prior documentation reviewed   MDD : somewhat subdued, increase lamictal to 125mg  from 100mg  consider therapy    Mood disorder:getting edgy, compliance discussed, increase lamictal to 125mg  from 100mg   GAD: fluctuates, effexor does help, consider therapy to help in coping  Fu 40m.    Brooke Kaiser  3:10 PM 09/19/2022

## 2022-09-21 ENCOUNTER — Encounter: Payer: Self-pay | Admitting: Emergency Medicine

## 2022-09-28 NOTE — Progress Notes (Deleted)
   Rubin Payor, PhD, LAT, ATC acting as a scribe for [name], MD.  Brooke Kaiser is a 52 y.o. female who presents to [facility] at [facility] today for bilat hand pain. Pt was last seen by Dr. Denyse Amass on 09/05/22 for R 3rd trigger finger that was injection and facet injections were ordered for the lumbar spine.  Today, pt c/o both fingers/hands "locking up." Seems to occur when doing gripping activities; holding onto the steering wheel, chopping vegetables. Pt locates pain to ***   Pertinent review of systems: ***  Relevant historical information: ***   Exam:  LMP 06/24/2013  General: Well Developed, well nourished, and in no acute distress.   MSK: ***    Lab and Radiology Results No results found for this or any previous visit (from the past 72 hour(s)). No results found.     Assessment and Plan: 52 y.o. female with ***   PDMP not reviewed this encounter. No orders of the defined types were placed in this encounter.  No orders of the defined types were placed in this encounter.    Discussed warning signs or symptoms. Please see discharge instructions. Patient expresses understanding.   ***

## 2022-10-02 ENCOUNTER — Ambulatory Visit: Payer: BC Managed Care – PPO | Admitting: Family Medicine

## 2022-10-14 ENCOUNTER — Other Ambulatory Visit (HOSPITAL_COMMUNITY): Payer: Self-pay | Admitting: Psychiatry

## 2022-10-23 ENCOUNTER — Encounter: Payer: Self-pay | Admitting: Family Medicine

## 2022-10-23 ENCOUNTER — Ambulatory Visit (INDEPENDENT_AMBULATORY_CARE_PROVIDER_SITE_OTHER): Payer: Medicaid Other | Admitting: Family Medicine

## 2022-10-23 VITALS — BP 124/84 | HR 90 | Ht 68.0 in | Wt 245.0 lb

## 2022-10-23 DIAGNOSIS — R252 Cramp and spasm: Secondary | ICD-10-CM | POA: Diagnosis not present

## 2022-10-23 DIAGNOSIS — M79641 Pain in right hand: Secondary | ICD-10-CM

## 2022-10-23 DIAGNOSIS — M79642 Pain in left hand: Secondary | ICD-10-CM | POA: Diagnosis not present

## 2022-10-23 LAB — COMPREHENSIVE METABOLIC PANEL
ALT: 33 U/L (ref 0–35)
AST: 19 U/L (ref 0–37)
Albumin: 4.2 g/dL (ref 3.5–5.2)
Alkaline Phosphatase: 59 U/L (ref 39–117)
BUN: 9 mg/dL (ref 6–23)
CO2: 27 mEq/L (ref 19–32)
Calcium: 9.6 mg/dL (ref 8.4–10.5)
Chloride: 104 mEq/L (ref 96–112)
Creatinine, Ser: 0.71 mg/dL (ref 0.40–1.20)
GFR: 97.47 mL/min (ref >60.00)
Glucose, Bld: 146 mg/dL — ABNORMAL HIGH (ref 70–99)
Potassium: 4.1 mEq/L (ref 3.5–5.1)
Sodium: 138 mEq/L (ref 135–145)
Total Bilirubin: 0.4 mg/dL (ref 0.2–1.2)
Total Protein: 7.1 g/dL (ref 6.0–8.3)

## 2022-10-23 LAB — TSH: TSH: 1.15 u[IU]/mL (ref 0.35–5.50)

## 2022-10-23 LAB — MAGNESIUM: Magnesium: 1.7 mg/dL (ref 1.5–2.5)

## 2022-10-23 LAB — CK: Total CK: 49 U/L (ref 7–177)

## 2022-10-23 MED ORDER — BACLOFEN 10 MG PO TABS: 10.0000 mg | ORAL_TABLET | Freq: Three times a day (TID) | ORAL | 2 refills | Status: DC | PRN

## 2022-10-23 NOTE — Patient Instructions (Signed)
Thank you for coming in today.   Please get labs today before you leave   Try baclofen. Let me know how it goes.   If not better we can do more including nerve study. I can order that with a phone call or mychart message.

## 2022-10-23 NOTE — Progress Notes (Signed)
   I, Stevenson Clinch, CMA acting as a scribe for Brooke Graham, MD.  Brooke Kaiser is a 52 y.o. female who presents to [facility] at [facility] today for hand pain. Pt was last seen by Dr. Denyse Amass on 07/24/22 for trigger finger and chronic LBP. Pt received steroid injection for trigger finger and order was placed for back injections.   Today, patient reports that the hands and fingers will randomly lock up, sometimes one hand, sometimes the order, sometimes both. This has happened while driving. Denies swelling. There is slight pain when the hands lock up. When the hands lock up, she has to sit on the hands or manipulate them. She notes a cramping sensation in her hands and forearm that precipitate the locking up sensation.  She notes this feels different than trigger finger.  Dx imaging: 01/21/22 L-spine MRI             06/27/21 L-spine XR  Pertinent review of systems: No fevers or chills  Relevant historical information: History carpal tunnel syndrome.   Exam:  BP 124/84   Pulse 90   Ht 5\' 8"  (1.727 m)   Wt 245 lb (111.1 kg)   LMP 06/24/2013   SpO2 94%   BMI 37.25 kg/m  General: Well Developed, well nourished, and in no acute distress.   MSK: Normal-appearing hands bilaterally. Mildly positive Tinel's and Phalen's test bilateral hands.  Grip strength and sensation are intact.  No triggering.  Pulses and capillary refill and sensation are intact distally.        Assessment and Plan: 52 y.o. female with bilateral hand cramping.  Etiology is unclear.  There may be a component of carpal tunnel syndrome.  There may be an electrolyte abnormality.  Will check basic labs listed below and continue carpal tunnel wrist braces and try baclofen especially at bedtime.  If insufficient next step may be nerve conduction study.  She will let me know and I am happy to order it.   PDMP not reviewed this encounter. Orders Placed This Encounter  Procedures   Comprehensive  metabolic panel    Standing Status:   Future    Number of Occurrences:   1    Standing Expiration Date:   10/23/2023   TSH    Standing Status:   Future    Number of Occurrences:   1    Standing Expiration Date:   10/23/2023   Magnesium    Standing Status:   Future    Number of Occurrences:   1    Standing Expiration Date:   10/23/2023   CK (Creatine Kinase)    Standing Status:   Future    Number of Occurrences:   1    Standing Expiration Date:   10/23/2023   Meds ordered this encounter  Medications   baclofen (LIORESAL) 10 MG tablet    Sig: Take 1 tablet (10 mg total) by mouth 3 (three) times daily as needed for muscle spasms.    Dispense:  30 each    Refill:  2     Discussed warning signs or symptoms. Please see discharge instructions. Patient expresses understanding.   The above documentation has been reviewed and is accurate and complete Brooke Kaiser, M.D.

## 2022-10-24 NOTE — Progress Notes (Signed)
No explanation for cramping is visible in your labs.  Your blood sugar is mildly elevated.

## 2022-11-11 ENCOUNTER — Other Ambulatory Visit (HOSPITAL_COMMUNITY): Payer: Self-pay | Admitting: Psychiatry

## 2022-11-28 ENCOUNTER — Telehealth (INDEPENDENT_AMBULATORY_CARE_PROVIDER_SITE_OTHER): Payer: BC Managed Care – PPO | Admitting: Psychiatry

## 2022-11-28 ENCOUNTER — Encounter (HOSPITAL_COMMUNITY): Payer: Self-pay | Admitting: Psychiatry

## 2022-11-28 DIAGNOSIS — F331 Major depressive disorder, recurrent, moderate: Secondary | ICD-10-CM

## 2022-11-28 DIAGNOSIS — F0630 Mood disorder due to known physiological condition, unspecified: Secondary | ICD-10-CM | POA: Diagnosis not present

## 2022-11-28 DIAGNOSIS — F5102 Adjustment insomnia: Secondary | ICD-10-CM | POA: Diagnosis not present

## 2022-11-28 DIAGNOSIS — F411 Generalized anxiety disorder: Secondary | ICD-10-CM

## 2022-11-28 MED ORDER — TRAZODONE HCL 50 MG PO TABS: 50.0000 mg | ORAL_TABLET | Freq: Every day | ORAL | 0 refills | Status: DC

## 2022-11-28 MED ORDER — LAMOTRIGINE 100 MG PO TABS: 100.0000 mg | ORAL_TABLET | Freq: Every day | ORAL | 1 refills | Status: DC

## 2022-11-28 MED ORDER — LAMOTRIGINE 25 MG PO TABS: 25.0000 mg | ORAL_TABLET | Freq: Every day | ORAL | 0 refills | Status: DC

## 2022-11-28 NOTE — Progress Notes (Signed)
Patient ID: Brooke Kaiser, female   DOB: [birth year], 52 y.o.   MRN: [id number] [facility] MD Progress Note  11/28/2022 2:08 PM Brooke Kaiser  MRN:  [id number]     HPI; Patient was subdued last visit, we increased lamictal. Doing some better, no rash, finances still stressful Difficult to sleep and snores has had used trazadone before it helped Now compliant with meds so improved effexor  otherwise helps anxiety Modifying factor: relationship  Aggravating factor: mom can be Severity of depression: some better  No associated psychotic symptoms or mania.  Principal Problem: GAD and MDD Diagnosis:   Patient Active Problem List   Diagnosis Date Noted   Acute bilateral low back pain without sciatica [M54.50] 11/27/2021   Snoring [R06.83] 03/16/2021   Trigger middle finger of right hand [M65.331] 08/03/2020   Elevated hemoglobin (HCC) [D58.2] 03/04/2018   Vitamin D deficiency [E55.9] 03/04/2018   Gouty arthritis [M10.9] 11/04/2013   ANA positive [R76.8] 09/22/2013   Muscle ache [M79.10] 09/22/2013   Collagen disease (HCC) [M35.9] 09/22/2013   Elevated erythrocyte sedimentation rate [R70.0] 09/22/2013   IFG (impaired fasting glucose) [R73.01] 06/24/2012   Asthmatic bronchitis [J45.909] 10/16/2011   GAD (generalized anxiety disorder) [F41.1] 07/30/2011   Essential hypertension [I10] 04/07/2010   UNSPECIFIED ANEMIA [D64.9] 03/29/2010   Severe episode of recurrent major depressive disorder (HCC) [F33.2] 07/22/2009   OTHER ABNORMAL GLUCOSE [R73.09] 05/25/2008   HYPERTRIGLYCERIDEMIA [E78.1] 11/04/2007   FATIGUE [R53.81, R53.83] 11/04/2007   External hemorrhoids [K64.4] 07/22/2007   Backache [M54.9] 07/22/2007   TOBACCO ABUSE [F17.200] 07/01/2007   GERD [K21.9] 07/01/2007   ABNORMAL THYROID FUNCTION TESTS [R94.6] 04/22/2007   Total Time spent with patient: 25 minutes   Past Medical History:  Past Medical History:  Diagnosis Date   Abnormal thyroid blood test    Anemia    Anxiety     Asthma    Depression    GERD (gastroesophageal reflux disease)    Hypertension    Tobacco abuse     Past Surgical History:  Procedure Laterality Date   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  9-05   DILATATION & CURETTAGE/HYSTEROSCOPY WITH MYOSURE N/A 04/12/2022   Procedure: DILATATION & CURETTAGE/HYSTEROSCOPY WITH MYOSURE;  Surgeon: Carlisle Cater, MD;  Location: [facility];  Service: Gynecology;  Laterality: N/A;   LAPAROSCOPIC OVARIAN CYSTECTOMY Right 04/12/2022   Procedure: LAPAROSCOPIC OVARIAN CYSTECTOMY; RIGHT OOPHORECTOMY;  Surgeon: Carlisle Cater, MD;  Location: [facility];  Service: Gynecology;  Laterality: Right;  1.5hrs in OR total per MD   LESION REMOVAL N/A 04/12/2022   Procedure: EXCISION VAGINAL LESION;  Surgeon: Carlisle Cater, MD;  Location: [facility];  Service: Gynecology;  Laterality: N/A;   Family History:  Family History  Problem Relation Age of Onset   Cancer Mother 14       Breast   Colon polyps Mother    Other Mother        colon polyps   Dementia Mother    Anxiety disorder Mother    Depression Mother    Paranoid behavior Mother    Depression Father    Suicidality Father        commited suicide   Alcohol abuse Father    Colon polyps Maternal Aunt        cancerous colon polyps   Cancer Maternal Uncle        cancerous colon polyps   Heart disease Other  Other Other        thyroid disorder   Alcohol abuse Paternal Uncle    Dementia Maternal Grandmother    Alcohol abuse Paternal Grandmother    Alcohol abuse Paternal Uncle    Social History:  Social History   Substance and Sexual Activity  Alcohol Use Yes   Alcohol/week: 0.3 standard drinks of alcohol   Comment: rarely 2-3 times per year     Social History   Substance and Sexual Activity  Drug Use No    Social History   Socioeconomic History   Marital status: Significant Other    Spouse name: Not on file   Number  of children: Not on file   Years of education: Not on file   Highest education level: Not on file  Occupational History   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[employer]    Employer: [employer]  Tobacco Use   Smoking status: Every Day    Current packs/day: 1.00    Average packs/day: 1 pack/day for 37.0 years (37.0 ttl pk-yrs)    Types: Cigarettes   Smokeless tobacco: Never   Tobacco comments:    1 pack a day   Vaping Use   Vaping status: Never Used  Substance and Sexual Activity   Alcohol use: Yes    Alcohol/week: 0.3 standard drinks of alcohol    Comment: rarely 2-3 times per year   Drug use: No   Sexual activity: Yes    Partners: Female    Comment: prefers female sex partner  Other Topics Concern   Not on file  Social History Narrative   Not on file   Social Determinants of Health   Financial Resource Strain: Not on file  Food Insecurity: Not on file  Transportation Needs: Not on file  Physical Activity: Not on file  Stress: Not on file  Social Connections: Not on file   Additional History:    Sleep: Fair  Appetite:  Fair   Assessment: GAD stable, MDD stable      Psychiatric Specialty Exam: Physical Exam  Review of Systems  Cardiovascular:  Negative for chest pain.  Skin:  Negative for rash.  Psychiatric/Behavioral:  Negative for hallucinations and suicidal ideas. The patient has insomnia.     Last menstrual period 06/24/2013.There is no height or weight on file to calculate BMI.  General Appearance: Neat and Well Groomed  Eye Contact::  Good  Speech:  Clear and Coherent  Volume:  Normal  Mood: some better  Affect:  congruent  Thought Process:  Goal Directed, Intact, Linear and Logical  Orientation:  Full (Time, Place, and Person)  Thought Content:  WDL  Suicidal Thoughts:  No  Homicidal Thoughts:  No  Memory:  Immediate;   Good Recent;   Good Remote;   Good  Judgement:  Good  Insight:  Present  Psychomotor Activity:  Normal  Concentration:  Good  Recall:   Good  Fund of Knowledge:Good  Language: Good  Akathisia:  No  Handed:  Right  AIMS (if indicated):     Assets:  Communication Skills Desire for Improvement Housing Intimacy Leisure Time Physical Health Resilience Social Support Talents/Skills Transportation Vocational/Educational  ADL's:  Intact  Cognition: WNL  Sleep:  irregular sleep      Current Medications: Current Outpatient Prescriptions  Medication Sig Dispense Refill   lamoTRIgine (LAMICTAL) 100 MG tablet Take one tablet at day for mood stabilization. 30 tablet 1   losartan-hydrochlorothiazide (HYZAAR) 100-25 MG per tablet Take 1 tablet by mouth daily.  30 tablet 5   venlafaxine XR (EFFEXOR-XR) 75 MG 24 hr capsule Take 3 capsules (225 mg total) by mouth daily with breakfast. 90 capsule 0   VENTOLIN HFA 108 (90 BASE) MCG/ACT inhaler INHALE 4 PUFFS INTO THE LUNGS 2 TIMES DAILY AS NEEDED. 18 Inhaler 3   No current facility-administered medications for this visit.    Lab Results:  No results found for this or any previous visit (from the past 48 hour(s)).  Physical Findings: AIMS: CIWA:   COWS:    Treatment Plan Summary: Medication management  Prior documentation reviewed   MDD : better cotinue lamictal 125mg .   Mood disorder:some better continjue lamictal  GAD: fluctautes but manageable continue effexor  Insomnia: reviewed sleep hgyiene can start trazadone again small dose, consider sleep study to rule out sleep apnea, she will connect with PCP    Fu 25m.   Thresa Ross  2:08 PM 11/28/2022

## 2022-12-05 ENCOUNTER — Ambulatory Visit: Payer: Medicaid Other | Admitting: Family Medicine

## 2022-12-05 ENCOUNTER — Other Ambulatory Visit: Payer: Self-pay

## 2022-12-05 VITALS — BP 132/82 | HR 75 | Ht 68.0 in | Wt 249.0 lb

## 2022-12-05 DIAGNOSIS — M65331 Trigger finger, right middle finger: Secondary | ICD-10-CM | POA: Diagnosis not present

## 2022-12-05 DIAGNOSIS — R202 Paresthesia of skin: Secondary | ICD-10-CM

## 2022-12-05 DIAGNOSIS — M79641 Pain in right hand: Secondary | ICD-10-CM

## 2022-12-05 MED ORDER — PREDNISONE 50 MG PO TABS: 50.0000 mg | ORAL_TABLET | Freq: Every day | ORAL | 0 refills | Status: DC

## 2022-12-05 MED ORDER — GABAPENTIN 300 MG PO CAPS: 300.0000 mg | ORAL_CAPSULE | Freq: Three times a day (TID) | ORAL | 3 refills | Status: DC | PRN

## 2022-12-05 NOTE — Progress Notes (Signed)
Brooke Payor, PhD, LAT, ATC acting as a scribe for Brooke Graham, MD.  Brooke Kaiser is a 52 y.o. female who presents to [facility] today for exacerbation of her R hand pain. Pt was last seen by Dr. Denyse Amass on 10/23/22 for bilat hand cramping. She was last seen for R 3rd trigger finger on 07/24/22 and was given a steroid injection.  Today, pt reports R 3rd finger started hurting about 2-wks ago. She is now having "popping" along the PIP joint. Triggering present as well.   She would also like to discuss next steps for the LBP. She notes over the last 2 days she is having numbness in her R leg.  Numbness extends into the anterior thigh.  She is right-hand dominant.  Dx testing: 10/23/22 Labs  Pertinent review of systems: No fevers or chills  Relevant historical information: Hypertension   Exam:  BP 132/82   Pulse 75   Ht 5\' 8"  (1.727 m)   Wt 249 lb (112.9 kg)   LMP 06/24/2013   SpO2 96%   BMI 37.86 kg/m  General: Well Developed, well nourished, and in no acute distress.   MSK: Right hand normal appearing. Tender palpation palmar third MCP.  Unable to fully flex the PIP joint. Strength is intact.  L-spine nontender midline.  Normal lumbar motion. Lower extremity strength is intact.   Lab and Radiology Results   EXAM: MRI LUMBAR SPINE WITHOUT CONTRAST   TECHNIQUE: Multiplanar, multisequence MR imaging of the lumbar spine was performed. No intravenous contrast was administered.   COMPARISON:  None Available.   FINDINGS: Segmentation:  Standard.   Alignment:  Mild levocurvature   Vertebrae:  Borderline marrow edema at the right L4-5 facet   Conus medullaris and cauda equina: Conus extends to the L1-2 level. Conus and cauda equina appear normal.   Paraspinal and other soft tissues: Minimally covered cystic structure that is right eccentric on the lowest axial slices, correlating with 07/03/2021 pelvic ultrasound   Disc  levels:   T12- L1: Mild ventral spondylitic spurring   L1-L2: Unremarkable.   L2-L3: Slight disc desiccation with leftward bulging.   L3-L4: Minor disc bulging with left inferior foraminal protrusion. No neural compression   L4-L5: Disc narrowing with bulge and right paracentral to foraminal herniation. Right subarticular recess narrowing that could affect the right L5 nerve root. Degenerative facet spurring on both sides. Patent foramina with mild stenosis on the right   L5-S1:Disc narrowing and bulging eccentric to the left where there is greater endplate spurring. Degenerative facet spurring on both sides. Moderate left foraminal stenosis.   Due to incidental finding with no visible follow-up in the chart, these results will be called to the ordering clinician or representative by the Radiologist Assistant, and communication documented in the PACS or [employer].   IMPRESSION: 1. Minimally covered large cyst in the right pelvis, correlating with adnexal cyst described on ultrasound 07/03/2021, please ensure there has been gynecology follow-up. 2. Lumbar spine degeneration with facet spurring and small herniations described above. Moderate stenosis at the right L4-5 subarticular recess and left L5-S1 foramen.     Electronically Signed   By: Tiburcio Pea M.D.   On: 01/24/2022 10:16 I, Brooke Kaiser, personally (independently) visualized and performed the interpretation of the images attached in this note.    Assessment and Plan: 52 y.o. female with recurrent right trigger finger.  Last injection was June of this year.  The interval between injections  is diminishing indicating that we are reaching diminishing returns on these injections.  Discussed options and I think it is time to have a consultation with a hand surgeon for surgical planning.  Plan refer to hand surgery.  As for the right thigh symptoms this has been ongoing for about 2 days.  Etiology is unclear but  it seems consistent with L3 radiculopathy.  She does not have much abnormality at the right L3 neuroforamen on MRI December 2023.  Plan for course of prednisone and gabapentin.  If symptoms worsening could consider updating MRI.   PDMP not reviewed this encounter. Orders Placed This Encounter  Procedures   Ambulatory referral to Orthopedic Surgery    Referral Priority:   Routine    Referral Type:   Surgical    Referral Reason:   Specialty Services Required    Referred to Provider:   Samuella Cota, MD    Requested Specialty:   Orthopedic Surgery    Number of Visits Requested:   1   Meds ordered this encounter  Medications   predniSONE (DELTASONE) 50 MG tablet    Sig: Take 1 tablet (50 mg total) by mouth daily.    Dispense:  5 tablet    Refill:  0   gabapentin (NEURONTIN) 300 MG capsule    Sig: Take 1 capsule (300 mg total) by mouth 3 (three) times daily as needed.    Dispense:  90 capsule    Refill:  3     Discussed warning signs or symptoms. Please see discharge instructions. Patient expresses understanding.   The above documentation has been reviewed and is accurate and complete Brooke Kaiser, M.D.

## 2022-12-05 NOTE — Patient Instructions (Addendum)
Thank you for coming in today.   I've referred you to Orthopedic Surgery (Dr. Fara Boros" Agarwala).  Let us know if you don't hear from them in one week.   Let me know how it goes  Try the gabeptnin as needed at bedtime.   Take the prednisone for 5 days.

## 2022-12-21 ENCOUNTER — Other Ambulatory Visit (HOSPITAL_COMMUNITY): Payer: Self-pay | Admitting: Psychiatry

## 2023-01-14 ENCOUNTER — Ambulatory Visit (INDEPENDENT_AMBULATORY_CARE_PROVIDER_SITE_OTHER): Payer: Medicaid Other | Admitting: Orthopedic Surgery

## 2023-01-14 ENCOUNTER — Other Ambulatory Visit (INDEPENDENT_AMBULATORY_CARE_PROVIDER_SITE_OTHER): Payer: Medicaid Other

## 2023-01-14 DIAGNOSIS — M65331 Trigger finger, right middle finger: Secondary | ICD-10-CM

## 2023-01-14 DIAGNOSIS — M79641 Pain in right hand: Secondary | ICD-10-CM

## 2023-01-14 NOTE — Progress Notes (Signed)
Brooke Kaiser - 52 y.o. female MRN [id number]  Date of birth: [birth year]  Office Visit Note: Visit Date: 01/14/2023 PCP: Agapito Games, MD Referred by: Rodolph Bong, MD  Subjective: No chief complaint on file.  HPI: Brooke Kaiser is a pleasant 52 y.o. female who presents today for evaluation of right hand long finger trigger digit that has been present now for multiple years, worsening in nature.  She has undergone prior injections to this area x 2, most recently in June of this year without lasting relief.  She does have significant locking requiring manual correction at this point which is quite painful.  Denies any numbness or tingling.  Pertinent ROS were reviewed with the patient and found to be negative unless otherwise specified above in HPI.   Visit Reason: right hand long finger trigger Duration of symptoms: Multiple years Hand dominance: right Occupation: disabled Diabetic: No Smoking: Yes Heart/Lung History: none Blood Thinners: none  Prior Testing/EMG: none Injections (Date): 07/25/22, one prior (unsure of date) Treatments: injection x2 Prior Surgery:none  Assessment & Plan: Visit Diagnoses:  1. Pain in right hand     Plan: Extensive discussion was had the patient today regarding her left long trigger digit.  I explained the pathophysiology and etiology of stenosing tenosynovitis in this region.  Given that her symptoms are refractory to conservative care in the form of activity modification and prior injections x 2, she is appropriate candidate for left long finger trigger digit release.  We discussed treat modalities range from conservative to surgical, conservative being ongoing activity modification possible reinjection, therapy and splinting.  However, given that her symptoms are quite severe at this juncture, she is interested in moving forward with surgery which I am in agreement with.  She is indicated for left long finger trigger digit release  under local anesthesia.  Risks and benefits of the procedure were discussed, risks including but not limited to infection, bleeding, scarring, stiffness, nerve injury, tendon injury, vascular injury, recurrence of symptoms and need for subsequent operation.  Patient expressed understanding.      Follow-up: No follow-ups on file.   Meds & Orders: No orders of the defined types were placed in this encounter.   Orders Placed This Encounter  Procedures   XR Hand Complete Right     Procedures: No procedures performed      Clinical History: No specialty comments available.  She reports that she has been smoking cigarettes. She has a 37 pack-year smoking history. She has never used smokeless tobacco. No results for input(s): "HGBA1C", "LABURIC" in the last 8760 hours.  Objective:   Vital Signs: LMP 06/24/2013   Physical Exam  Gen: Well-appearing, in no acute distress; non-toxic CV: Regular Rate. Well-perfused. Warm.  Resp: Breathing unlabored on room air; no wheezing. Psych: Fluid speech in conversation; appropriate affect; normal thought process  Ortho Exam PHYSICAL EXAM:  General: Patient is well appearing and in no distress. Cervical spine mobility is full in all directions:  Skin and Muscle: No significant skin changes are apparent to upper extremities.  Muscle bulk and contour normal, no signs of atrophy.     Range of Motion and Palpation Tests: Mobility is full about the elbows with flexion and extension.  Forearm supination and pronation are 85/85 bilaterally.  Wrist flexion/extension is 75/65 bilaterally.  Digital flexion and extension are full.  Thumb opposition is full to the base of the small fingers bilaterally.    Palpable nodule at the right  long finger A1 pulley with associated tenderness.  Notable triggering is observed of the right long finger with deep flexion.  Neurologic, Vascular, Motor: Sensation is intact to light touch in the median/radial/ulnar  distributions.   Fingers pink and well perfused.  Capillary refill is brisk.      Lab Results  Component Value Date   HGBA1C 6.3 (H) 06/29/2020     Imaging: XR Hand Complete Right Result Date: 01/14/2023 X-rays of the right hand, multiple views were obtained today X-rays demonstrate stable appearance of the radiocarpal and midcarpal articulations without significant bony abnormalities.  Bone mineralization is appropriate.   Past Medical/Family/Surgical/Social History: Medications & Allergies reviewed per EMR, new medications updated. Patient Active Problem List   Diagnosis Date Noted   Acute bilateral low back pain without sciatica 11/27/2021   Snoring 03/16/2021   Trigger middle finger of right hand 08/03/2020   Elevated hemoglobin (HCC) 03/04/2018   Vitamin D deficiency 03/04/2018   Gouty arthritis 11/04/2013   ANA positive 09/22/2013   Muscle ache 09/22/2013   Collagen disease (HCC) 09/22/2013   Elevated erythrocyte sedimentation rate 09/22/2013   IFG (impaired fasting glucose) 06/24/2012   Asthmatic bronchitis 10/16/2011   GAD (generalized anxiety disorder) 07/30/2011   Essential hypertension 04/07/2010   UNSPECIFIED ANEMIA 03/29/2010   Severe episode of recurrent major depressive disorder (HCC) 07/22/2009   OTHER ABNORMAL GLUCOSE 05/25/2008   HYPERTRIGLYCERIDEMIA 11/04/2007   FATIGUE 11/04/2007   External hemorrhoids 07/22/2007   Backache 07/22/2007   TOBACCO ABUSE 07/01/2007   GERD 07/01/2007   ABNORMAL THYROID FUNCTION TESTS 04/22/2007   Past Medical History:  Diagnosis Date   Abnormal thyroid blood test    Anemia    Anxiety    Asthma    Depression    GERD (gastroesophageal reflux disease)    Hypertension    Tobacco abuse    Family History  Problem Relation Age of Onset   Cancer Mother 7       Breast   Colon polyps Mother    Other Mother        colon polyps   Dementia Mother    Anxiety disorder Mother    Depression Mother    Paranoid behavior  Mother    Depression Father    Suicidality Father        commited suicide   Alcohol abuse Father    Colon polyps Maternal Aunt        cancerous colon polyps   Cancer Maternal Uncle        cancerous colon polyps   Heart disease Other    Other Other        thyroid disorder   Alcohol abuse Paternal Uncle    Dementia Maternal Grandmother    Alcohol abuse Paternal Grandmother    Alcohol abuse Paternal Uncle    Past Surgical History:  Procedure Laterality Date   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  9-05   DILATATION & CURETTAGE/HYSTEROSCOPY WITH MYOSURE N/A 04/12/2022   Procedure: DILATATION & CURETTAGE/HYSTEROSCOPY WITH MYOSURE;  Surgeon: Carlisle Cater, MD;  Location: [facility];  Service: Gynecology;  Laterality: N/A;   LAPAROSCOPIC OVARIAN CYSTECTOMY Right 04/12/2022   Procedure: LAPAROSCOPIC OVARIAN CYSTECTOMY; RIGHT OOPHORECTOMY;  Surgeon: Carlisle Cater, MD;  Location: [facility];  Service: Gynecology;  Laterality: Right;  1.5hrs in OR total per MD   LESION REMOVAL N/A 04/12/2022   Procedure: EXCISION VAGINAL LESION;  Surgeon: Carlisle Cater, MD;  Location:  [facility];  Service: Gynecology;  Laterality: N/A;   Social History   Occupational History   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TECH    Employer: [employer]  Tobacco Use   Smoking status: Every Day    Current packs/day: 1.00    Average packs/day: 1 pack/day for 37.0 years (37.0 ttl pk-yrs)    Types: Cigarettes   Smokeless tobacco: Never   Tobacco comments:    1 pack a day   Vaping Use   Vaping status: Never Used  Substance and Sexual Activity   Alcohol use: Yes    Alcohol/week: 0.3 standard drinks of alcohol    Comment: rarely 2-3 times per year   Drug use: No   Sexual activity: Yes    Partners: Female    Comment: prefers female sex partner    Tasharra Nodine Fara Boros) Denese Killings, M.D. [facility] 2:40 PM

## 2023-01-29 ENCOUNTER — Ambulatory Visit: Payer: BC Managed Care – PPO | Admitting: Family Medicine

## 2023-02-11 ENCOUNTER — Other Ambulatory Visit (HOSPITAL_COMMUNITY): Payer: Self-pay | Admitting: Psychiatry

## 2023-02-11 ENCOUNTER — Encounter (HOSPITAL_COMMUNITY): Payer: Self-pay | Admitting: Psychiatry

## 2023-02-11 ENCOUNTER — Telehealth (HOSPITAL_COMMUNITY): Payer: Medicaid Other | Admitting: Psychiatry

## 2023-02-11 DIAGNOSIS — F331 Major depressive disorder, recurrent, moderate: Secondary | ICD-10-CM | POA: Diagnosis not present

## 2023-02-11 DIAGNOSIS — F411 Generalized anxiety disorder: Secondary | ICD-10-CM | POA: Diagnosis not present

## 2023-02-11 DIAGNOSIS — F5102 Adjustment insomnia: Secondary | ICD-10-CM

## 2023-02-11 DIAGNOSIS — F0630 Mood disorder due to known physiological condition, unspecified: Secondary | ICD-10-CM

## 2023-02-11 DIAGNOSIS — G4700 Insomnia, unspecified: Secondary | ICD-10-CM

## 2023-02-11 MED ORDER — LAMOTRIGINE 25 MG PO TABS: 25.0000 mg | ORAL_TABLET | Freq: Every day | ORAL | 0 refills | Status: DC

## 2023-02-11 NOTE — Progress Notes (Signed)
 Patient ID: Brooke Kaiser, female   DOB: 1970-03-18, 53 y.o.   MRN: [id number] [facility] MD Progress Note  02/11/2023 2:36 PM KEVEN SOUCY  MRN:  [id number]   Virtual Visit via Video Note  I connected with Edna JULIANNA Boxer on 02/11/23 at  2:30 PM EST by a video enabled telemedicine application and verified that I am speaking with the correct person using two identifiers.  Location: Patient: home Provider: home office   I discussed the limitations of evaluation and management by telemedicine and the availability of in person appointments. The patient expressed understanding and agreed to proceed.      I discussed the assessment and treatment plan with the patient. The patient was provided an opportunity to ask questions and all were answered. The patient agreed with the plan and demonstrated an understanding of the instructions.   The patient was advised to call back or seek an in-person evaluation if the symptoms worsen or if the condition fails to improve as anticipated.  I provided 18 minutes of non-face-to-face time during this encounter.    HPI; on eval doing fair, meds increase of lamictal  prior to last visit has been helping  No rash , sleeps fair or have to use trazadone at times Difficult dealing with mom so she keeps away   Realtionship is going well Modifying factor: relationship  Aggravating factor: mom can be Severity of depression:better  No associated psychotic symptoms or mania.  Principal Problem: GAD and MDD Diagnosis:   Patient Active Problem List   Diagnosis Date Noted   Acute bilateral low back pain without sciatica [M54.50] 11/27/2021   Snoring [R06.83] 03/16/2021   Trigger middle finger of right hand [M65.331] 08/03/2020   Elevated hemoglobin (HCC) [D58.2] 03/04/2018   Vitamin D  deficiency [E55.9] 03/04/2018   Gouty arthritis [M10.9] 11/04/2013   ANA positive [R76.8] 09/22/2013   Muscle ache [M79.10] 09/22/2013   Collagen disease (HCC) [M35.9] 09/22/2013    Elevated erythrocyte sedimentation rate [R70.0] 09/22/2013   IFG (impaired fasting glucose) [R73.01] 06/24/2012   Asthmatic bronchitis [J45.909] 10/16/2011   GAD (generalized anxiety disorder) [F41.1] 07/30/2011   Essential hypertension [I10] 04/07/2010   UNSPECIFIED ANEMIA [D64.9] 03/29/2010   Severe episode of recurrent major depressive disorder (HCC) [F33.2] 07/22/2009   OTHER ABNORMAL GLUCOSE [R73.09] 05/25/2008   HYPERTRIGLYCERIDEMIA [E78.1] 11/04/2007   FATIGUE [R53.81, R53.83] 11/04/2007   External hemorrhoids [K64.4] 07/22/2007   Backache [M54.9] 07/22/2007   TOBACCO ABUSE [F17.200] 07/01/2007   GERD [K21.9] 07/01/2007   ABNORMAL THYROID  FUNCTION TESTS [R94.6] 04/22/2007   Total Time spent with patient: 25 minutes   Past Medical History:  Past Medical History:  Diagnosis Date   Abnormal thyroid  blood test    Anemia    Anxiety    Asthma    Depression    GERD (gastroesophageal reflux disease)    Hypertension    Tobacco abuse     Past Surgical History:  Procedure Laterality Date   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  9-05   DILATATION & CURETTAGE/HYSTEROSCOPY WITH MYOSURE N/A 04/12/2022   Procedure: DILATATION & CURETTAGE/HYSTEROSCOPY WITH MYOSURE;  Surgeon: Sudie Lavonia HERO, MD;  Location: [facility];  Service: Gynecology;  Laterality: N/A;   LAPAROSCOPIC OVARIAN CYSTECTOMY Right 04/12/2022   Procedure: LAPAROSCOPIC OVARIAN CYSTECTOMY; RIGHT OOPHORECTOMY;  Surgeon: Sudie Lavonia HERO, MD;  Location: [facility];  Service: Gynecology;  Laterality: Right;  1.5hrs in OR total per MD   LESION REMOVAL N/A 04/12/2022  Procedure: EXCISION VAGINAL LESION;  Surgeon: Sudie Lavonia HERO, MD;  Location: [facility];  Service: Gynecology;  Laterality: N/A;   Family History:  Family History  Problem Relation Age of Onset   Cancer Mother 60       Breast   Colon polyps Mother    Other Mother        colon polyps    Dementia Mother    Anxiety disorder Mother    Depression Mother    Paranoid behavior Mother    Depression Father    Suicidality Father        commited suicide   Alcohol abuse Father    Colon polyps Maternal Aunt        cancerous colon polyps   Cancer Maternal Uncle        cancerous colon polyps   Heart disease Other    Other Other        thyroid  disorder   Alcohol abuse Paternal Uncle    Dementia Maternal Grandmother    Alcohol abuse Paternal Grandmother    Alcohol abuse Paternal Uncle    Social History:  Social History   Substance and Sexual Activity  Alcohol Use Yes   Alcohol/week: 0.3 standard drinks of alcohol   Comment: rarely 2-3 times per year     Social History   Substance and Sexual Activity  Drug Use No    Social History   Socioeconomic History   Marital status: Significant Other    Spouse name: Not on file   Number of children: Not on file   Years of education: Not on file   Highest education level: Not on file  Occupational History   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[employer]    Employer: [employer]  Tobacco Use   Smoking status: Every Day    Current packs/day: 1.00    Average packs/day: 1 pack/day for 37.0 years (37.0 ttl pk-yrs)    Types: Cigarettes   Smokeless tobacco: Never   Tobacco comments:    1 pack a day   Vaping Use   Vaping status: Never Used  Substance and Sexual Activity   Alcohol use: Yes    Alcohol/week: 0.3 standard drinks of alcohol    Comment: rarely 2-3 times per year   Drug use: No   Sexual activity: Yes    Partners: Female    Comment: prefers female sex partner  Other Topics Concern   Not on file  Social History Narrative   Not on file   Social Drivers of Health   Financial Resource Strain: Not on file  Food Insecurity: Not on file  Transportation Needs: Not on file  Physical Activity: Not on file  Stress: Not on file  Social Connections: Not on file   Additional History:    Sleep: Fair  Appetite:   Fair   Assessment: GAD stable, MDD stable      Psychiatric Specialty Exam: Physical Exam  Review of Systems  Cardiovascular:  Negative for chest pain.  Skin:  Negative for rash.  Psychiatric/Behavioral:  Negative for hallucinations and suicidal ideas.     Last menstrual period 06/24/2013.There is no height or weight on file to calculate BMI.  General Appearance: Neat and Well Groomed  Patent Attorney::  Good  Speech:  Clear and Coherent  Volume:  Normal  Mood: some better  Affect:  congruent  Thought Process:  Goal Directed, Intact, Linear and Logical  Orientation:  Full (Time, Place, and Person)  Thought Content:  WDL  Suicidal Thoughts:  No  Homicidal Thoughts:  No  Memory:  Immediate;   Good Recent;   Good Remote;   Good  Judgement:  Good  Insight:  Present  Psychomotor Activity:  Normal  Concentration:  Good  Recall:  Good  Fund of Knowledge:Good  Language: Good  Akathisia:  No  Handed:  Right  AIMS (if indicated):     Assets:  Communication Skills Desire for Improvement Housing Intimacy Leisure Time Physical Health Resilience Social Support Talents/Skills Transportation Vocational/Educational  ADL's:  Intact  Cognition: WNL  Sleep:  irregular sleep      Current Medications: Current Outpatient Prescriptions  Medication Sig Dispense Refill   lamoTRIgine  (LAMICTAL ) 100 MG tablet Take one tablet at day for mood stabilization. 30 tablet 1   losartan -hydrochlorothiazide (HYZAAR) 100-25 MG per tablet Take 1 tablet by mouth daily. 30 tablet 5   venlafaxine  XR (EFFEXOR -XR) 75 MG 24 hr capsule Take 3 capsules (225 mg total) by mouth daily with breakfast. 90 capsule 0   VENTOLIN  HFA 108 (90 BASE) MCG/ACT inhaler INHALE 4 PUFFS INTO THE LUNGS 2 TIMES DAILY AS NEEDED. 18 Inhaler 3   No current facility-administered medications for this visit.    Lab Results:  No results found for this or any previous visit (from the past 48 hours).  Physical  Findings: AIMS: CIWA:   COWS:    Treatment Plan Summary: Medication management  Prior documentation reviewed   MDD : better continue lamictal  at 125mg     Mood disorder:some better continjue lamictal   GAD: manageable continue effexor    Insomnia: doing fair at times have to use trazadone, continue sleep hygiene    Fu  6 m.   Jackey Flight  2:36 PM 02/11/2023

## 2023-02-13 ENCOUNTER — Encounter: Payer: Self-pay | Admitting: Family Medicine

## 2023-02-15 ENCOUNTER — Encounter: Payer: Self-pay | Admitting: Orthopedic Surgery

## 2023-02-20 ENCOUNTER — Other Ambulatory Visit: Payer: Self-pay | Admitting: Family Medicine

## 2023-03-07 DIAGNOSIS — M65331 Trigger finger, right middle finger: Secondary | ICD-10-CM | POA: Diagnosis not present

## 2023-03-19 ENCOUNTER — Telehealth: Payer: Self-pay | Admitting: Orthopedic Surgery

## 2023-03-19 NOTE — Telephone Encounter (Signed)
Patient called regarding post op appt. She advised to give her a call back please.  [phone]

## 2023-03-19 NOTE — Telephone Encounter (Signed)
24th  8:15am

## 2023-03-20 ENCOUNTER — Encounter: Payer: Medicaid Other | Admitting: Orthopedic Surgery

## 2023-03-25 ENCOUNTER — Telehealth (HOSPITAL_COMMUNITY): Payer: Self-pay | Admitting: *Deleted

## 2023-03-25 ENCOUNTER — Ambulatory Visit (INDEPENDENT_AMBULATORY_CARE_PROVIDER_SITE_OTHER): Payer: Medicaid Other | Admitting: Orthopedic Surgery

## 2023-03-25 DIAGNOSIS — M65331 Trigger finger, right middle finger: Secondary | ICD-10-CM

## 2023-03-25 MED ORDER — TRAZODONE HCL 50 MG PO TABS: 50.0000 mg | ORAL_TABLET | Freq: Every day | ORAL | 0 refills | Status: DC

## 2023-03-25 NOTE — Progress Notes (Signed)
   Brooke Kaiser - 53 y.o. female MRN [id number]  Date of birth: 05-25-1970  Office Visit Note: Visit Date: 03/25/2023 PCP: Agapito Games, MD Referred by: Agapito Games, *  Subjective:  HPI: Brooke Kaiser is a 53 y.o. female who presents today for follow up 2 weeks status post right long finger trigger digit release.  She is doing well overall, denies any residual clicking or locking.  Pertinent ROS were reviewed with the patient and found to be negative unless otherwise specified above in HPI.   Assessment & Plan: Visit Diagnoses:  1. Trigger finger, right middle finger     Plan: She is doing well postoperatively.  Wound is demonstrating appropriate healing, sutures removed today.  Wound care discussed again in detail.  Referral sent for occupational therapy for range of motion exercises and scar massage.  Follow-up in approximately 1 month for recheck.  Follow-up: No follow-ups on file.   Meds & Orders: No orders of the defined types were placed in this encounter.   Orders Placed This Encounter  Procedures   Ambulatory referral to Occupational Therapy     Procedures: No procedures performed       Objective:   Vital Signs: LMP 06/24/2013   Ortho Exam Right hand: - Well-healing incision at the base of the long finger, small area of dehiscence of the skin layer, appropriate granulation tissue beneath, no erythema or drainage, sutures removed - Able to perform digital range of motion without residual clicking or locking, long finger approximately 2 cm from distal palmar crease active flexion, slightly improved passively without significant pain - Sensation intact distally, hand is warm well-perfused   Imaging: No results found.   Ra Pfiester Trevor Mace, M.D. [facility], Hand Surgery

## 2023-03-25 NOTE — Addendum Note (Signed)
 Addended by: Thresa Ross on: 03/25/2023 10:20 AM   Modules accepted: Orders

## 2023-03-25 NOTE — Telephone Encounter (Signed)
 Rx  REFILL REQUEST  traZODone (DESYREL) 50 MG tablet  LAST FILL DATE ::  12/24/22  NEXT APPT  08/12/23 LAST APPT   02/11/23

## 2023-04-17 ENCOUNTER — Encounter: Payer: Medicaid Other | Admitting: Orthopedic Surgery

## 2023-04-18 ENCOUNTER — Encounter: Payer: Self-pay | Admitting: Family Medicine

## 2023-04-18 ENCOUNTER — Ambulatory Visit (INDEPENDENT_AMBULATORY_CARE_PROVIDER_SITE_OTHER): Payer: Medicaid Other | Admitting: Family Medicine

## 2023-04-18 VITALS — BP 122/72 | HR 73 | Ht 68.0 in | Wt 254.0 lb

## 2023-04-18 DIAGNOSIS — Z1231 Encounter for screening mammogram for malignant neoplasm of breast: Secondary | ICD-10-CM

## 2023-04-18 DIAGNOSIS — I10 Essential (primary) hypertension: Secondary | ICD-10-CM | POA: Diagnosis not present

## 2023-04-18 DIAGNOSIS — K2100 Gastro-esophageal reflux disease with esophagitis, without bleeding: Secondary | ICD-10-CM

## 2023-04-18 DIAGNOSIS — Z638 Other specified problems related to primary support group: Secondary | ICD-10-CM

## 2023-04-18 DIAGNOSIS — R053 Chronic cough: Secondary | ICD-10-CM

## 2023-04-18 DIAGNOSIS — F332 Major depressive disorder, recurrent severe without psychotic features: Secondary | ICD-10-CM

## 2023-04-18 DIAGNOSIS — E559 Vitamin D deficiency, unspecified: Secondary | ICD-10-CM | POA: Diagnosis not present

## 2023-04-18 DIAGNOSIS — R7301 Impaired fasting glucose: Secondary | ICD-10-CM

## 2023-04-18 NOTE — Assessment & Plan Note (Signed)
 She is not currently on any medication for reflux she has been trying to take less Tums recently.  We did discuss cutting back on caffeine intake chocolate spicy foods and acidic foods.  She said she had already been cutting back on soda.  But does drink a lot of coffee during the day.  Discussed that GERD could be causing her chronic cough as well and it could be contributing.

## 2023-04-18 NOTE — Assessment & Plan Note (Addendum)
 Follows with Dr. Gilmore Laroche, psychiatry for her medications.  She is currently on Lamictal and venlafaxine.

## 2023-04-18 NOTE — Assessment & Plan Note (Signed)
 Due for updated A1c

## 2023-04-18 NOTE — Progress Notes (Signed)
 Established Patient Office Visit  Subjective  Patient ID: LISSETT FAVORITE, female    DOB: July 14, 1970  Age: 53 y.o. MRN: [id number]  Chief Complaint  Patient presents with   Hypertension   ifg    HPI Last seen in April 2024.  Unfortunately she was not able to return for labs though she did have a few labs done in the fall with sports medicine to evaluate for possible causes of cramping.  Also c/o of cough x 6 months. No recent illnesses. Sometime dry and sometime wet.  + smoker.  Does have GERD and has been trying not to take so many TUMS  Home is stressful with her mother living with her.  She has seen some therapist in the past but they always seem to leave so she has been hesitant to start with a new therapist.  She had surgery for her trigger finger since I last saw her.  She also had a left ovarian cyst removed back in March.  And her partner Tresa Endo got married.  For lipidemia-she is no longer taking her Crestor.     ROS    Objective:     BP 122/72   Pulse 73   Ht 5\' 8"  (1.727 m)   Wt 254 lb (115.2 kg)   LMP 06/24/2013   SpO2 96%   BMI 38.62 kg/m    Physical Exam Vitals and nursing note reviewed.  Constitutional:      Appearance: Normal appearance.  HENT:     Head: Normocephalic and atraumatic.  Eyes:     Conjunctiva/sclera: Conjunctivae normal.  Cardiovascular:     Rate and Rhythm: Normal rate and regular rhythm.  Pulmonary:     Effort: Pulmonary effort is normal.     Breath sounds: Normal breath sounds.  Skin:    General: Skin is warm and dry.  Neurological:     Mental Status: She is alert.  Psychiatric:        Mood and Affect: Mood normal.      No results found for any visits on 04/18/23.    The ASCVD Risk score (Arnett DK, et al., 2019) failed to calculate for the following reasons:   Cannot find a previous HDL lab   Cannot find a previous total cholesterol lab    Assessment & Plan:   Problem List Items Addressed This Visit        Cardiovascular and Mediastinum   Essential hypertension - Primary   Well controlled. Continue current regimen. Follow up in  67mo       Relevant Orders   CMP14+EGFR   Lipid panel   CBC   Hemoglobin A1c   Vitamin D (25 hydroxy)     Digestive   GERD   She is not currently on any medication for reflux she has been trying to take less Tums recently.  We did discuss cutting back on caffeine intake chocolate spicy foods and acidic foods.  She said she had already been cutting back on soda.  But does drink a lot of coffee during the day.  Discussed that GERD could be causing her chronic cough as well and it could be contributing.        Endocrine   IFG (impaired fasting glucose)   Due for updated A1c.      Relevant Orders   CMP14+EGFR   Lipid panel   CBC   Hemoglobin A1c   Vitamin D (25 hydroxy)     Other   Vitamin D  deficiency   Relevant Orders   CMP14+EGFR   Lipid panel   CBC   Hemoglobin A1c   Vitamin D (25 hydroxy)   Severe episode of recurrent major depressive disorder (HCC)   Follows with Dr. Gilmore Laroche, psychiatry for her medications.  She is currently on Lamictal and venlafaxine.      Other Visit Diagnoses       Encounter for screening mammogram for malignant neoplasm of breast       Relevant Orders   MM 3D SCREENING MAMMOGRAM BILATERAL BREAST     Stress due to family tension       Relevant Orders   Ambulatory referral to Behavioral Health     Chronic cough       Relevant Orders   DG Chest 2 View      Cough-will get plain film chest x-ray first if negative consider postnasal drip versus GERD.  It may be worth a trial of a PPI for 4 to 6 weeks to see if symptoms improve though I did encourage her to work on some dietary changes in regards to her reflux symptoms and additional handout was provided.  Also smoking cessation would be helpful as well.  Return in about 6 months (around 10/19/2023) for Hypertension.    Nani Gasser, MD

## 2023-04-18 NOTE — Assessment & Plan Note (Signed)
 Well controlled. Continue current regimen. Follow up in  6 mo

## 2023-04-19 ENCOUNTER — Encounter: Payer: Self-pay | Admitting: Family Medicine

## 2023-04-19 LAB — LIPID PANEL
Chol/HDL Ratio: 6.1 ratio — ABNORMAL HIGH (ref 0.0–4.4)
Cholesterol, Total: 184 mg/dL (ref 100–199)
HDL: 30 mg/dL — ABNORMAL LOW (ref >39)
LDL Chol Calc (NIH): 112 mg/dL — ABNORMAL HIGH (ref 0–99)
Triglycerides: 240 mg/dL — ABNORMAL HIGH (ref 0–149)
VLDL Cholesterol Cal: 42 mg/dL — ABNORMAL HIGH (ref 5–40)

## 2023-04-19 LAB — CMP14+EGFR
ALT: 51 IU/L — ABNORMAL HIGH (ref 0–32)
AST: 24 IU/L (ref 0–40)
Albumin: 4.4 g/dL (ref 3.8–4.9)
Alkaline Phosphatase: 58 IU/L (ref 44–121)
BUN/Creatinine Ratio: 19 (ref 9–23)
BUN: 14 mg/dL (ref 6–24)
Bilirubin Total: <0.2 mg/dL (ref 0.0–1.2)
CO2: 22 mmol/L (ref 20–29)
Calcium: 9.3 mg/dL (ref 8.7–10.2)
Chloride: 104 mmol/L (ref 96–106)
Creatinine, Ser: 0.75 mg/dL (ref 0.57–1.00)
Globulin, Total: 2.2 g/dL (ref 1.5–4.5)
Glucose: 154 mg/dL — ABNORMAL HIGH (ref 70–99)
Potassium: 4.5 mmol/L (ref 3.5–5.2)
Sodium: 140 mmol/L (ref 134–144)
Total Protein: 6.6 g/dL (ref 6.0–8.5)
eGFR: 95 mL/min/{1.73_m2} (ref >59)

## 2023-04-19 LAB — CBC
Hematocrit: 48.7 % — ABNORMAL HIGH (ref 34.0–46.6)
Hemoglobin: 16.4 g/dL — ABNORMAL HIGH (ref 11.1–15.9)
MCH: 30.5 pg (ref 26.6–33.0)
MCHC: 33.7 g/dL (ref 31.5–35.7)
MCV: 91 fL (ref 79–97)
Platelets: 268 10*3/uL (ref 150–450)
RBC: 5.38 x10E6/uL — ABNORMAL HIGH (ref 3.77–5.28)
RDW: 12.7 % (ref 11.7–15.4)
WBC: 8.9 10*3/uL (ref 3.4–10.8)

## 2023-04-19 LAB — HEMOGLOBIN A1C
Est. average glucose Bld gHb Est-mCnc: 160 mg/dL
Hgb A1c MFr Bld: 7.2 % — ABNORMAL HIGH (ref 4.8–5.6)

## 2023-04-19 LAB — VITAMIN D 25 HYDROXY (VIT D DEFICIENCY, FRACTURES): Vit D, 25-Hydroxy: 53.4 ng/mL (ref 30.0–100.0)

## 2023-04-19 MED ORDER — METFORMIN HCL ER 500 MG PO TB24: 500.0000 mg | ORAL_TABLET | Freq: Every day | ORAL | 1 refills | Status: DC

## 2023-04-19 NOTE — Progress Notes (Signed)
 Brooke Kaiser, ALT, liver enzyme, is mildly elevated it was normal 5 months ago.  Just continue to work on healthy diet that will help reduce the inflammation in the liver make sure to avoid any excess Tylenol or alcohol products.  Your triglycerides are still elevated but they do look a little better.  And your LDL is also just mildly elevated again just continuing to work on improving to the diet more vegetables and less carbs starches.  Hemoglobin is slightly elevated this is from smoking so encourage you to continue to work on smoking cessation.  A1c was 7.2 in the diabetes range.  Anything 6.5 or higher is no longer prediabetes but is considered full-blown diabetes.  Based on that number I would like to put you on metformin to help reduce your glucose levels and then see you back in 3 to 4 months to see how you are doing and recheck an A1c at that time.  Vitamin D looks good.

## 2023-04-19 NOTE — Addendum Note (Signed)
 Addended by: Nani Gasser D on: 04/19/2023 07:45 AM   Modules accepted: Orders

## 2023-04-23 ENCOUNTER — Encounter: Payer: Medicaid Other | Admitting: Orthopedic Surgery

## 2023-04-29 ENCOUNTER — Telehealth: Payer: Self-pay | Admitting: Family Medicine

## 2023-04-29 DIAGNOSIS — R053 Chronic cough: Secondary | ICD-10-CM

## 2023-04-29 DIAGNOSIS — F17200 Nicotine dependence, unspecified, uncomplicated: Secondary | ICD-10-CM

## 2023-04-29 NOTE — Telephone Encounter (Signed)
 Referral placed.

## 2023-04-29 NOTE — Telephone Encounter (Signed)
 Copied from CRM [id number]. Topic: Referral - Request for Referral >> Apr 29, 2023  3:34 PM Philippa Chester F wrote: Reason for CRM:   Patient is requesting that Dr. Linford Arnold rewrite the current pulmonary referral so it extends past June as there are no available appointments in person until May. And the patient wants to ensure they have time to get a visit.   Is there are any questions or concerns please contact the patient at [phone].

## 2023-05-01 ENCOUNTER — Encounter: Payer: Self-pay | Admitting: Family Medicine

## 2023-05-06 ENCOUNTER — Telehealth: Admitting: Family Medicine

## 2023-05-07 ENCOUNTER — Ambulatory Visit: Attending: Occupational Therapy | Admitting: Occupational Therapy

## 2023-05-08 ENCOUNTER — Ambulatory Visit (INDEPENDENT_AMBULATORY_CARE_PROVIDER_SITE_OTHER): Admitting: Family Medicine

## 2023-05-08 VITALS — BP 130/84 | HR 80 | Ht 68.0 in | Wt 249.0 lb

## 2023-05-08 DIAGNOSIS — G8929 Other chronic pain: Secondary | ICD-10-CM | POA: Diagnosis not present

## 2023-05-08 DIAGNOSIS — M47816 Spondylosis without myelopathy or radiculopathy, lumbar region: Secondary | ICD-10-CM

## 2023-05-08 DIAGNOSIS — M545 Low back pain, unspecified: Secondary | ICD-10-CM

## 2023-05-08 NOTE — Progress Notes (Unsigned)
   Rubin Payor, PhD, LAT, ATC acting as a scribe for [name], MD.  Brooke Kaiser is a 53 y.o. female who presents to [facility] today for exacerbation of her LBP. Pt was last seen for her back by Dr. Denyse Amass on 07/24/22 and facet injections were ordered, done on 08/31/22.  Today, pt reports facet injections were not helpful. Pt locates pain to across both sides of her low back, w/ radiating pain along the lateral aspect of both thighs. She also has an area of midline pain towards the distal portion of her l-spine. This pain is limiting her ability to walk and her PCP is concerned about her A1c.  Radiating pain: yes LE numbness/tingling: yes LE weakness: yes Aggravates: walking, prolonged sitting or standing,   Dx imaging: 01/21/22 L-spine MRI             06/27/21 L-spine XR  Pertinent review of systems: ***  Relevant historical information: ***   Exam:  LMP 06/24/2013  General: Well Developed, well nourished, and in no acute distress.   MSK: ***    Lab and Radiology Results No results found for this or any previous visit (from the past 72 hours). No results found.     Assessment and Plan: 53 y.o. female with ***   PDMP not reviewed this encounter. No orders of the defined types were placed in this encounter.  No orders of the defined types were placed in this encounter.    Discussed warning signs or symptoms. Please see discharge instructions. Patient expresses understanding.   ***

## 2023-05-08 NOTE — Patient Instructions (Addendum)
 Thank you for coming in today.   I've referred you to Dr. Lorrine Kin.  Let us know if you don't hear from them in one week.

## 2023-05-15 ENCOUNTER — Telehealth (HOSPITAL_COMMUNITY): Payer: Self-pay | Admitting: *Deleted

## 2023-05-15 MED ORDER — VENLAFAXINE HCL ER 75 MG PO CP24: 225.0000 mg | ORAL_CAPSULE | Freq: Every day | ORAL | 0 refills | Status: DC

## 2023-05-15 NOTE — Telephone Encounter (Signed)
 Rx REQUEST [id number] - [place], [place] - [address] (Ph: [phone])    venlafaxine XR (EFFEXOR-XR) 75 MG 24 hr capsule   NEXT APPT  08/12/23 LAST APPT   02/11/23

## 2023-05-15 NOTE — Addendum Note (Signed)
 Addended by: Wray Heady on: 05/15/2023 12:26 PM   Modules accepted: Orders

## 2023-05-23 ENCOUNTER — Encounter: Payer: Self-pay | Admitting: Family Medicine

## 2023-05-24 ENCOUNTER — Encounter: Payer: Self-pay | Admitting: Family Medicine

## 2023-05-24 NOTE — Telephone Encounter (Signed)
 Forwarding to Dr. Denyse Amass to review and advise.

## 2023-05-28 ENCOUNTER — Telehealth: Payer: Self-pay

## 2023-05-28 NOTE — Telephone Encounter (Signed)
 Maridee F Tonkinson to [facility] (supporting Syliva Even, MD)      05/27/23 12:55 PM Thank you  Messages that only contain a simple "Thank you" are hidden in In Basket to save you clicks.   05/27/23 11:31 AM You routed this conversation to Me Me to Jaydalynn F Meline      05/27/23 11:31 AM Good Morning Rochella,  I have not received any new forms yet. I will keep an out of for them today.    Take care,    Normand Beckwith, CMA  Last read by Murial F Witucki at 12:55PM on 05/27/2023. Velora F Vanderford to [facility] (supporting Syliva Even, MD)      05/24/23  2:24 PM Hi, I received a call from [employer] letting me know they need updated paperwork .  I confirmed they sent that request to you.  Just letting you y'all know.   Thank you

## 2023-05-29 NOTE — Telephone Encounter (Signed)
 Request received from [employer] for medical records from 10/2022 - present.   Notes printed and placed at the front desk to fax.

## 2023-05-30 NOTE — Telephone Encounter (Signed)
Called pt, left VM to call the office.  

## 2023-05-31 NOTE — Telephone Encounter (Signed)
 Patient called back after everyone was gone. Please call patient back or advise with what information is needed.

## 2023-05-31 NOTE — Telephone Encounter (Signed)
Called pt, left VM to call the office.  

## 2023-06-03 NOTE — Telephone Encounter (Signed)
 Needs letter stating disability for SNAP benefits.

## 2023-06-04 NOTE — Telephone Encounter (Signed)
 Letter drafted and sent to pt via My Chart

## 2023-06-10 ENCOUNTER — Ambulatory Visit: Admitting: Orthopedic Surgery

## 2023-06-12 ENCOUNTER — Encounter: Payer: Self-pay | Admitting: Family Medicine

## 2023-06-12 ENCOUNTER — Encounter: Payer: Self-pay | Admitting: Orthopedic Surgery

## 2023-06-13 ENCOUNTER — Telehealth: Payer: Self-pay | Admitting: Orthopedic Surgery

## 2023-06-13 NOTE — Telephone Encounter (Signed)
 Visit notes 11/2022 - present placed at the front desk to faxing.

## 2023-06-13 NOTE — Telephone Encounter (Signed)
 Pt called requesting medical release form be emailed to her. Please email medical release form to [email or web address]. Pt phone number is [phone].

## 2023-06-14 NOTE — Telephone Encounter (Signed)
 Auth emailed to patient.

## 2023-06-20 ENCOUNTER — Ambulatory Visit

## 2023-06-23 ENCOUNTER — Emergency Department (HOSPITAL_COMMUNITY)
Admission: EM | Admit: 2023-06-23 | Discharge: 2023-06-23 | Disposition: A | Discharge status: Home / Self Care | Attending: Emergency Medicine | Admitting: Emergency Medicine

## 2023-06-23 ENCOUNTER — Emergency Department (HOSPITAL_COMMUNITY)

## 2023-06-23 ENCOUNTER — Other Ambulatory Visit: Payer: Self-pay

## 2023-06-23 ENCOUNTER — Encounter (HOSPITAL_COMMUNITY): Payer: Self-pay

## 2023-06-23 DIAGNOSIS — I10 Essential (primary) hypertension: Secondary | ICD-10-CM | POA: Diagnosis not present

## 2023-06-23 DIAGNOSIS — Z720 Tobacco use: Secondary | ICD-10-CM | POA: Insufficient documentation

## 2023-06-23 DIAGNOSIS — R0781 Pleurodynia: Secondary | ICD-10-CM | POA: Insufficient documentation

## 2023-06-23 DIAGNOSIS — Z79899 Other long term (current) drug therapy: Secondary | ICD-10-CM | POA: Diagnosis not present

## 2023-06-23 LAB — BASIC METABOLIC PANEL WITH GFR
Anion gap: 8 (ref 5–15)
BUN: 12 mg/dL (ref 6–20)
CO2: 23 mmol/L (ref 22–32)
Calcium: 9.4 mg/dL (ref 8.9–10.3)
Chloride: 105 mmol/L (ref 98–111)
Creatinine, Ser: 0.61 mg/dL (ref 0.44–1.00)
GFR, Estimated: >60 mL/min (ref >60)
Glucose, Bld: 170 mg/dL — ABNORMAL HIGH (ref 70–99)
Potassium: 4.3 mmol/L (ref 3.5–5.1)
Sodium: 136 mmol/L (ref 135–145)

## 2023-06-23 LAB — CBC
HCT: 47.7 % — ABNORMAL HIGH (ref 36.0–46.0)
Hemoglobin: 15.9 g/dL — ABNORMAL HIGH (ref 12.0–15.0)
MCH: 30.3 pg (ref 26.0–34.0)
MCHC: 33.3 g/dL (ref 30.0–36.0)
MCV: 91 fL (ref 80.0–100.0)
Platelets: 291 10*3/uL (ref 150–400)
RBC: 5.24 MIL/uL — ABNORMAL HIGH (ref 3.87–5.11)
RDW: 13.4 % (ref 11.5–15.5)
WBC: 14.5 10*3/uL — ABNORMAL HIGH (ref 4.0–10.5)
nRBC: 0 % (ref 0.0–0.2)

## 2023-06-23 LAB — TROPONIN I (HIGH SENSITIVITY)
Troponin I (High Sensitivity): 3 ng/L (ref <18)
Troponin I (High Sensitivity): 4 ng/L (ref <18)

## 2023-06-23 LAB — D-DIMER, QUANTITATIVE: D-Dimer, Quant: <0.27 ug{FEU}/mL (ref 0.00–0.50)

## 2023-06-23 MED ORDER — IBUPROFEN 600 MG PO TABS: 600.0000 mg | ORAL_TABLET | Freq: Four times a day (QID) | ORAL | 0 refills | Status: AC | PRN

## 2023-06-23 MED ORDER — MORPHINE SULFATE (PF) 4 MG/ML IV SOLN
6.0000 mg | Freq: Once | INTRAVENOUS | Status: AC
  Administered 2023-06-23: 6 mg via INTRAVENOUS
  Filled 2023-06-23: qty 2

## 2023-06-23 MED ORDER — IOHEXOL 350 MG/ML SOLN
100.0000 mL | Freq: Once | INTRAVENOUS | Status: AC | PRN
  Administered 2023-06-23: 100 mL via INTRAVENOUS

## 2023-06-23 MED ORDER — KETOROLAC TROMETHAMINE 15 MG/ML IJ SOLN
15.0000 mg | Freq: Once | INTRAMUSCULAR | Status: AC
  Administered 2023-06-23: 15 mg via INTRAVENOUS
  Filled 2023-06-23: qty 1

## 2023-06-23 MED ORDER — ALBUTEROL SULFATE HFA 108 (90 BASE) MCG/ACT IN AERS
2.0000 | INHALATION_SPRAY | Freq: Four times a day (QID) | RESPIRATORY_TRACT | Status: DC | PRN
  Administered 2023-06-23: 2 via RESPIRATORY_TRACT
  Filled 2023-06-23: qty 6.7

## 2023-06-23 NOTE — ED Provider Notes (Signed)
 [place] EMERGENCY DEPARTMENT AT [facility] Provider Note   CSN: [id number] Arrival date & time: 06/23/23  1806     History  Chief Complaint  Patient presents with   Chest Pain   Shortness of Breath    Brooke Kaiser is a 53 y.o. female.  HPI    53 year old female comes in with chief complaint of chest pain and shortness of breath.  Patient has history of tobacco use disorder and hypertension.  Patient states that she woke up with chest pain and shortness of breath today.  Chest pain is described as constant, sharp pain that is worse with deep inspiration.  When patient takes a deep breath, the pain will radiate to her jaw and neck.  Patient also has had pain radiating to her back.  She denies any cocaine use. Pt has no hx of PE, DVT and denies any exogenous hormone (testosterone / estrogen) use, long distance travels or surgery in the past 6 weeks, active cancer, recent immobilization.   Home Medications Prior to Admission medications   Medication Sig Start Date End Date Taking? Authorizing Provider  albuterol  (VENTOLIN  HFA) 108 (90 Base) MCG/ACT inhaler INHALE 2-4 PUFFS INTO THE LUNGS 2 TIMES DAILY AS NEEDED. Patient taking differently: Inhale 2-4 puffs into the lungs every 6 (six) hours as needed for wheezing or shortness of breath. INHALE 2-4 PUFFS INTO THE LUNGS 2 TIMES DAILY AS NEEDED. 07/25/21  Yes Cydney Draft, MD  ibuprofen  (ADVIL ) 600 MG tablet Take 1 tablet (600 mg total) by mouth every 6 (six) hours as needed. 06/23/23  Yes Deatra Face, MD  lamoTRIgine  (LAMICTAL ) 100 MG tablet Take 1 tablet (100 mg total) by mouth daily. Patient taking differently: Take 100 mg by mouth as directed. Take along with 25 mg tablet=125 mg 11/28/22  Yes Wray Heady, MD  lamoTRIgine  (LAMICTAL ) 25 MG tablet Take 1 tablet (25 mg total) by mouth daily. Patient taking differently: Take 25 mg by mouth as directed. Take along with 100 mg tablet=125 mg 02/11/23 02/11/24 Yes  Wray Heady, MD  lidocaine  (LMX) 4 % cream Apply 1 Application topically as needed (applies patch to lower back as needed, OTC).   Yes [provider]  losartan  (COZAAR ) 100 MG tablet TAKE 1 TABLET BY MOUTH EVERY DAY 02/22/23  Yes Cydney Draft, MD  venlafaxine  XR (EFFEXOR -XR) 75 MG 24 hr capsule Take 3 capsules (225 mg total) by mouth daily with breakfast. 05/15/23  Yes Wray Heady, MD  Vitamin D -Vitamin K (VITAMIN K2-VITAMIN D3 PO) Take 1 tablet by mouth daily.   Yes [provider]  metFORMIN  (GLUCOPHAGE -XR) 500 MG 24 hr tablet Take 1 tablet (500 mg total) by mouth daily with breakfast. Patient not taking: Reported on 05/08/2023 04/19/23   Cydney Draft, MD      Allergies    Clavulanic acid, [place]  [trospium ], and Tape    Review of Systems   Review of Systems  All other systems reviewed and are negative.   Physical Exam Updated Vital Signs BP (!) 155/68   Pulse 85   Temp 98.5 F (36.9 C)   Resp (!) 23   LMP 06/24/2013   SpO2 92%  Physical Exam Vitals and nursing note reviewed.  Constitutional:      Appearance: She is well-developed.  HENT:     Head: Atraumatic.  Cardiovascular:     Rate and Rhythm: Normal rate.     Pulses:          Radial  pulses are 2+ on the right side and 2+ on the left side.     Heart sounds: Normal heart sounds.  Pulmonary:     Effort: Pulmonary effort is normal.  Musculoskeletal:     Cervical back: Normal range of motion and neck supple.     Right lower leg: No tenderness. No edema.     Left lower leg: No tenderness. No edema.  Skin:    General: Skin is warm and dry.  Neurological:     Mental Status: She is alert and oriented to person, place, and time.     ED Results / Procedures / Treatments   Labs (all labs ordered are listed, but only abnormal results are displayed) Labs Reviewed  BASIC METABOLIC PANEL WITH GFR - Abnormal; Notable for the following components:      Result Value   Glucose, Bld 170  (*)    All other components within normal limits  CBC - Abnormal; Notable for the following components:   WBC 14.5 (*)    RBC 5.24 (*)    Hemoglobin 15.9 (*)    HCT 47.7 (*)    All other components within normal limits  D-DIMER, QUANTITATIVE  TROPONIN I (HIGH SENSITIVITY)  TROPONIN I (HIGH SENSITIVITY)    EKG EKG Interpretation Date/Time:  Sunday Jun 23 2023 18:14:03 EDT Ventricular Rate:  83 PR Interval:  147 QRS Duration:  98 QT Interval:  375 QTC Calculation: 441 R Axis:   38  Text Interpretation: Sinus rhythm No acute changes No significant change since last tracing Confirmed by Deatra Face [phone]) on 06/23/2023 7:25:49 PM  Radiology CT Angio Chest/Abd/Pel for Dissection W and/or Wo Contrast Result Date: 06/23/2023 CLINICAL DATA:  Acute aortic syndrome suspected. Chest pain radiating to back. Forty pack year smoking history. Shortness of breath EXAM: CT ANGIOGRAPHY CHEST, ABDOMEN AND PELVIS TECHNIQUE: Non-contrast CT of the chest was initially obtained. Multidetector CT imaging through the chest, abdomen and pelvis was performed using the standard protocol during bolus administration of intravenous contrast. Multiplanar reconstructed images and MIPs were obtained and reviewed to evaluate the vascular anatomy. RADIATION DOSE REDUCTION: This exam was performed according to the departmental dose-optimization program which includes automated exposure control, adjustment of the mA and/or kV according to patient size and/or use of iterative reconstruction technique. CONTRAST:  OMNIPAQUE IOHEXOL 350 MG/ML SOLN COMPARISON:  Same day chest radiograph and cardiac CT 06/08/2022 FINDINGS: CTA CHEST FINDINGS Cardiovascular: Normal heart size. No pericardial effusion. Coronary artery and aortic atherosclerotic calcification. Normal caliber thoracic aorta without penetrating atherosclerotic ulcer, intramural hematoma, or dissection. Lipomatous hypertrophy of the intra-atrial septum. No  central pulmonary embolism. Mediastinum/Nodes: Trachea and esophagus are unremarkable. Mediastinal and hilar lymphadenopathy. For example precarinal node on series 11/image 46 measuring 1.2 cm 1.3 cm right hilar node on series 11/56. Lungs/Pleura: Mild paraseptal emphysema. No focal consolidation, pleural effusion, or pneumothorax. Scarring in the lower lungs. Musculoskeletal: No acute fracture. Incidental pleural lipoma abutting the right middle lobe. Review of the MIP images confirms the above findings. CTA ABDOMEN AND PELVIS FINDINGS VASCULAR Mixed density atherosclerotic plaque in the aorta without aneurysm or dissection. The mesenteric, renal, and iliac artery branches are patent without hemodynamically significant stenosis, aneurysm or dissection. Review of the MIP images confirms the above findings. NON-VASCULAR Hepatobiliary: Hepatic steatosis. Gallbladder and biliary tree are unremarkable. Pancreas: Unremarkable. Spleen: Unremarkable. Adrenals/Urinary Tract: Normal adrenal glands. No urinary calculi or hydronephrosis. Unremarkable bladder. Stomach/Bowel: Normal caliber large and small bowel. Stomach and appendix are within normal  limits. No bowel wall thickening. Lymphatic: No lymphadenopathy. Reproductive: 3.3 cm cyst in the left ovary. This was evaluated with ultrasound 07/03/2021 and is not substantially changed given differences in technique. Calcified fibroids in the uterus. Right oophorectomy. Other: No free intraperitoneal fluid or air. Musculoskeletal: No acute fracture. Review of the MIP images confirms the above findings. IMPRESSION: 1. No acute aortic syndrome. 2. Mediastinal and hilar lymphadenopathy, nonspecific. 3. Hepatic steatosis. 4. 3.3 cm cyst in the left ovary. This was evaluated with ultrasound 07/03/2021 and is not substantially changed. Aortic Atherosclerosis (ICD10-I70.0) and Emphysema (ICD10-J43.9). Electronically Signed   By: Rozell Cornet M.D.   On: 06/23/2023 22:08   DG Chest  2 View Result Date: 06/23/2023 CLINICAL DATA:  Chest pain or shortness of breath EXAM: CHEST - 2 VIEW COMPARISON:  11/21/2021 FINDINGS: The heart size and mediastinal contours are within normal limits. Both lungs are clear. The visualized skeletal structures are unremarkable. IMPRESSION: No active cardiopulmonary disease. Electronically Signed   By: Rozell Cornet M.D.   On: 06/23/2023 18:48    Procedures Procedures    Medications Ordered in ED Medications  albuterol  (VENTOLIN  HFA) 108 (90 Base) MCG/ACT inhaler 2 puff (has no administration in time range)  morphine (PF) 4 MG/ML injection 6 mg (6 mg Intravenous Given 06/23/23 2026)  iohexol (OMNIPAQUE) 350 MG/ML injection 100 mL (100 mLs Intravenous Contrast Given 06/23/23 2126)  ketorolac  (TORADOL ) 15 MG/ML injection 15 mg (15 mg Intravenous Given 06/23/23 2247)    ED Course/ Medical Decision Making/ A&P                                 Medical Decision Making Amount and/or Complexity of Data Reviewed Labs: ordered. Radiology: ordered.  Risk Prescription drug management.   This patient presents to the ED with chief complaint(s) of chest pain that is radiating to the back, shortness of breath with pertinent past medical history of hypertension, extensive tobacco use disease history.  Patient currently smokes a pack and 1/2 to 2 packs a day, she has been doing that for about 3 to 5 years and previously was smoking pack a day since age 43.The complaint involves an extensive differential diagnosis and also carries with it a high risk of complications and morbidity.    The differential diagnosis considered for this patient includes  ACS syndrome Aortic dissection CHF exacerbation Valvular disorder Myocarditis Pericarditis Endocarditis Pericardial effusion / tamponade Pneumonia Pleural effusion / Pulmonary edema PE Pneumothorax Musculoskeletal pain PUD / Gastritis / Esophagitis Esophageal spasm  The initial plan is to get basic  labs. Although the pain is pleuritic, it is radiating to the back, she is tachycardic, slightly hypertensive and has extensive tobacco use disorder history.  I will get CT dissection study.  D-dimer ordered to evaluate for clots.  If D-dimer negative, then no DVT study will be ordered.   Independent labs interpretation:  The following labs were independently interpreted: Patient's troponin x 2 are normal. D-dimer ordered and also normal. CBC, BMP is normal.  Independent visualization and interpretation of imaging: - I independently visualized the following imaging with scope of interpretation limited to determining acute life threatening conditions related to emergency care: CT scan of the chest, which revealed no evidence of dissection.  Treatment and Reassessment: The patient appears reasonably screened and/or stabilized for discharge and I doubt any other medical condition or other [facility] requiring further screening, evaluation, or treatment in the ED at  this time prior to discharge.   Results from the ER workup discussed with the patient face to face and all questions answered to the best of my ability. The patient is safe for discharge with strict return precautions.   Final Clinical Impression(s) / ED Diagnoses Final diagnoses:  Pleuritic chest pain    Rx / DC Orders ED Discharge Orders          Ordered    ibuprofen  (ADVIL ) 600 MG tablet  Every 6 hours PRN        06/23/23 2250              Deatra Face, MD 06/23/23 2256

## 2023-06-23 NOTE — ED Triage Notes (Signed)
 PT arrives via POV. PT reports waking up with chest pain and sob. States it has been constant and a stabbing pain. Pt reports she has also had a cough for the past 6 months. Pt is AxOx4.

## 2023-06-23 NOTE — Discharge Instructions (Addendum)
 You were seen in the ER for chest pain.  The workup in the emergency room is reassuring.  There is no evidence of heart injury, vascular injury of your aorta or blood clot in the lung.  Please take the medications prescribed for symptom management.  Albuterol  can be taken every 4-6 hours if you have chest tightness or shortness of breath.  We recommend calling your PCP for a follow-up in 1 week.  Please return to the ER if you have worsening chest pain, shortness of breath, pain radiating to your jaw, shoulder, or back, sweats or fainting.

## 2023-07-03 ENCOUNTER — Ambulatory Visit

## 2023-07-09 ENCOUNTER — Other Ambulatory Visit: Payer: Self-pay | Admitting: Family Medicine

## 2023-07-09 ENCOUNTER — Ambulatory Visit: Admitting: Orthopedic Surgery

## 2023-07-09 DIAGNOSIS — M65331 Trigger finger, right middle finger: Secondary | ICD-10-CM

## 2023-07-09 NOTE — Progress Notes (Signed)
   Brooke Kaiser - 53 y.o. female MRN [id number]  Date of birth: 1970/08/26  Office Visit Note: Visit Date: 07/09/2023 PCP: Cydney Draft, MD Referred by: Cydney Draft, *  Subjective:  HPI: Brooke Kaiser is a 53 y.o. female who presents today for follow up 17 weeks status post right long finger trigger digit release.  She is doing very well overall, has resumed activity as tolerated with the right hand.  Does have some residual stiffness at the PIP which continues to improve.  Pertinent ROS were reviewed with the patient and found to be negative unless otherwise specified above in HPI.   Assessment & Plan: Visit Diagnoses:  1. Trigger finger, right middle finger     Plan: She continues to do very well postoperatively.  I did once again explained that given the chronicity of her initial trigger digit, with the associated PIP contracture, this will take extensive time for complete resolution.  I did explain that there could be an element of slight PIP contracture that does persist however she does demonstrate appropriate improvement with this in comparison to preoperative examination.  There is no residual clicking or locking.  Continue with activity as tolerated, follow-up as needed moving forward.  She expressed full understanding.  Follow-up: No follow-ups on file.   Meds & Orders: No orders of the defined types were placed in this encounter.  No orders of the defined types were placed in this encounter.    Procedures: No procedures performed       Objective:   Vital Signs: LMP 06/24/2013   Ortho Exam Right hand: - Well-healed incision at the base of the long finger, skin is well-approximated, no erythema or drainage - Able to perform full digital range of motion without residual clicking or locking, composite fist - Sensation intact distally, hand is warm well-perfused - PIP held in approximately 5 degrees of flexion at rest, improved passively to near  neutral   Imaging: No results found.   Brooke Kaiser, M.D. [facility], Hand Surgery

## 2023-08-06 ENCOUNTER — Telehealth: Payer: Self-pay | Admitting: Family Medicine

## 2023-08-06 DIAGNOSIS — K760 Fatty (change of) liver, not elsewhere classified: Secondary | ICD-10-CM

## 2023-08-06 NOTE — Telephone Encounter (Signed)
 Patient dropped off [employer] forms to be completed and faxed to [employer] at [phone]. Forms put in [facility] box.

## 2023-08-06 NOTE — Progress Notes (Deleted)
 Brooke Kaiser, female    DOB: 02/24/1970   MRN: [id number]   Brief patient profile:  56 yowf  *** referred to pulmonary clinic 08/08/2023 by *** for ***      Pt not previously seen by PCCM service.    History of Present Illness  08/08/2023  Pulmonary/ 1st office eval/Jissel Slavens  No chief complaint on file.    Dyspnea:  *** Cough: *** Sleep: *** SABA use: *** 02 use:*** LDSCT:***  No obvious day to day or daytime pattern/variability or assoc excess/ purulent sputum or mucus plugs or hemoptysis or cp or chest tightness, subjective wheeze or overt sinus or hb symptoms.    Also denies any obvious fluctuation of symptoms with weather or environmental changes or other aggravating or alleviating factors except as outlined above   No unusual exposure hx or h/o childhood pna/ asthma or knowledge of premature birth.  Current Allergies, Complete Past Medical History, Past Surgical History, Family History, and Social History were reviewed in [employer] record.  ROS  The following are not active complaints unless bolded Hoarseness, sore throat, dysphagia, dental problems, itching, sneezing,  nasal congestion or discharge of excess mucus or purulent secretions, ear ache,   fever, chills, sweats, unintended wt loss or wt gain, classically pleuritic or exertional cp,  orthopnea pnd or arm/hand swelling  or leg swelling, presyncope, palpitations, abdominal pain, anorexia, nausea, vomiting, diarrhea  or change in bowel habits or change in bladder habits, change in stools or change in urine, dysuria, hematuria,  rash, arthralgias, visual complaints, headache, numbness, weakness or ataxia or problems with walking or coordination,  change in mood or  memory.             Outpatient Medications Prior to Visit  Medication Sig Dispense Refill   albuterol  (VENTOLIN  HFA) 108 (90 Base) MCG/ACT inhaler Inhale 2-4 puffs into the lungs every 6 (six) hours as needed for wheezing or shortness  of breath. INHALE 2-4 PUFFS INTO THE LUNGS 2 TIMES DAILY AS NEEDED. 18 g 1   ibuprofen  (ADVIL ) 600 MG tablet Take 1 tablet (600 mg total) by mouth every 6 (six) hours as needed. 30 tablet 0   lamoTRIgine  (LAMICTAL ) 100 MG tablet Take 1 tablet (100 mg total) by mouth daily. (Patient taking differently: Take 100 mg by mouth as directed. Take along with 25 mg tablet=125 mg) 90 tablet 1   lamoTRIgine  (LAMICTAL ) 25 MG tablet Take 1 tablet (25 mg total) by mouth daily. (Patient taking differently: Take 25 mg by mouth as directed. Take along with 100 mg tablet=125 mg) 90 tablet 0   lidocaine  (LMX) 4 % cream Apply 1 Application topically as needed (applies patch to lower back as needed, OTC).     losartan  (COZAAR ) 100 MG tablet TAKE 1 TABLET BY MOUTH EVERY DAY 90 tablet 1   metFORMIN  (GLUCOPHAGE -XR) 500 MG 24 hr tablet Take 1 tablet (500 mg total) by mouth daily with breakfast. (Patient not taking: Reported on 05/08/2023) 90 tablet 1   venlafaxine  XR (EFFEXOR -XR) 75 MG 24 hr capsule Take 3 capsules (225 mg total) by mouth daily with breakfast. 270 capsule 0   Vitamin D -Vitamin K (VITAMIN K2-VITAMIN D3 PO) Take 1 tablet by mouth daily.     No facility-administered medications prior to visit.    Past Medical History:  Diagnosis Date   Abnormal thyroid  blood test    Anemia    Anxiety    Asthma    Depression    GERD (  gastroesophageal reflux disease)    Hypertension    Tobacco abuse       Objective:     LMP 06/24/2013          Assessment   No problem-specific Assessment & Plan notes found for this encounter.     Ozell America, MD 08/06/2023

## 2023-08-08 ENCOUNTER — Ambulatory Visit: Admitting: Internal Medicine

## 2023-08-08 ENCOUNTER — Encounter: Payer: Self-pay | Admitting: Internal Medicine

## 2023-08-12 ENCOUNTER — Telehealth (HOSPITAL_COMMUNITY): Payer: Medicaid Other | Admitting: Psychiatry

## 2023-08-12 ENCOUNTER — Encounter: Payer: Self-pay | Admitting: Family Medicine

## 2023-08-12 DIAGNOSIS — K760 Fatty (change of) liver, not elsewhere classified: Secondary | ICD-10-CM | POA: Insufficient documentation

## 2023-08-12 NOTE — Telephone Encounter (Signed)
 Forwarding to Dr. Denyse Amass to review and advise.

## 2023-08-14 NOTE — Telephone Encounter (Signed)
 Forms faxed to [employer] confirmation received, scanned and placed in pt's chart.

## 2023-08-15 ENCOUNTER — Other Ambulatory Visit: Payer: Self-pay | Admitting: Family Medicine

## 2023-08-15 ENCOUNTER — Other Ambulatory Visit (HOSPITAL_COMMUNITY): Payer: Self-pay | Admitting: Psychiatry

## 2023-08-16 ENCOUNTER — Other Ambulatory Visit (HOSPITAL_COMMUNITY): Payer: Self-pay | Admitting: Psychiatry

## 2023-10-15 ENCOUNTER — Other Ambulatory Visit: Payer: Self-pay | Admitting: Family Medicine

## 2023-10-22 ENCOUNTER — Encounter: Payer: Self-pay | Admitting: Family Medicine

## 2023-10-22 ENCOUNTER — Ambulatory Visit (INDEPENDENT_AMBULATORY_CARE_PROVIDER_SITE_OTHER): Admitting: Family Medicine

## 2023-10-22 VITALS — BP 133/70 | HR 79 | Resp 18 | Ht 68.0 in | Wt 255.8 lb

## 2023-10-22 DIAGNOSIS — M2550 Pain in unspecified joint: Secondary | ICD-10-CM | POA: Diagnosis not present

## 2023-10-22 DIAGNOSIS — F332 Major depressive disorder, recurrent severe without psychotic features: Secondary | ICD-10-CM

## 2023-10-22 DIAGNOSIS — J4520 Mild intermittent asthma, uncomplicated: Secondary | ICD-10-CM

## 2023-10-22 DIAGNOSIS — Z23 Encounter for immunization: Secondary | ICD-10-CM | POA: Diagnosis not present

## 2023-10-22 DIAGNOSIS — F17200 Nicotine dependence, unspecified, uncomplicated: Secondary | ICD-10-CM

## 2023-10-22 DIAGNOSIS — I10 Essential (primary) hypertension: Secondary | ICD-10-CM | POA: Diagnosis not present

## 2023-10-22 DIAGNOSIS — E1165 Type 2 diabetes mellitus with hyperglycemia: Secondary | ICD-10-CM | POA: Diagnosis not present

## 2023-10-22 DIAGNOSIS — R0981 Nasal congestion: Secondary | ICD-10-CM

## 2023-10-22 DIAGNOSIS — K760 Fatty (change of) liver, not elsewhere classified: Secondary | ICD-10-CM

## 2023-10-22 DIAGNOSIS — M109 Gout, unspecified: Secondary | ICD-10-CM

## 2023-10-22 LAB — POCT GLYCOSYLATED HEMOGLOBIN (HGB A1C): Hemoglobin A1C: 7 % — AB (ref 4.0–5.6)

## 2023-10-22 MED ORDER — LOSARTAN POTASSIUM 100 MG PO TABS: 100.0000 mg | ORAL_TABLET | Freq: Every day | ORAL | 1 refills | Status: AC

## 2023-10-22 MED ORDER — METFORMIN HCL ER 500 MG PO TB24: 500.0000 mg | ORAL_TABLET | Freq: Two times a day (BID) | ORAL | 1 refills | Status: AC

## 2023-10-22 NOTE — Assessment & Plan Note (Signed)
 BP borderline elevated.

## 2023-10-22 NOTE — Assessment & Plan Note (Signed)
 Encouraged getting up to date Pneumonia vaccines.

## 2023-10-22 NOTE — Assessment & Plan Note (Addendum)
 Follows with Dr. Geralene.  On Effexor  and Lamictal .

## 2023-10-22 NOTE — Progress Notes (Signed)
 Established Patient Office Visit  Subjective  Patient ID: Brooke Kaiser, female    DOB: 08/25/1970  Age: 53 y.o. MRN: [id number]  Chief Complaint  Patient presents with   Hypertension    Pt would like to have blood work done to check for autoimmune disorders she states she has intermittent stinging and burning and random spot body aches     HPI Discussed the use of AI scribe software for clinical note transcription with the patient, who gave verbal consent to proceed.  History of Present Illness Brooke Kaiser is a 53 year old female with chronic sinus issues and diabetes who presents for follow-up and management of her symptoms.  Chronic sinonasal symptoms - Ongoing sinus congestion, stuffiness, and drainage for the past year - Partial relief with Sudafed - Previous use of Flonase  and Nasonex  with varying success  Intermittent back pain and cutaneous sensitivity - Intermittent back pain with episodes of sensitive skin pain - Pain described as coming and going, resembling flu-like symptoms without fever - Received two back injections since last visit  Diabetes mellitus management - Taking metformin  regularly for the past three months - Did not take metformin  on the day of visit due to fasting for blood work - Dietary modifications include avoiding buns with hamburgers and hot dogs - Continues to consume high fat intake, specifically two gallons of half and half per week in coffee  Gout and peripheral arthralgia - History of gout with increased pain in toes - Inquires about testing for autoimmune diseases due to past markers indicating potential rheumatoid arthritis  Dyspnea and anxiety symptoms - Occasional shortness of breath and anxiety-related symptoms - No chest pain  Dietary habits and cardiovascular health - Drinks pomegranate juice for perceived heart health benefits despite high sugar content - Switched to unsweetened tea to reduce sugar intake  Flowsheet Row  Office Visit from 10/22/2023 in [facility] at [facility]  PHQ-9 Total Score 15      10/22/2023    2:20 PM 04/18/2023   10:46 AM 01/10/2022   10:28 AM 03/04/2018   10:18 AM  GAD 7 : Generalized Anxiety Score  Nervous, Anxious, on Edge 1 0 1 2  Control/stop worrying 2 0 0 1  Worry too much - different things 2 1 1 1   Trouble relaxing 2 1 1 1   Restless 1 0 0 1  Easily annoyed or irritable 2 0 1 2  Afraid - awful might happen 0 0 0 2  Total GAD 7 Score 10 2 4 10   Anxiety Difficulty  Not difficult at all Not difficult at all Somewhat difficult        ROS    Objective:     BP 133/70   Pulse 79   Resp 18   Ht 5' 8 (1.727 m)   Wt 255 lb 12 oz (116 kg)   LMP 06/24/2013   SpO2 99%   BMI 38.89 kg/m    Physical Exam Vitals and nursing note reviewed.  Constitutional:      Appearance: Normal appearance.  HENT:     Head: Normocephalic and atraumatic.  Eyes:     Conjunctiva/sclera: Conjunctivae normal.  Cardiovascular:     Rate and Rhythm: Normal rate and regular rhythm.  Pulmonary:     Effort: Pulmonary effort is normal.     Breath sounds: Normal breath sounds.  Skin:    General: Skin is warm and dry.  Neurological:  Mental Status: She is alert.  Psychiatric:        Mood and Affect: Mood normal.      Results for orders placed or performed in visit on 10/22/23  POCT HgB A1C  Result Value Ref Range   Hemoglobin A1C 7.0 (A) 4.0 - 5.6 %   HbA1c POC (<> result, manual entry)     HbA1c, POC (prediabetic range)     HbA1c, POC (controlled diabetic range)        The 10-year ASCVD risk score (Arnett DK, et al., 2019) is: 19.2%    Assessment & Plan:   Problem List Items Addressed This Visit       Cardiovascular and Mediastinum   Essential hypertension   BP borderline elevated.        Relevant Medications   losartan  (COZAAR ) 100 MG tablet   Other Relevant Orders   Urine Microalbumin w/creat. ratio    CMP14+EGFR   Lipid panel   CBC   Sedimentation rate   C-reactive protein   Uric acid     Respiratory   Asthmatic bronchitis   Encouraged getting up to date Pneumonia vaccines.         Digestive   Hepatic steatosis   Relevant Orders   Urine Microalbumin w/creat. ratio   CMP14+EGFR   Lipid panel   CBC   Sedimentation rate   C-reactive protein   Uric acid     Endocrine   Type 2 diabetes mellitus with hyperglycemia (HCC)   Type 2 diabetes mellitus Managed with metformin  for three months. Dietary changes made, but high fat intake from half and half remains a challenge. - Order A1c test. - Encourage continued dietary modifications, including reducing half and half intake. - Consider switching to whole milk or mixing whole milk with half and half to reduce fat intake. - increase metformin  to 2 tabs daily.   - Given Pneumonia vaccines. Declined flu vaccine.  - Urine micro collected today.   - Reminded due for eye exam.       Relevant Medications   losartan  (COZAAR ) 100 MG tablet   metFORMIN  (GLUCOPHAGE -XR) 500 MG 24 hr tablet   Other Relevant Orders   POCT HgB A1C (Completed)   Urine Microalbumin w/creat. ratio   CMP14+EGFR   Lipid panel   CBC   Sedimentation rate   C-reactive protein   Uric acid     Musculoskeletal and Integument   Gouty arthritis   Gout, left foot Increased pain in toes. Discussed checking uric acid levels to assess risk for future flares. - Order uric acid level.      Relevant Orders   Urine Microalbumin w/creat. ratio   CMP14+EGFR   Lipid panel   CBC   Sedimentation rate   C-reactive protein   Uric acid     Other   TOBACCO ABUSE   Severe episode of recurrent major depressive disorder (HCC)   Follows with Dr. Geralene.  On Effexor  and Lamictal .        Other Visit Diagnoses       Arthralgia, unspecified joint    -  Primary   Relevant Orders   Urine Microalbumin w/creat. ratio   CMP14+EGFR   Lipid panel   CBC   Sedimentation  rate   C-reactive protein   Uric acid     Sinus congestion         Immunization due       Relevant Orders   Pneumococcal conjugate vaccine 20-valent (Prevnar 20) (Completed)  Assessment and Plan Assessment & Plan Hypertriglyceridemia and elevated LDL cholesterol Potentially related to high intake of half and half. She is aware of the need to reduce saturated fats. - Order cholesterol panel. - Encourage reduction of half and half intake. Drinks 2 gallons a week.  - Suggest increasing intake of healthy fats such as nuts, avocados, and fish.  Elevated ALT Previous elevated ALT levels. - Order liver function tests.  Chronic sinus symptoms Chronic symptoms for one year, relief from Sudafed. Discussed ENT referral and allergy testing if symptoms persist. - Consider ENT referral if symptoms persist. - Discuss potential for allergy testing if structural issues are ruled out.  Chronic pain with intermittent sensitive skin and headaches Discussed potential autoimmune causes and inflammatory markers. No specific joint swelling or pattern suggestive of a particular autoimmune disorder. - Order inflammatory markers (sed rate and CRP).    Return in about 6 months (around 04/20/2024) for Diabetes follow-up, liver enzymes.    Dorothyann Byars, MD

## 2023-10-22 NOTE — Patient Instructions (Signed)
Increase metformin to 2 tabs daily.

## 2023-10-22 NOTE — Assessment & Plan Note (Addendum)
 Type 2 diabetes mellitus Managed with metformin  for three months. Dietary changes made, but high fat intake from half and half remains a challenge. - Order A1c test. - Encourage continued dietary modifications, including reducing half and half intake. - Consider switching to whole milk or mixing whole milk with half and half to reduce fat intake. - increase metformin  to 2 tabs daily.   - Given Pneumonia vaccines. Declined flu vaccine.  - Urine micro collected today.   - Reminded due for eye exam.

## 2023-10-22 NOTE — Assessment & Plan Note (Signed)
 Gout, left foot Increased pain in toes. Discussed checking uric acid levels to assess risk for future flares. - Order uric acid level.

## 2023-10-23 ENCOUNTER — Ambulatory Visit: Payer: Self-pay | Admitting: Family Medicine

## 2023-10-23 LAB — LIPID PANEL
Chol/HDL Ratio: 5.5 ratio — ABNORMAL HIGH (ref 0.0–4.4)
Cholesterol, Total: 188 mg/dL (ref 100–199)
HDL: 34 mg/dL — ABNORMAL LOW (ref >39)
LDL Chol Calc (NIH): 109 mg/dL — ABNORMAL HIGH (ref 0–99)
Triglycerides: 260 mg/dL — ABNORMAL HIGH (ref 0–149)
VLDL Cholesterol Cal: 45 mg/dL — ABNORMAL HIGH (ref 5–40)

## 2023-10-23 LAB — CBC
Hematocrit: 47.5 % — ABNORMAL HIGH (ref 34.0–46.6)
Hemoglobin: 16.2 g/dL — ABNORMAL HIGH (ref 11.1–15.9)
MCH: 30.9 pg (ref 26.6–33.0)
MCHC: 34.1 g/dL (ref 31.5–35.7)
MCV: 91 fL (ref 79–97)
Platelets: 291 x10E3/uL (ref 150–450)
RBC: 5.25 x10E6/uL (ref 3.77–5.28)
RDW: 13 % (ref 11.7–15.4)
WBC: 10.9 x10E3/uL — ABNORMAL HIGH (ref 3.4–10.8)

## 2023-10-23 LAB — CMP14+EGFR
ALT: 78 IU/L — ABNORMAL HIGH (ref 0–32)
AST: 42 IU/L — ABNORMAL HIGH (ref 0–40)
Albumin: 4.3 g/dL (ref 3.8–4.9)
Alkaline Phosphatase: 65 IU/L (ref 49–135)
BUN/Creatinine Ratio: 13 (ref 9–23)
BUN: 9 mg/dL (ref 6–24)
Bilirubin Total: 0.5 mg/dL (ref 0.0–1.2)
CO2: 21 mmol/L (ref 20–29)
Calcium: 9.4 mg/dL (ref 8.7–10.2)
Chloride: 102 mmol/L (ref 96–106)
Creatinine, Ser: 0.69 mg/dL (ref 0.57–1.00)
Globulin, Total: 2.4 g/dL (ref 1.5–4.5)
Glucose: 159 mg/dL — ABNORMAL HIGH (ref 70–99)
Potassium: 4.5 mmol/L (ref 3.5–5.2)
Sodium: 138 mmol/L (ref 134–144)
Total Protein: 6.7 g/dL (ref 6.0–8.5)
eGFR: 104 mL/min/1.73 (ref >59)

## 2023-10-23 LAB — MICROALBUMIN / CREATININE URINE RATIO
Creatinine, Urine: 171.4 mg/dL
Microalb/Creat Ratio: 5 mg/g{creat} (ref 0–29)
Microalbumin, Urine: 8.4 ug/mL

## 2023-10-23 LAB — SEDIMENTATION RATE: Sed Rate: 23 mm/h (ref 0–40)

## 2023-10-23 LAB — URIC ACID: Uric Acid: 5.6 mg/dL (ref 3.0–7.2)

## 2023-10-23 LAB — C-REACTIVE PROTEIN: CRP: 4 mg/L (ref 0–10)

## 2023-10-23 NOTE — Progress Notes (Signed)
 Lashane -liver function is compared to 6 months ago the ALT has trended upward and the ALT is slightly elevated as well.  I do think part of this is related to the elevated glucose levels so I do want you to increase your metformin  to twice a day to try to bring down the sugar levels continue to work on decreasing that half-and-half creamer.  Total cholesterol and LDL is still elevated.  Hemoglobin still mildly elevated.  That comes from the smoking so encouraged her to continue to work on cutting back on that as well.  Donating blood can also be helpful.  Uric acid looks good and one of the inflammatory markers is back in its normal.

## 2023-10-23 NOTE — Progress Notes (Signed)
 2nd imflammatory marker is normal

## 2023-11-07 ENCOUNTER — Ambulatory Visit (INDEPENDENT_AMBULATORY_CARE_PROVIDER_SITE_OTHER): Admitting: Family Medicine

## 2023-11-07 ENCOUNTER — Ambulatory Visit

## 2023-11-07 ENCOUNTER — Encounter: Payer: Self-pay | Admitting: Family Medicine

## 2023-11-07 VITALS — BP 130/80 | HR 86 | Ht 68.0 in | Wt 255.0 lb

## 2023-11-07 DIAGNOSIS — M25552 Pain in left hip: Secondary | ICD-10-CM

## 2023-11-07 NOTE — Patient Instructions (Addendum)
 Thank you for coming in today.   Please get an Xray today before you leave   I've referred you to Physical Therapy.  Let us know if you don't hear from them in one week.   Check back in 2 months

## 2023-11-07 NOTE — Progress Notes (Signed)
   I, Brooke Kaiser, CMA acting as a scribe for Artist Lloyd, MD.  Brooke Kaiser is a 53 y.o. female who presents to [facility] at [facility] today for L hip pain. Pt was previously seen by Dr. Lloyd on 05/08/23 for LBP.  Today, pt c/o L hip pain x 6 months, intermittent, worsening over the past 2 months. . She notes hip will feel weak when she is walking. Pt locates pain to anterior aspect of the hip radiating into the . Feels like the hip is weak and will give out at times, denies fall.   Radiates: B LE - related to LBP Aggravates: ambulation, pivoting, IR, ER.  Treatments tried: none - using Lidocaine  for lower back.   Dx imaging: 01/21/22 L-spine MRI  Pertinent review of systems: No fevers or chills  Relevant historical information: Chronic back pain.  In the process of being evaluated for surgery after insufficient benefit with back procedures with pain management.  Hypertension and diabetes.   Exam:  BP 130/80   Pulse 86   Ht 5' 8 (1.727 m)   Wt 255 lb (115.7 kg)   LMP 06/24/2013   SpO2 96%   BMI 38.77 kg/m  General: Well Developed, well nourished, and in no acute distress.   MSK: Left hip normal-appearing Normal motion. Strength mildly reduced abduction and external rotation.  Flexion strength is intact.    Lab and Radiology Results  X-ray images left hip obtained today personally and independently interpreted. Minimal degenerative changes.  No acute fractures. Await formal radiology review     Assessment and Plan: 53 y.o. female with left anterior hip/groin pain.  Labrum dysfunction or tear is a possibility.  She does have mechanical symptoms.  Hip flexor or rotator tendinopathy is a possibility.  Plan to refer to physical therapy.  If not improved and next step would be either MRI arthrogram or trial of injection.   PDMP not reviewed this encounter. Orders Placed This Encounter  Procedures   DG HIP UNILAT W OR W/O PELVIS 2-3 VIEWS LEFT     Standing Status:   Future    Number of Occurrences:   1    Expiration Date:   12/08/2023    Reason for Exam (SYMPTOM  OR DIAGNOSIS REQUIRED):   left hip pain    Preferred imaging location?:   [facility]    Is patient pregnant?:   No   Ambulatory referral to Physical Therapy    Referral Priority:   Routine    Referral Type:   Physical Medicine    Referral Reason:   Specialty Services Required    Requested Specialty:   Physical Therapy    Number of Visits Requested:   1   No orders of the defined types were placed in this encounter.    Discussed warning signs or symptoms. Please see discharge instructions. Patient expresses understanding.   The above documentation has been reviewed and is accurate and complete Artist Lloyd, M.D.

## 2023-11-13 ENCOUNTER — Ambulatory Visit: Admitting: Family Medicine

## 2023-11-14 ENCOUNTER — Ambulatory Visit: Payer: Self-pay | Admitting: Family Medicine

## 2023-11-14 NOTE — Progress Notes (Signed)
 Left hip x-ray looks okay to radiology.  No significant hip arthritis.

## 2023-11-18 ENCOUNTER — Telehealth (HOSPITAL_COMMUNITY): Payer: Self-pay

## 2023-11-18 MED ORDER — LAMOTRIGINE 25 MG PO TABS: 25.0000 mg | ORAL_TABLET | Freq: Every day | ORAL | 0 refills | Status: DC

## 2023-11-18 MED ORDER — LAMOTRIGINE 100 MG PO TABS: 100.0000 mg | ORAL_TABLET | Freq: Every day | ORAL | 1 refills | Status: AC

## 2023-11-18 MED ORDER — VENLAFAXINE HCL ER 75 MG PO CP24: 225.0000 mg | ORAL_CAPSULE | Freq: Every day | ORAL | 0 refills | Status: DC

## 2023-11-18 NOTE — Telephone Encounter (Signed)
 received fax requesting a refill on the lamotrigine  25mg  and 100mg  and the venlafaxine . pt was last seen on 1-13 next appt 10-22

## 2023-11-18 NOTE — Telephone Encounter (Signed)
 pt called states that she does not have any more rx.

## 2023-11-18 NOTE — Telephone Encounter (Signed)
 left message asking if she had enough medications to get to her next appt on the 22nd. if she does not please call office back to let us  know.

## 2023-11-20 ENCOUNTER — Telehealth (INDEPENDENT_AMBULATORY_CARE_PROVIDER_SITE_OTHER): Admitting: Psychiatry

## 2023-11-20 ENCOUNTER — Encounter: Payer: Self-pay | Admitting: Family Medicine

## 2023-11-20 ENCOUNTER — Encounter (HOSPITAL_COMMUNITY): Payer: Self-pay | Admitting: Psychiatry

## 2023-11-20 DIAGNOSIS — F411 Generalized anxiety disorder: Secondary | ICD-10-CM | POA: Diagnosis not present

## 2023-11-20 DIAGNOSIS — F39 Unspecified mood [affective] disorder: Secondary | ICD-10-CM

## 2023-11-20 DIAGNOSIS — F331 Major depressive disorder, recurrent, moderate: Secondary | ICD-10-CM

## 2023-11-20 DIAGNOSIS — F0630 Mood disorder due to known physiological condition, unspecified: Secondary | ICD-10-CM

## 2023-11-20 DIAGNOSIS — F329 Major depressive disorder, single episode, unspecified: Secondary | ICD-10-CM

## 2023-11-20 DIAGNOSIS — G4700 Insomnia, unspecified: Secondary | ICD-10-CM

## 2023-11-20 DIAGNOSIS — F5102 Adjustment insomnia: Secondary | ICD-10-CM

## 2023-11-20 NOTE — Progress Notes (Signed)
 Patient ID: Brooke Kaiser, female   DOB: 21-Jun-1970, 53 y.o.   MRN: [id number] [facility] MD Progress Note  11/20/2023 12:34 PM Brooke Kaiser  MRN:  [id number]   Virtual Visit via Video Note  I connected with Brooke Kaiser on 11/20/23 at 12:30 PM EDT by a video enabled telemedicine application and verified that I am speaking with the correct person using two identifiers.  Location: Patient: home Provider: home office   I discussed the limitations of evaluation and management by telemedicine and the availability of in person appointments. The patient expressed understanding and agreed to proceed.      I discussed the assessment and treatment plan with the patient. The patient was provided an opportunity to ask questions and all were answered. The patient agreed with the plan and demonstrated an understanding of the instructions.   The patient was advised to call back or seek an in-person evaluation if the symptoms worsen or if the condition fails to improve as anticipated.  I provided 16 minutes of non-face-to-face time during this encounter.    HPI;  On evaluation patient is doing reasonable.  She is suffering from back concerns because of work-related injury so off work and getting treatment may be a nerve stimulator.  Other than relationship is going on reasonable although some concerns because patient mom is sick or having cancer and patient's mom lives with them.  She is trying to take care of her mom but is difficult to manage as she is argumentative   Modifying factor: relationship  Aggravating factor: Mom can be Severity of depression: Manageable  No associated psychotic symptoms or mania.  Principal Problem: GAD and MDD Diagnosis:   Patient Active Problem List   Diagnosis Date Noted   Hepatic steatosis [K76.0] 08/12/2023   Acute bilateral low back pain without sciatica [M54.50] 11/27/2021   Snoring [R06.83] 03/16/2021   Trigger middle finger of right hand [M65.331]  08/03/2020   Elevated hemoglobin [D58.2] 03/04/2018   Vitamin D  deficiency [E55.9] 03/04/2018   Gouty arthritis [M10.9] 11/04/2013   ANA positive [R76.89] 09/22/2013   Muscle ache [M79.10] 09/22/2013   Collagen disease [M35.9] 09/22/2013   Elevated erythrocyte sedimentation rate [R70.0] 09/22/2013   Type 2 diabetes mellitus with hyperglycemia (HCC) [E11.65] 06/24/2012   Asthmatic bronchitis [J45.909] 10/16/2011   GAD (generalized anxiety disorder) [F41.1] 07/30/2011   Essential hypertension [I10] 04/07/2010   UNSPECIFIED ANEMIA [D64.9] 03/29/2010   Severe episode of recurrent major depressive disorder (HCC) [F33.2] 07/22/2009   OTHER ABNORMAL GLUCOSE [R73.09] 05/25/2008   HYPERTRIGLYCERIDEMIA [E78.1] 11/04/2007   FATIGUE [R53.81, R53.83] 11/04/2007   External hemorrhoids [K64.4] 07/22/2007   Backache [M54.9] 07/22/2007   TOBACCO ABUSE [F17.200] 07/01/2007   GERD [K21.9] 07/01/2007   ABNORMAL THYROID  FUNCTION TESTS [R94.6] 04/22/2007   Total Time spent with patient: 25 minutes   Past Medical History:  Past Medical History:  Diagnosis Date   Abnormal thyroid  blood test    Anemia    Anxiety    Asthma    Depression    GERD (gastroesophageal reflux disease)    Hypertension    Tobacco abuse     Past Surgical History:  Procedure Laterality Date   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  9-05   DILATATION & CURETTAGE/HYSTEROSCOPY WITH MYOSURE N/A 04/12/2022   Procedure: DILATATION & CURETTAGE/HYSTEROSCOPY WITH MYOSURE;  Surgeon: Sudie Lavonia HERO, MD;  Location: [facility];  Service: Gynecology;  Laterality: N/A;   LAPAROSCOPIC OVARIAN CYSTECTOMY Right  04/12/2022   Procedure: LAPAROSCOPIC OVARIAN CYSTECTOMY; RIGHT OOPHORECTOMY;  Surgeon: Sudie Lavonia HERO, MD;  Location: [facility];  Service: Gynecology;  Laterality: Right;  1.5hrs in OR total per MD   LESION REMOVAL N/A 04/12/2022   Procedure: EXCISION VAGINAL LESION;  Surgeon:  Sudie Lavonia HERO, MD;  Location: [facility];  Service: Gynecology;  Laterality: N/A;   Family History:  Family History  Problem Relation Age of Onset   Cancer Mother 20       Breast   Colon polyps Mother    Other Mother        colon polyps   Dementia Mother    Anxiety disorder Mother    Depression Mother    Paranoid behavior Mother    Depression Father    Suicidality Father        commited suicide   Alcohol abuse Father    Colon polyps Maternal Aunt        cancerous colon polyps   Cancer Maternal Uncle        cancerous colon polyps   Heart disease Other    Other Other        thyroid  disorder   Alcohol abuse Paternal Uncle    Dementia Maternal Grandmother    Alcohol abuse Paternal Grandmother    Alcohol abuse Paternal Uncle    Social History:  Social History   Substance and Sexual Activity  Alcohol Use Yes   Alcohol/week: 0.3 standard drinks of alcohol   Comment: rarely 2-3 times per year     Social History   Substance and Sexual Activity  Drug Use No    Social History   Socioeconomic History   Marital status: Married    Spouse name: Not on file   Number of children: Not on file   Years of education: Not on file   Highest education level: Not on file  Occupational History   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    Employer: [employer]  Tobacco Use   Smoking status: Every Day    Current packs/day: 1.00    Average packs/day: 1 pack/day for 37.0 years (37.0 ttl pk-yrs)    Types: Cigarettes   Smokeless tobacco: Never   Tobacco comments:    1 pack a day   Vaping Use   Vaping status: Never Used  Substance and Sexual Activity   Alcohol use: Yes    Alcohol/week: 0.3 standard drinks of alcohol    Comment: rarely 2-3 times per year   Drug use: No   Sexual activity: Yes    Partners: Female    Comment: prefers female sex partner  Other Topics Concern   Not on file  Social History Narrative   Not on file   Social Drivers of Health    Financial Resource Strain: Not on file  Food Insecurity: Not on file  Transportation Needs: Not on file  Physical Activity: Not on file  Stress: Not on file  Social Connections: Not on file   Additional History:    Sleep: Fair  Appetite:  Fair   Assessment: GAD stable, MDD stable      Psychiatric Specialty Exam: Physical Exam  Review of Systems  Cardiovascular:  Negative for chest pain.  Skin:  Negative for rash.  Psychiatric/Behavioral:  Negative for hallucinations and suicidal ideas.     Last menstrual period 06/24/2013.There is no height or weight on file to calculate BMI.  General Appearance: Neat and Well Groomed  Eye Contact::  Good  Speech:  Clear and Coherent  Volume:  Normal  Mood: some better  Affect:  congruent  Thought Process:  Goal Directed, Intact, Linear and Logical  Orientation:  Full (Time, Place, and Person)  Thought Content:  WDL  Suicidal Thoughts:  No  Homicidal Thoughts:  No  Memory:  Immediate;   Good Recent;   Good Remote;   Good  Judgement:  Good  Insight:  Present  Psychomotor Activity:  Normal  Concentration:  Good  Recall:  Good  Fund of Knowledge:Good  Language: Good  Akathisia:  No  Handed:  Right  AIMS (if indicated):     Assets:  Communication Skills Desire for Improvement Housing Intimacy Leisure Time Physical Health Resilience Social Support Talents/Skills Transportation Vocational/Educational  ADL's:  Intact  Cognition: WNL  Sleep:  irregular sleep      Current Medications: Current Outpatient Prescriptions  Medication Sig Dispense Refill   lamoTRIgine  (LAMICTAL ) 100 MG tablet Take one tablet at day for mood stabilization. 30 tablet 1   losartan -hydrochlorothiazide (HYZAAR) 100-25 MG per tablet Take 1 tablet by mouth daily. 30 tablet 5   venlafaxine  XR (EFFEXOR -XR) 75 MG 24 hr capsule Take 3 capsules (225 mg total) by mouth daily with breakfast. 90 capsule 0   VENTOLIN  HFA 108 (90 BASE) MCG/ACT inhaler  INHALE 4 PUFFS INTO THE LUNGS 2 TIMES DAILY AS NEEDED. 18 Inhaler 3   No current facility-administered medications for this visit.    Lab Results:  No results found for this or any previous visit (from the past 48 hours).  Physical Findings: AIMS: CIWA:   COWS:    Treatment Plan Summary: Medication management Prior documentation reviewed   MDD : Manageable continue Lamictal  125 mg  Mood disorder: Manageable continue medications including Effexor   GAD: Stress related because of back concerns finances and her mom can be stressful but she is handling it continue Effexor    Insomnia: Continue sleep hygiene Medication reviewed questions addressed Fu  6 m.   Jackey Flight  12:34 PM 11/20/2023

## 2023-11-22 ENCOUNTER — Other Ambulatory Visit (HOSPITAL_COMMUNITY): Payer: Self-pay

## 2023-11-22 DIAGNOSIS — M48062 Spinal stenosis, lumbar region with neurogenic claudication: Secondary | ICD-10-CM

## 2023-12-02 ENCOUNTER — Encounter: Payer: Self-pay | Admitting: Radiology

## 2023-12-05 ENCOUNTER — Ambulatory Visit (HOSPITAL_COMMUNITY)
Admission: RE | Admit: 2023-12-05 | Discharge: 2023-12-05 | Disposition: A | Discharge status: Home / Self Care | Source: Ambulatory Visit

## 2023-12-05 DIAGNOSIS — M48062 Spinal stenosis, lumbar region with neurogenic claudication: Secondary | ICD-10-CM | POA: Insufficient documentation

## 2023-12-19 ENCOUNTER — Encounter: Payer: Self-pay | Admitting: Family Medicine

## 2023-12-19 DIAGNOSIS — G8929 Other chronic pain: Secondary | ICD-10-CM

## 2023-12-19 DIAGNOSIS — M47816 Spondylosis without myelopathy or radiculopathy, lumbar region: Secondary | ICD-10-CM

## 2023-12-19 NOTE — Telephone Encounter (Signed)
 Forwarding to Dr. Denyse Amass to review and advise.

## 2023-12-25 ENCOUNTER — Other Ambulatory Visit: Payer: Self-pay

## 2023-12-25 ENCOUNTER — Ambulatory Visit (HOSPITAL_BASED_OUTPATIENT_CLINIC_OR_DEPARTMENT_OTHER): Attending: Family Medicine | Admitting: Physical Therapy

## 2023-12-25 DIAGNOSIS — R262 Difficulty in walking, not elsewhere classified: Secondary | ICD-10-CM | POA: Insufficient documentation

## 2023-12-25 DIAGNOSIS — M25552 Pain in left hip: Secondary | ICD-10-CM | POA: Diagnosis present

## 2023-12-25 DIAGNOSIS — M6281 Muscle weakness (generalized): Secondary | ICD-10-CM | POA: Diagnosis present

## 2023-12-25 NOTE — Therapy (Signed)
 OUTPATIENT PHYSICAL THERAPY LOWER EXTREMITY EVALUATION   Patient Name: Brooke Kaiser MRN: [id number] DOB:01-08-1971, 53 y.o., female Today's Date: 12/26/2023  END OF SESSION:  PT End of Session - 12/25/23 1411     Visit Number 1    Number of Visits 10    Date for Recertification  02/05/24    Authorization Type MCD [employer]    PT Start Time 1411   Pt arrived late to treatment   PT Stop Time 1502    PT Time Calculation (min) 51 min    Activity Tolerance Patient tolerated treatment well    Behavior During Therapy [facility] for tasks assessed/performed          Past Medical History:  Diagnosis Date   Abnormal thyroid  blood test    Anemia    Anxiety    Asthma    Depression    GERD (gastroesophageal reflux disease)    Hypertension    Tobacco abuse    Past Surgical History:  Procedure Laterality Date   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  9-05   DILATATION & CURETTAGE/HYSTEROSCOPY WITH MYOSURE N/A 04/12/2022   Procedure: DILATATION & CURETTAGE/HYSTEROSCOPY WITH MYOSURE;  Surgeon: Sudie Lavonia HERO, MD;  Location: [facility];  Service: Gynecology;  Laterality: N/A;   LAPAROSCOPIC OVARIAN CYSTECTOMY Right 04/12/2022   Procedure: LAPAROSCOPIC OVARIAN CYSTECTOMY; RIGHT OOPHORECTOMY;  Surgeon: Sudie Lavonia HERO, MD;  Location: [facility];  Service: Gynecology;  Laterality: Right;  1.5hrs in OR total per MD   LESION REMOVAL N/A 04/12/2022   Procedure: EXCISION VAGINAL LESION;  Surgeon: Sudie Lavonia HERO, MD;  Location: [facility];  Service: Gynecology;  Laterality: N/A;   Patient Active Problem List   Diagnosis Date Noted   Hepatic steatosis 08/12/2023   Acute bilateral low back pain without sciatica 11/27/2021   Snoring 03/16/2021   Trigger middle finger of right hand 08/03/2020   Elevated hemoglobin 03/04/2018   Vitamin D  deficiency 03/04/2018   Gouty arthritis 11/04/2013   ANA positive 09/22/2013   Muscle ache  09/22/2013   Collagen disease 09/22/2013   Elevated erythrocyte sedimentation rate 09/22/2013   Type 2 diabetes mellitus with hyperglycemia (HCC) 06/24/2012   Asthmatic bronchitis 10/16/2011   GAD (generalized anxiety disorder) 07/30/2011   Essential hypertension 04/07/2010   UNSPECIFIED ANEMIA 03/29/2010   Severe episode of recurrent major depressive disorder (HCC) 07/22/2009   OTHER ABNORMAL GLUCOSE 05/25/2008   HYPERTRIGLYCERIDEMIA 11/04/2007   FATIGUE 11/04/2007   External hemorrhoids 07/22/2007   Backache 07/22/2007   TOBACCO ABUSE 07/01/2007   GERD 07/01/2007   ABNORMAL THYROID  FUNCTION TESTS 04/22/2007    REFERRING PROVIDER: Joane Artist RAMAN, MD   REFERRING DIAG: [phone] (ICD-10-CM) - Left hip pain   THERAPY DIAG:  Pain in left hip  Muscle weakness (generalized)  Difficulty in walking, not elsewhere classified  Rationale for Evaluation and Treatment: Rehabilitation  ONSET DATE: March 2025  SUBJECTIVE:   SUBJECTIVE STATEMENT: Pt's hip pain began in approx March with no specific MOI.  Her pain worsened 1 month prior to 10/9 MD appt and 1 month after.  She was having hip pain in anterior hip and groin.  Pt hasn't felt that hip pain since 1 month after seeing Dr. Joane.  Her hip pain would happen a lot or not at all.  Her L hip would become really weak and give out with gait.  She states she doesn't walk much due to her back.  Her hip  pain limited her walking also.  Pt had pain and weakness with standing and walking.   Pt states she is limited with activity and doesn't do much due to her back.  Her back interferes with lying, standing, walking, and sitting.  Pain starts in her back and travels to lateral hips and anterior thighs to her knees.  Her spouse helps her showering including standing in the shower.  Pt unable to change her bed and garden due to her back.  Pt had X-rays of left hip which showed minimal degenerative changes and no acute fractures per MD note.   Pt had  a lumbar MRI on 12/06/23.  Pt received PT for lumbar pain in 2023.  Pt states PT helped her body, but not her back.   PERTINENT HISTORY: Chronic back pain Diabetes type 2 HTN Depression and anxiety   PAIN:  Are you having pain? Yes: NPRS scale: 0/10 current and 8/10 worst ; 8/10 current, 10/10 worst, 6/10 best Pain location: L hip ; lumbar Pain description: constant Aggravating factors: see subjective above Relieving factors: lidocaine  roll-on, TENS, Ibuprofen  for lumbar; sitting down for hip  PRECAUTIONS: None    WEIGHT BEARING RESTRICTIONS: No  FALLS:  Has patient fallen in last 6 months? No  LIVING ENVIRONMENT: Lives with: lives with their family Lives in: 1 story home Stairs: 2 steps to enter home without a rail   OCCUPATION: Pt on long term disability.  PLOF: Independent with community mobility without device and Needs assistance with ADLs  PATIENT GOALS: to get her legs and hips moving.   OBJECTIVE:  Note: Objective measures were completed at Evaluation unless otherwise noted.  DIAGNOSTIC FINDINGS:  L hip x ray: FINDINGS: There is no evidence of hip fracture or dislocation. There is no evidence of arthropathy or other focal bone abnormality. Bony pelvis and hips are symmetric and intact. No diastasis. SI joints are maintained. Slight worsening degenerative changes of the lumbar spine spanning L3-S1. Obese body habitus.   IMPRESSION: 1. No acute finding by plain radiography. 2. Worsening lumbar degenerative changes.   Lumbar MRI: FINDINGS: There is stable alignment of the lumbar spine. Disc disease is identified at L4-5 and L5-S1. Mild-to-moderate facet arthrosis is present most notably at L4-5. Mild facet edema seen in the left of midline. There is no vertebral body height loss, subluxation or marrow replacing process. The sacrum and SI joints are unremarkable so far as visualized. Conus and cauda equina are unremarkable.   T12-L1: There is no focal  disc protrusion, foraminal or spinal stenosis.   L1-2: There is no focal disc protrusion, foraminal or spinal stenosis.   L2-3: Mild disc desiccation without focal protrusion, foraminal or spinal stenosis. Mild facet arthrosis.   L3-4: There is mild left foraminal bulge and mild facet arthrosis. No significant foraminal or spinal stenosis.   L4-5: There is a stable broad-based bulge and moderate to severe facet arthrosis. There is caudal foraminal narrowing, right greater than left. There is slight mass effect on the descending nerve roots in the right lateral recess. Moderate facet arthrosis is present.   L5-S1: There is a broad-based bulge and mild facet arthrosis. There is caudal from narrowing, left greater than right. This is relatively stable. Correlation for left L5 radiculopathy.   The retroperitoneal structures demonstrate no significant abnormality.   IMPRESSION: Relatively stable broad-based bulge at L4-5 and L5-S1.   This results in crowding of the descending nerve roots in the lateral recess, right greater than left at L4-5  and bilateral foraminal narrowing, left greater the right, at L5-S1. Correlation for radicular symptoms. See above for more detail at each individual level.  PATIENT SURVEYS:  LEFS:  6/80  COGNITION: Overall cognitive status: Within functional limits for tasks assessed       PALPATION: TTP:  R lateral hip, bilat ITB, bilat lumbar paraspinals  LOWER EXTREMITY ROM:   ROM Right eval Left eval  Hip flexion    Hip extension    Hip abduction    Hip adduction    Hip internal rotation    Hip external rotation    Knee flexion    Knee extension    Ankle dorsiflexion    Ankle plantarflexion    Ankle inversion    Ankle eversion     (Blank rows = not tested)  LOWER EXTREMITY MMT:  MMT Right eval Left eval  Hip flexion 4-/5 4/5  Hip extension    Hip abduction 13.4 16.3  Hip adduction    Hip internal rotation    Hip external  rotation    Knee flexion    Knee extension  4-/5 4/5  Ankle dorsiflexion    Ankle plantarflexion    Ankle inversion    Ankle eversion     (Blank rows = not tested)  LOWER EXTREMITY SPECIAL TESTS:  Ober's Test:  negative bilat though pt had lumbar pain FABER's Test:  Pt had no pain in hip though had lumbar pain with bilat sides. Limited ROM  FUNCTIONAL TESTS:  5x STS test:  58.2 sec with UE support.    GAIT: Assistive device utilized: None Level of assistance: Complete Independence Comments:  Antalgic gait.  decreased gait speed, bilat foot clearance, stiff lumbar, decreased arm swing.                                                                                                                                 TREATMENT:     PATIENT EDUCATION:  Education details: dx, relevant anatomy, objective findings, rationale of interventions, POC, and what to expect next treatment.  PT answered pt's questions.   Person educated: Patient Education method: Explanation Education comprehension: verbalized understanding and needs further education  HOME EXERCISE PROGRAM: Will give next visit.   ASSESSMENT:  CLINICAL IMPRESSION: Patient is a 53 y.o. female with a dx of L hip pain.  Pt has had x rays of L hip and lumbar MRI.  Her L hip is doing much better now.  Her hip pain is intermittent though hasn't experienced it recently.  She was having weakness in hip with sx's of her hip giving out with gait.  Her hip was limiting her walking, but she states she doesn't walk much due to her back.  Pt has chronic back pain and is very limited with activity due to her back.  Pt has gait deficits and weakness in bilat LE's R > L.  Pt has functional LE weakness and difficulty with  transfers as evidenced by time on the 5x STS test.  Pt has hip pain with performing steps to enter home.  Pt should benefit from skilled PT to address impairments and improve overall function.       OBJECTIVE IMPAIRMENTS:  Abnormal gait, decreased activity tolerance, decreased endurance, decreased mobility, difficulty walking, decreased ROM, decreased strength, and pain.   ACTIVITY LIMITATIONS: standing, stairs, transfers, bathing, and locomotion level  PARTICIPATION LIMITATIONS: cleaning, laundry, shopping, and community activity  PERSONAL FACTORS: 1-2 comorbidities: Chronic back pain and Type 2 DM are also affecting patient's functional outcome.   REHAB POTENTIAL: Good  CLINICAL DECISION MAKING: Stable/uncomplicated  EVALUATION COMPLEXITY: Low   GOALS:   SHORT TERM GOALS: Target date: 01/15/2024  Pt will be independent and compliant with HEP for improved pain, strength, and functional mobility.  Baseline: Goal status: INITIAL  2.  Pt will perform the 5x STS test in <49 sec for improved functional LE strength and performance of transfers.  Baseline:  58.2 sec Goal status: INITIAL  3.  Pt will demo at least a 9 pt increase in LEFS score for a clinically significant improvement in self perceived disability and function.  Baseline: 6/80 Goal status: INITIAL  4.  Pt's worst hip pain will be less than 6/10. Baseline: 8/10 Goal status: INITIAL    LONG TERM GOALS: Target date: 02/05/2024  Pt will be able perform steps to enter and exit home without increased hip pain and difficulty.   Baseline:  Goal status: INITIAL  2.  Pt will ambulate community distance without any feelings of her L hip wanting to give out and without significant L hip pain. Baseline:  Goal status: INITIAL  3.  Pt will demo improved L/R LE strength to 5/5 / 4+/5 MMT in hip flexion and knee extension and 26/23 lbs with HHD in abduction for improved tolerance with and performance of functional mobility.   Baseline:  Goal status: INITIAL  4.  Pt will perform the 5x STS test in <26 sec for improved functional LE strength and performance of transfers. Baseline:  Goal status: INITIAL    PLAN:  PT FREQUENCY:  1-2x/week  PT DURATION: 6 weeks  PLANNED INTERVENTIONS: 97164- PT Re-evaluation, 97750- Physical Performance Testing, 97110-Therapeutic exercises, 97530- Therapeutic activity, [id number]- Neuromuscular re-education, 97535- Self Care, [id number]- Manual therapy, [id number]- Gait training, [phone]- Aquatic Therapy, [phone]- Electrical stimulation (unattended), [phone]- Electrical stimulation (manual), [id number]- Ultrasound, 79439 (1-2 muscles), 20561 (3+ muscles)- Dry Needling, Patient/Family education, Stair training, Taping, Joint mobilization, Spinal mobilization, Cryotherapy, and Moist heat  PLAN FOR NEXT SESSION: Assess hip ROM.  Establish HEP.  Assess HS tightness.  Perform supine bridge and clamshell.   Leigh Minerva III PT, DPT 12/26/23 10:00 AM   For all possible CPT codes, reference the Planned Interventions line above.     Check all conditions that are expected to impact treatment: {Conditions expected to impact treatment:  Chronic LBP   If treatment provided at initial evaluation, no treatment charged due to lack of authorization.

## 2023-12-26 ENCOUNTER — Encounter (HOSPITAL_BASED_OUTPATIENT_CLINIC_OR_DEPARTMENT_OTHER): Payer: Self-pay | Admitting: Physical Therapy

## 2023-12-30 ENCOUNTER — Encounter: Payer: Self-pay | Admitting: Family Medicine

## 2024-01-01 ENCOUNTER — Encounter (HOSPITAL_BASED_OUTPATIENT_CLINIC_OR_DEPARTMENT_OTHER): Admitting: Physical Therapy

## 2024-01-08 ENCOUNTER — Encounter (HOSPITAL_BASED_OUTPATIENT_CLINIC_OR_DEPARTMENT_OTHER): Admitting: Physical Therapy

## 2024-01-09 ENCOUNTER — Ambulatory Visit (INDEPENDENT_AMBULATORY_CARE_PROVIDER_SITE_OTHER): Admitting: Family Medicine

## 2024-01-09 ENCOUNTER — Other Ambulatory Visit: Payer: Self-pay

## 2024-01-09 VITALS — BP 124/70 | HR 82 | Ht 68.0 in | Wt 259.0 lb

## 2024-01-09 DIAGNOSIS — M25552 Pain in left hip: Secondary | ICD-10-CM | POA: Diagnosis not present

## 2024-01-09 DIAGNOSIS — M47816 Spondylosis without myelopathy or radiculopathy, lumbar region: Secondary | ICD-10-CM | POA: Diagnosis not present

## 2024-01-09 NOTE — Progress Notes (Signed)
 LILLETTE Claretha Schimke am a scribe for Dr. Artist Lloyd.   Brooke Kaiser is a 53 y.o. female who presents to [facility] at [facility] today for left hip pain and chronic LBP. Pt was last seen by Dr. Lloyd on 11/07/23 for left hip pain. Suspected labrum dysfunction / tear vs hip flexor or rotator tendinoplasty, referred to PT. Advised if no improvement, plan for MRI or trial of injection. MRI L-Spine ordered by Dr. Darnella and performed on 12/05/23.   Today, pt reports that she hasn't had the pain since like she was here last. Pain comes and goes. Tried doing some exercises at the house. Not sure if that has helped it or not. Don't feel like she needs an injection in the hip today.   Dx imaging: 01/21/22 L-spine MRI  Pertinent review of systems: No fevers or chills  Relevant historical information: Hypertension.  Diabetes.   Exam:  BP 124/70   Pulse 82   Ht 5' 8 (1.727 m)   Wt 259 lb (117.5 kg)   LMP 06/24/2013   SpO2 95%   BMI 39.38 kg/m  General: Well Developed, well nourished, and in no acute distress.   MSK: L-spine decreased lumbar motion.  Lower extremity strength is intact.    Lab and Radiology Results  MR LUMBAR SPINE WITHOUT IV CONTRAST   COMPARISON: 01/21/2022   CLINICAL HISTORY: Low back pain.   TECHNIQUE: SAG T2, SAG T1, SAG STIR, AX T2, AX T1 without IV contrast.   FINDINGS: There is stable alignment of the lumbar spine. Disc disease is identified at L4-5 and L5-S1. Mild-to-moderate facet arthrosis is present most notably at L4-5. Mild facet edema seen in the left of midline. There is no vertebral body height loss, subluxation or marrow replacing process. The sacrum and SI joints are unremarkable so far as visualized. Conus and cauda equina are unremarkable.   T12-L1: There is no focal disc protrusion, foraminal or spinal stenosis.   L1-2: There is no focal disc protrusion, foraminal or spinal stenosis.   L2-3: Mild disc desiccation  without focal protrusion, foraminal or spinal stenosis. Mild facet arthrosis.   L3-4: There is mild left foraminal bulge and mild facet arthrosis. No significant foraminal or spinal stenosis.   L4-5: There is a stable broad-based bulge and moderate to severe facet arthrosis. There is caudal foraminal narrowing, right greater than left. There is slight mass effect on the descending nerve roots in the right lateral recess. Moderate facet arthrosis is present.   L5-S1: There is a broad-based bulge and mild facet arthrosis. There is caudal from narrowing, left greater than right. This is relatively stable. Correlation for left L5 radiculopathy.   The retroperitoneal structures demonstrate no significant abnormality.   IMPRESSION: Relatively stable broad-based bulge at L4-5 and L5-S1.   This results in crowding of the descending nerve roots in the lateral recess, right greater than left at L4-5 and bilateral foraminal narrowing, left greater the right, at L5-S1. Correlation for radicular symptoms. See above for more detail at each individual level.   Electronically signed by: Norleen Satchel MD 12/06/2023 05:21 PM EST RP Workstation: [id number] LILLETTE Artist Lloyd, personally (independently) visualized and performed the interpretation of the images attached in this note.    Assessment and Plan: 53 y.o. female with improved hip pain.  Chronic back pain.  Patient does have significant degenerative changes facet joints lumbar spine.  She is seeing neurosurgery soon to discuss surgical options.  She is already had  trials of facet injections etc. without much benefit. Reviewed MRI findings   PDMP not reviewed this encounter. No orders of the defined types were placed in this encounter.  No orders of the defined types were placed in this encounter.    Discussed warning signs or symptoms. Please see discharge instructions. Patient expresses understanding.   The above documentation has been  reviewed and is accurate and complete Artist Lloyd, M.D.

## 2024-01-15 ENCOUNTER — Encounter (HOSPITAL_BASED_OUTPATIENT_CLINIC_OR_DEPARTMENT_OTHER): Attending: Family Medicine | Admitting: Physical Therapy

## 2024-01-20 ENCOUNTER — Ambulatory Visit: Admitting: Family Medicine

## 2024-01-20 ENCOUNTER — Encounter: Payer: Self-pay | Admitting: Family Medicine

## 2024-01-20 VITALS — BP 134/72 | HR 76 | Ht 68.0 in | Wt 256.0 lb

## 2024-01-20 DIAGNOSIS — E1165 Type 2 diabetes mellitus with hyperglycemia: Secondary | ICD-10-CM

## 2024-01-20 DIAGNOSIS — J329 Chronic sinusitis, unspecified: Secondary | ICD-10-CM

## 2024-01-20 DIAGNOSIS — I10 Essential (primary) hypertension: Secondary | ICD-10-CM

## 2024-01-20 DIAGNOSIS — R1013 Epigastric pain: Secondary | ICD-10-CM | POA: Diagnosis not present

## 2024-01-20 MED ORDER — PANTOPRAZOLE SODIUM 20 MG PO TBEC: 20.0000 mg | DELAYED_RELEASE_TABLET | Freq: Every day | ORAL | 1 refills | Status: DC

## 2024-01-20 MED ORDER — DOXYCYCLINE HYCLATE 100 MG PO TABS: 100.0000 mg | ORAL_TABLET | Freq: Two times a day (BID) | ORAL | 0 refills | Status: AC

## 2024-01-20 NOTE — Progress Notes (Signed)
 "  Acute Office Visit  Patient ID: Brooke Kaiser, female    DOB: [birth year], 53 y.o.   MRN: [id number]  PCP: Alvan Dorothyann BIRCH, MD  Chief Complaint  Patient presents with   Gastroesophageal Reflux    Subjective:     HPI  Discussed the use of AI scribe software for clinical note transcription with the patient, who gave verbal consent to proceed.  History of Present Illness Brooke Kaiser is a 53 year old female who presents with epigastric pain and chronic sinus issues.  Epigastric pain - Duration of approximately two months - Located in the epigastric region above the umbilicus - Pain is sometimes constant for about an hour before subsiding - No radiation of pain to other areas - No association with specific food intake; occurs sporadically - Relief with milk and Tums, but symptoms not completely resolved - No blood in stool - No diarrhea - Occasional urgent need to use the bathroom - No nausea associated with pain, though occasional nausea occurs - Consumes approximately a twelve-cup pot of coffee daily  Chronic sinus symptoms - Duration of six months - Daily sinus pressure and headaches, primarily around the eyes - Headaches described as dull - Frequent ear popping occurring daily - History of sinus congestion - No prior treatment for sinus symptoms  Musculoskeletal pain - Uses lidocaine  stick for back pain  Medication use - Occasional use of ibuprofen , reduced from four to two tablets as needed - Does not regularly take Tylenol  - Recently started taking krill oil   ROS     Objective:    BP 134/72   Pulse 76   Ht 5' 8 (1.727 m)   Wt 256 lb 0.6 oz (116.1 kg)   LMP 06/24/2013   SpO2 95%   BMI 38.93 kg/m    Physical Exam Constitutional:      Appearance: Normal appearance.  HENT:     Head: Normocephalic and atraumatic.     Right Ear: Tympanic membrane, ear canal and external ear normal. There is no impacted cerumen.     Left Ear: Tympanic  membrane, ear canal and external ear normal. There is no impacted cerumen.     Nose: Nose normal.     Mouth/Throat:     Pharynx: Oropharynx is clear.  Eyes:     Conjunctiva/sclera: Conjunctivae normal.  Cardiovascular:     Rate and Rhythm: Normal rate and regular rhythm.  Pulmonary:     Effort: Pulmonary effort is normal.     Breath sounds: Normal breath sounds.  Abdominal:     General: Bowel sounds are normal. There is no distension.     Palpations: Abdomen is soft.     Tenderness: There is no abdominal tenderness.  Musculoskeletal:     Cervical back: Neck supple. No tenderness.  Lymphadenopathy:     Cervical: No cervical adenopathy.  Skin:    General: Skin is warm and dry.  Neurological:     Mental Status: She is alert and oriented to person, place, and time.  Psychiatric:        Mood and Affect: Mood normal.       No results found for any visits on 01/20/24.     Assessment & Plan:   Problem List Items Addressed This Visit       Cardiovascular and Mediastinum   Essential hypertension   Relevant Orders   CMP14+EGFR   CBC with Differential/Platelet   Hemoglobin A1c   Lipid panel  Endocrine   Type 2 diabetes mellitus with hyperglycemia (HCC) - Primary   Relevant Orders   CMP14+EGFR   CBC with Differential/Platelet   Hemoglobin A1c   Lipid panel   Other Visit Diagnoses       Epigastric pain       Relevant Medications   pantoprazole  (PROTONIX ) 20 MG tablet     Chronic sinusitis, unspecified location       Relevant Medications   doxycycline  (VIBRA -TABS) 100 MG tablet       Assessment and Plan Assessment & Plan Gastritis or peptic ulcer disease Chronic epigastric pain likely due to gastritis or peptic ulcer disease. High coffee consumption may contribute. Differential includes gastritis and peptic ulcer disease. - Ordered blood work to assess underlying causes. - Prescribed medication to reduce stomach acid for six weeks. - Advised reduction of  coffee intake to 3-4 pods per day, goal of 3 pods. - Provided handout on dietary modifications to avoid acidic and carbonated foods. - Encouraged use of decaffeinated coffee or tea as alternatives.  Chronic sinusitis Sinus issues with facial pressure, ear popping, and mild headaches suggest chronic sinusitis with possible inflammation and congestion. - Instructed on daily saline irrigation to clear nasal passages. - Prescribed Flonase , Nasonex , or Rhinocort, two squirts in each nostril after irrigation. - Prescribed an antibiotic to address potential bacterial infection. - Advised continuation of Flonase  for a few weeks post-antibiotic course. - Will consider referral to ENT if symptoms persist after treatment.  General Health Maintenance Routine health maintenance discussed, including mammogram and Pap smear scheduling. - Ensure mammogram and Pap smear are scheduled with gynecologist.    Meds ordered this encounter  Medications   pantoprazole  (PROTONIX ) 20 MG tablet    Sig: Take 1 tablet (20 mg total) by mouth daily. About 20  min before first meal of day    Dispense:  30 tablet    Refill:  1   doxycycline  (VIBRA -TABS) 100 MG tablet    Sig: Take 1 tablet (100 mg total) by mouth 2 (two) times daily.    Dispense:  20 tablet    Refill:  0    No follow-ups on file.  Dorothyann Byars, MD [facility] at [facility]   "

## 2024-01-21 ENCOUNTER — Ambulatory Visit: Payer: Self-pay | Admitting: Family Medicine

## 2024-01-21 DIAGNOSIS — E1165 Type 2 diabetes mellitus with hyperglycemia: Secondary | ICD-10-CM

## 2024-01-21 DIAGNOSIS — R748 Abnormal levels of other serum enzymes: Secondary | ICD-10-CM

## 2024-01-21 LAB — CMP14+EGFR
ALT: 108 IU/L — ABNORMAL HIGH (ref 0–32)
AST: 67 IU/L — ABNORMAL HIGH (ref 0–40)
Albumin: 4.3 g/dL (ref 3.8–4.9)
Alkaline Phosphatase: 66 IU/L (ref 49–135)
BUN/Creatinine Ratio: 13 (ref 9–23)
BUN: 10 mg/dL (ref 6–24)
Bilirubin Total: 0.5 mg/dL (ref 0.0–1.2)
CO2: 22 mmol/L (ref 20–29)
Calcium: 9.4 mg/dL (ref 8.7–10.2)
Chloride: 101 mmol/L (ref 96–106)
Creatinine, Ser: 0.75 mg/dL (ref 0.57–1.00)
Globulin, Total: 2.5 g/dL (ref 1.5–4.5)
Glucose: 140 mg/dL — ABNORMAL HIGH (ref 70–99)
Potassium: 4.7 mmol/L (ref 3.5–5.2)
Sodium: 137 mmol/L (ref 134–144)
Total Protein: 6.8 g/dL (ref 6.0–8.5)
eGFR: 95 mL/min/1.73

## 2024-01-21 LAB — CBC WITH DIFFERENTIAL/PLATELET
Basophils Absolute: 0.1 x10E3/uL (ref 0.0–0.2)
Basos: 1 %
EOS (ABSOLUTE): 0.3 x10E3/uL (ref 0.0–0.4)
Eos: 3 %
Hematocrit: 45.9 % (ref 34.0–46.6)
Hemoglobin: 15.8 g/dL (ref 11.1–15.9)
Immature Grans (Abs): 0 x10E3/uL (ref 0.0–0.1)
Immature Granulocytes: 0 %
Lymphocytes Absolute: 2.4 x10E3/uL (ref 0.7–3.1)
Lymphs: 26 %
MCH: 31.2 pg (ref 26.6–33.0)
MCHC: 34.4 g/dL (ref 31.5–35.7)
MCV: 91 fL (ref 79–97)
Monocytes Absolute: 0.8 x10E3/uL (ref 0.1–0.9)
Monocytes: 9 %
Neutrophils Absolute: 5.7 x10E3/uL (ref 1.4–7.0)
Neutrophils: 61 %
Platelets: 278 x10E3/uL (ref 150–450)
RBC: 5.06 x10E6/uL (ref 3.77–5.28)
RDW: 12.8 % (ref 11.7–15.4)
WBC: 9.4 x10E3/uL (ref 3.4–10.8)

## 2024-01-21 LAB — LIPID PANEL
Chol/HDL Ratio: 5.1 ratio — ABNORMAL HIGH (ref 0.0–4.4)
Cholesterol, Total: 172 mg/dL (ref 100–199)
HDL: 34 mg/dL — ABNORMAL LOW
LDL Chol Calc (NIH): 100 mg/dL — ABNORMAL HIGH (ref 0–99)
Triglycerides: 218 mg/dL — ABNORMAL HIGH (ref 0–149)
VLDL Cholesterol Cal: 38 mg/dL (ref 5–40)

## 2024-01-21 LAB — HEMOGLOBIN A1C
Est. average glucose Bld gHb Est-mCnc: 166 mg/dL
Hgb A1c MFr Bld: 7.4 % — ABNORMAL HIGH (ref 4.8–5.6)

## 2024-01-21 MED ORDER — TIRZEPATIDE 2.5 MG/0.5ML ~~LOC~~ SOAJ: 2.5000 mg | SUBCUTANEOUS | 0 refills | Status: DC

## 2024-01-21 NOTE — Progress Notes (Signed)
 Hi Shacara, your liver function has gone up and your A1C has gone up at well. How would you feel about going on one of the GLP 1 shots or pills to improve your sugar and decrease your appetite?  This would be in addition to your metformin . I would also like to do a liver US  and check labs for hepatitis.

## 2024-01-24 ENCOUNTER — Other Ambulatory Visit: Payer: Self-pay | Admitting: Family Medicine

## 2024-01-24 ENCOUNTER — Other Ambulatory Visit (HOSPITAL_COMMUNITY): Payer: Self-pay

## 2024-01-24 ENCOUNTER — Telehealth: Payer: Self-pay

## 2024-01-24 DIAGNOSIS — E1165 Type 2 diabetes mellitus with hyperglycemia: Secondary | ICD-10-CM

## 2024-01-24 NOTE — Telephone Encounter (Signed)
 Pharmacy Patient Advocate Encounter   Received notification from Physician's Office that prior authorization for Mounjaro  2.5MG /0.5ML auto-injectors  is required/requested.   Insurance verification completed.   The patient is insured through [employer].   Per test claim: PA required; PA submitted to above mentioned insurance via Latent Key/confirmation #/EOC [id number] Status is pending

## 2024-01-24 NOTE — Telephone Encounter (Signed)
 Per [facility] pharmacy - the Mounjaro  2.5 mg requires a prior authorization. Thanks in advance.

## 2024-01-27 ENCOUNTER — Telehealth: Payer: Self-pay

## 2024-01-27 ENCOUNTER — Other Ambulatory Visit (HOSPITAL_COMMUNITY): Payer: Self-pay

## 2024-01-27 MED ORDER — OZEMPIC (0.25 OR 0.5 MG/DOSE) 2 MG/3ML ~~LOC~~ SOPN: 0.2500 mg | PEN_INJECTOR | SUBCUTANEOUS | 1 refills | Status: AC

## 2024-01-27 NOTE — Telephone Encounter (Signed)
 Pharmacy Patient Advocate Encounter  Received notification from [employer]  that Prior Authorization for Mounjaro  2.5MG /0.5ML auto-injectors  has been DENIED.  Full denial letter will be uploaded to the media tab. See denial reason below.  Requires step therapy with two preferred medications: Byetta Pen, Ozempic  Pen, Trulicity Pen, or brand Victoza Pen.   PA #/Case ID/Reference #: [id number]

## 2024-01-27 NOTE — Telephone Encounter (Signed)
 Call pt: mounjaro  not covered, will switch to ozempic   Meds ordered this encounter  Medications   Semaglutide ,0.25 or 0.5MG /DOS, (OZEMPIC , 0.25 OR 0.5 MG/DOSE,) 2 MG/3ML SOPN    Sig: Inject 0.25 mg into the skin once a week. X 4 week then increase to 0.5mg  Qweek.    Dispense:  6 mL    Refill:  1

## 2024-01-27 NOTE — Progress Notes (Signed)
 "   Referring Physician:  Joane Artist RAMAN, MD [address] [place],  [place] [address]  Primary Physician:  Alvan Dorothyann BIRCH, MD  Discussed the use of AI scribe software for clinical note transcription with the patient, who gave verbal consent to proceed.  History of Present Illness Brooke Kaiser is a 53 year old female with low back and leg pain and diabetic polyneuropathy who presents for evaluation of chronic low back and bilateral thigh pain following a work-related injury.  She sustained a work-related back injury in October 2023, followed by constant low back pain radiating to the buttocks and wrapping down both thighs, worse on the right and not extending below the knees. She has associated thigh weakness, tingling, and numbness, with baseline pain 7-8/10. Facet injections over the past two years, including the most recent, have provided only brief partial relief. She has not had nerve conduction studies or EMG.  Lumbar MRI shows broad-based disc bulges at L4-5 and L5-S1 with right foraminal narrowing at both levels. Hip x-rays show no acute pathology. She has not had hip or thigh injections.  She has separate upper back pain that she considers distinct from her lower back and thigh symptoms.  She was diagnosed with diabetes about one year ago and has a recent hemoglobin A1c of 7.5. She has tingling in both hands, which she thinks may be carpal tunnel, and numbness in one or two toes on both feet. She notes frequent blinking that she associates with stress.  Conservative measures:  Physical therapy: Has not participated for back. Multimodal medical therapy including regular antiinflammatories: Lidocaine  cream, Ibuprofen  Injections: 08/31/2022 bilateral L4-5 and L5-S1 facet injections.    The symptoms are causing a significant impact on the patient's life.   I have utilized the care everywhere function in epic to review the outside records available from external health  systems.  Review of Systems:  A 10 point review of systems is negative, except for the pertinent positives and negatives detailed in the HPI.  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  9-05   DILATATION & CURETTAGE/HYSTEROSCOPY WITH MYOSURE N/A 04/12/2022   Procedure: DILATATION & CURETTAGE/HYSTEROSCOPY WITH MYOSURE;  Surgeon: Sudie Lavonia HERO, MD;  Location: [facility];  Service: Gynecology;  Laterality: N/A;   LAPAROSCOPIC OVARIAN CYSTECTOMY Right 04/12/2022   Procedure: LAPAROSCOPIC OVARIAN CYSTECTOMY; RIGHT OOPHORECTOMY;  Surgeon: Sudie Lavonia HERO, MD;  Location: [facility];  Service: Gynecology;  Laterality: Right;  1.5hrs in OR total per MD   LESION REMOVAL N/A 04/12/2022   Procedure: EXCISION VAGINAL LESION;  Surgeon: Sudie Lavonia HERO, MD;  Location: [facility];  Service: Gynecology;  Laterality: N/A;    Allergies: Allergies as of 02/03/2024 - Review Complete 02/03/2024  Allergen Reaction Noted   Amoxicillin -pot clavulanate Nausea And Vomiting 02/03/2024   Clavulanic acid Nausea And Vomiting 04/18/2012   Sanctura  [trospium ] Other (See Comments) 06/29/2015   Tape Hives, Itching, and Rash 04/09/2022    Medications: Current Medications[1]  Social History: Social History[2]  Family Medical History: Family History  Problem Relation Age of Onset  Cancer Mother 29       Breast   Colon polyps Mother    Other Mother        colon polyps   Dementia Mother    Anxiety disorder Mother    Depression Mother    Paranoid behavior Mother    Depression Father    Suicidality Father        commited suicide   Alcohol abuse  Father    Colon polyps Maternal Aunt        cancerous colon polyps   Cancer Maternal Uncle        cancerous colon polyps   Heart disease Other    Other Other        thyroid  disorder   Alcohol abuse Paternal Uncle    Dementia Maternal Grandmother    Alcohol abuse Paternal Grandmother    Alcohol abuse Paternal Uncle     Physical Examination: Vitals:   02/03/24 1306  BP: 118/78    General: Patient is in no apparent distress. Attention to examination is appropriate.  Neck:   Supple.  Full range of motion.  Respiratory: Patient is breathing without any difficulty.   NEUROLOGICAL:     Awake, alert, oriented to person, place, and time.  Speech is clear and fluent.   Cranial Nerves: Pupils equal round and reactive to light.  Facial tone is symmetric.  Facial sensation is symmetric. Shoulder shrug is symmetric. Tongue protrusion is midline.    Strength:  Side Iliopsoas Quads Hamstring PF DF EHL  R 5 5 5 5 5 5   L 5 5 5 5 5 5    No pathological reflexes noted.  Some mild blunting throughout her bilateral lower extremities as well as bilateral upper extremities.     No evidence of dysmetria noted.  Gait is normal.    Imaging: Lumbar spondylosis most notable at L4-L5 lower lumbar region.  No clear evidence of L1-L3 areas of compression.  I have personally reviewed the images and agree with the above interpretation.  Assessment and Plan Assessment & Plan Chronic low back pain with lumbar disc bulges Chronic low back pain following a work-related injury, refractory to multiple injections. MRI demonstrates broad-based, well-contained disc bulges at L4-5 and L5-S1 with right-sided foraminal narrowing, but no significant nerve compression or findings to explain anterior thigh symptoms. Spinal instability or excess motion remains a consideration. - Ordered lumbar flexion x-rays to assess for spinal instability or excess motion.  Evaluation for lumbar radiculopathy and peripheral  neuropathy Weakness, paresthesias, and pain in both thighs, with additional tingling in the hands and numbness in toes. Globally depressed reflexes suggest peripheral neuropathy, likely secondary to diabetes. MRI does not reveal evidence of lumbar radiculopathy, but further evaluation is indicated to clarify etiology. - Referred to neurology for EMG/NCS of all four extremities to evaluate for peripheral neuropathy and possible lumbar radiculopathy. - Instructed her to follow up if neurology does not contact her within one week.  Trochanteric bursitis versus tensor fascia lata syndrome Lateral hip and thigh pain with focal tenderness over the trochanteric bursa and tensor fascia lata region, consistent with possible trochanteric bursitis or tensor fascia lata syndrome. No prior injections in these areas. Diagnostic and therapeutic injection may aid in diagnosis and symptom relief. - Discussed possible referralfor diagnostic and therapeutic injection in the trochanteric bursa or tensor fascia lata region if indicated.   Penne MICAEL Sharps MD/MSCR Neurosurgery      [1]  Current Outpatient Medications:    albuterol  (VENTOLIN  HFA)  108 (90 Base) MCG/ACT inhaler, Inhale 2-4 puffs into the lungs every 6 (six) hours as needed for wheezing or shortness of breath. INHALE 2-4 PUFFS INTO THE LUNGS 2 TIMES DAILY AS NEEDED., Disp: 18 g, Rfl: 1   doxycycline  (VIBRA -TABS) 100 MG tablet, Take 1 tablet (100 mg total) by mouth 2 (two) times daily., Disp: 20 tablet, Rfl: 0   ibuprofen  (ADVIL ) 600 MG tablet, Take 1 tablet (600 mg total) by mouth every 6 (six) hours as needed. (Patient taking differently: Take 400 mg by mouth as needed.), Disp: 30 tablet, Rfl: 0   lamoTRIgine  (LAMICTAL ) 100 MG tablet, Take 1 tablet (100 mg total) by mouth daily., Disp: 90 tablet, Rfl: 1   lamoTRIgine  (LAMICTAL ) 25 MG tablet, Take 1 tablet (25 mg total) by mouth daily., Disp: 90 tablet, Rfl: 0   lidocaine  (LMX) 4 % cream, Apply 1  Application topically as needed (applies patch to lower back as needed, OTC)., Disp: , Rfl:    losartan  (COZAAR ) 100 MG tablet, Take 1 tablet (100 mg total) by mouth daily., Disp: 90 tablet, Rfl: 1   metFORMIN  (GLUCOPHAGE -XR) 500 MG 24 hr tablet, Take 1 tablet (500 mg total) by mouth 2 (two) times daily with a meal., Disp: 180 tablet, Rfl: 1   pantoprazole  (PROTONIX ) 20 MG tablet, Take 1 tablet (20 mg total) by mouth daily. About 20  min before first meal of day, Disp: 30 tablet, Rfl: 1   Semaglutide ,0.25 or 0.5MG /DOS, (OZEMPIC , 0.25 OR 0.5 MG/DOSE,) 2 MG/3ML SOPN, Inject 0.25 mg into the skin once a week. X 4 week then increase to 0.5mg  Qweek., Disp: 6 mL, Rfl: 1   venlafaxine  XR (EFFEXOR -XR) 75 MG 24 hr capsule, Take 3 capsules (225 mg total) by mouth daily with breakfast., Disp: 270 capsule, Rfl: 0   Vitamin D -Vitamin K (VITAMIN K2-VITAMIN D3 PO), Take 1 tablet by mouth daily., Disp: , Rfl:  [2]  Social History Tobacco Use   Smoking status: Every Day    Current packs/day: 1.00    Average packs/day: 1 pack/day for 37.0 years (37.0 ttl pk-yrs)    Types: Cigarettes   Smokeless tobacco: Never   Tobacco comments:    1 pack a day   Vaping Use   Vaping status: Never Used  Substance Use Topics   Alcohol use: Yes    Alcohol/week: 0.3 standard drinks of alcohol    Comment: rarely 2-3 times per year   Drug use: No   "

## 2024-01-27 NOTE — Addendum Note (Signed)
 Addended by: Quantay Zaremba D on: 01/27/2024 12:46 PM   Modules accepted: Orders

## 2024-01-27 NOTE — Telephone Encounter (Signed)
 Attempted call to patient. Left a voice mail message requesting a return call.

## 2024-01-27 NOTE — Telephone Encounter (Signed)
 Addressed in a different encounter by kim

## 2024-01-27 NOTE — Telephone Encounter (Signed)
 Patient had called back but I was not available to speak at the time of the call.  Called patient back and left my direct # for return call.

## 2024-01-27 NOTE — Telephone Encounter (Signed)
 Per [facility] - a prior authorization is required for Mounjaro . Thanks in advance.

## 2024-01-27 NOTE — Telephone Encounter (Signed)
 Copied from CRM [id number]. Topic: General - Other >> Jan 27, 2024  1:10 PM Berwyn MATSU wrote: Reason for CRM: patient called back returning Kim's call.   I called CAL per CAL anita Kim was on a call and she will let her know. I advised I will send a message as well.   I advised caller of the above.   May you please assist.

## 2024-01-27 NOTE — Telephone Encounter (Signed)
 Pharmacy Patient Advocate Encounter   Received notification from Onbase that prior authorization for Ozempic  (0.25 or 0.5 MG/DOSE) 2MG /3ML pen-injectors is required/requested.   Insurance verification completed.   The patient is insured through [facility] MEDICAID.   Per test claim: PA required; PA submitted to above mentioned insurance via Latent Key/confirmation #/EOC [id number] Status is pending

## 2024-01-28 ENCOUNTER — Other Ambulatory Visit (HOSPITAL_COMMUNITY): Payer: Self-pay

## 2024-01-28 NOTE — Telephone Encounter (Signed)
 Pharmacy Patient Advocate Encounter  Received notification from [facility] MEDICAID that Prior Authorization for Ozempic  (0.25 or 0.5 MG/DOSE) 2MG /3ML pen-injectors  has been APPROVED from 01/27/24 to 01/26/25. Unable to obtain price due to refill too soon rejection, last fill date 01/27/25 next available fill date 04/25/24   PA #/Case ID/Reference #: [id number]

## 2024-01-29 ENCOUNTER — Other Ambulatory Visit (HOSPITAL_COMMUNITY): Payer: Self-pay

## 2024-01-29 NOTE — Telephone Encounter (Signed)
 Pharmacy Patient Advocate Encounter   Received notification from [facility] MEDICAID that Prior Authorization for Ozempic  (0.25 or 0.5 MG/DOSE) 2MG /3ML pen-injectors  has been APPROVED from 01/27/24 to 01/26/25. Unable to obtain price due to refill too soon rejection, last fill date 01/27/25 next available fill date 04/25/24     Patient was sent this notification via Mychart by Jon Pear.

## 2024-01-31 ENCOUNTER — Ambulatory Visit

## 2024-01-31 DIAGNOSIS — R748 Abnormal levels of other serum enzymes: Secondary | ICD-10-CM

## 2024-02-03 ENCOUNTER — Ambulatory Visit: Admitting: Neurosurgery

## 2024-02-03 ENCOUNTER — Encounter: Payer: Self-pay | Admitting: Neurosurgery

## 2024-02-03 VITALS — BP 118/78 | Wt 249.8 lb

## 2024-02-03 DIAGNOSIS — M79651 Pain in right thigh: Secondary | ICD-10-CM | POA: Diagnosis not present

## 2024-02-03 DIAGNOSIS — E0842 Diabetes mellitus due to underlying condition with diabetic polyneuropathy: Secondary | ICD-10-CM

## 2024-02-03 DIAGNOSIS — M545 Low back pain, unspecified: Secondary | ICD-10-CM | POA: Diagnosis not present

## 2024-02-03 DIAGNOSIS — M79652 Pain in left thigh: Secondary | ICD-10-CM | POA: Diagnosis not present

## 2024-02-03 DIAGNOSIS — E1165 Type 2 diabetes mellitus with hyperglycemia: Secondary | ICD-10-CM

## 2024-02-03 DIAGNOSIS — G8929 Other chronic pain: Secondary | ICD-10-CM

## 2024-02-03 DIAGNOSIS — M5416 Radiculopathy, lumbar region: Secondary | ICD-10-CM

## 2024-02-03 DIAGNOSIS — M25559 Pain in unspecified hip: Secondary | ICD-10-CM

## 2024-02-04 ENCOUNTER — Ambulatory Visit: Payer: Self-pay | Admitting: Neurosurgery

## 2024-02-04 ENCOUNTER — Ambulatory Visit: Payer: Self-pay | Admitting: Family Medicine

## 2024-02-04 NOTE — Progress Notes (Signed)
 Hi Brooke Kaiser, ultrasound of liver is most consistent with fatty liver no new or worrisome changes but again we need to work on improving and reducing the fat content in the liver or this can lead to scarring and cirrhosis over time continue to work on healthy Mediterranean diet and regular exercise and trying to get your BMI under 35

## 2024-02-04 NOTE — Progress Notes (Signed)
 Attempted call to patient. Left a voice mail message requesting a return call.

## 2024-02-12 ENCOUNTER — Other Ambulatory Visit: Payer: Self-pay | Admitting: Family Medicine

## 2024-02-12 DIAGNOSIS — R1013 Epigastric pain: Secondary | ICD-10-CM

## 2024-02-16 ENCOUNTER — Other Ambulatory Visit (HOSPITAL_COMMUNITY): Payer: Self-pay | Admitting: Psychiatry

## 2024-02-19 ENCOUNTER — Encounter: Payer: Self-pay | Admitting: Neurology

## 2024-02-19 ENCOUNTER — Other Ambulatory Visit: Payer: Self-pay

## 2024-02-19 DIAGNOSIS — R202 Paresthesia of skin: Secondary | ICD-10-CM

## 2024-02-21 ENCOUNTER — Other Ambulatory Visit (HOSPITAL_COMMUNITY): Payer: Self-pay | Admitting: Psychiatry

## 2024-02-28 ENCOUNTER — Other Ambulatory Visit

## 2024-04-02 ENCOUNTER — Encounter: Payer: Self-pay | Admitting: Neurology

## 2024-04-16 ENCOUNTER — Encounter: Payer: Self-pay | Admitting: Neurology

## 2024-04-21 ENCOUNTER — Ambulatory Visit: Admitting: Family Medicine

## 2024-05-20 ENCOUNTER — Telehealth (HOSPITAL_COMMUNITY): Admitting: Psychiatry
